# Patient Record
Sex: Female | Born: 1950 | Race: White | Hispanic: No | Marital: Married | Smoking: Former smoker
Health system: Southern US, Community
[De-identification: ages and names within clinical notes are randomized; demographics above are authoritative.]

## PROBLEM LIST (undated history)

## (undated) DIAGNOSIS — Z803 Family history of malignant neoplasm of breast: Secondary | ICD-10-CM

## (undated) DIAGNOSIS — C50411 Malignant neoplasm of upper-outer quadrant of right female breast: Secondary | ICD-10-CM

## (undated) DIAGNOSIS — F32A Depression, unspecified: Secondary | ICD-10-CM

## (undated) DIAGNOSIS — M1990 Unspecified osteoarthritis, unspecified site: Secondary | ICD-10-CM

## (undated) DIAGNOSIS — I10 Essential (primary) hypertension: Secondary | ICD-10-CM

## (undated) DIAGNOSIS — C50919 Malignant neoplasm of unspecified site of unspecified female breast: Secondary | ICD-10-CM

## (undated) DIAGNOSIS — K449 Diaphragmatic hernia without obstruction or gangrene: Secondary | ICD-10-CM

## (undated) DIAGNOSIS — Z1379 Encounter for other screening for genetic and chromosomal anomalies: Secondary | ICD-10-CM

## (undated) DIAGNOSIS — G4733 Obstructive sleep apnea (adult) (pediatric): Secondary | ICD-10-CM

## (undated) DIAGNOSIS — G4730 Sleep apnea, unspecified: Secondary | ICD-10-CM

## (undated) DIAGNOSIS — I4892 Unspecified atrial flutter: Secondary | ICD-10-CM

## (undated) DIAGNOSIS — C801 Malignant (primary) neoplasm, unspecified: Secondary | ICD-10-CM

## (undated) DIAGNOSIS — Z170 Estrogen receptor positive status [ER+]: Secondary | ICD-10-CM

## (undated) DIAGNOSIS — K219 Gastro-esophageal reflux disease without esophagitis: Secondary | ICD-10-CM

## (undated) DIAGNOSIS — F419 Anxiety disorder, unspecified: Secondary | ICD-10-CM

## (undated) DIAGNOSIS — I480 Paroxysmal atrial fibrillation: Secondary | ICD-10-CM

## (undated) DIAGNOSIS — E7800 Pure hypercholesterolemia, unspecified: Secondary | ICD-10-CM

## (undated) DIAGNOSIS — E0590 Thyrotoxicosis, unspecified without thyrotoxic crisis or storm: Secondary | ICD-10-CM

## (undated) DIAGNOSIS — G3184 Mild cognitive impairment, so stated: Secondary | ICD-10-CM

## (undated) DIAGNOSIS — E785 Hyperlipidemia, unspecified: Secondary | ICD-10-CM

## (undated) DIAGNOSIS — I471 Supraventricular tachycardia, unspecified: Secondary | ICD-10-CM

## (undated) DIAGNOSIS — F329 Major depressive disorder, single episode, unspecified: Secondary | ICD-10-CM

## (undated) DIAGNOSIS — L239 Allergic contact dermatitis, unspecified cause: Secondary | ICD-10-CM

## (undated) DIAGNOSIS — I4819 Other persistent atrial fibrillation: Secondary | ICD-10-CM

## (undated) DIAGNOSIS — I4891 Unspecified atrial fibrillation: Secondary | ICD-10-CM

## (undated) DIAGNOSIS — I495 Sick sinus syndrome: Secondary | ICD-10-CM

## (undated) DIAGNOSIS — I340 Nonrheumatic mitral (valve) insufficiency: Secondary | ICD-10-CM

## (undated) HISTORY — DX: Paroxysmal atrial fibrillation: I48.0

## (undated) HISTORY — DX: Anxiety disorder, unspecified: F41.9

## (undated) HISTORY — DX: Other persistent atrial fibrillation: I48.19

## (undated) HISTORY — DX: Depression, unspecified: F32.A

## (undated) HISTORY — PX: TONSILLECTOMY: SUR1361

## (undated) HISTORY — DX: Supraventricular tachycardia, unspecified: I47.10

## (undated) HISTORY — DX: Hyperlipidemia, unspecified: E78.5

## (undated) HISTORY — DX: Major depressive disorder, single episode, unspecified: F32.9

## (undated) HISTORY — DX: Unspecified atrial flutter: I48.92

## (undated) HISTORY — DX: Encounter for other screening for genetic and chromosomal anomalies: Z13.79

## (undated) HISTORY — DX: Nonrheumatic mitral (valve) insufficiency: I34.0

## (undated) HISTORY — PX: TUBAL LIGATION: SHX77

## (undated) HISTORY — DX: Obstructive sleep apnea (adult) (pediatric): G47.33

## (undated) HISTORY — DX: Diaphragmatic hernia without obstruction or gangrene: K44.9

## (undated) HISTORY — PX: APPENDECTOMY: SHX54

## (undated) HISTORY — DX: Estrogen receptor positive status (ER+): Z17.0

## (undated) HISTORY — DX: Sick sinus syndrome: I49.5

## (undated) HISTORY — DX: Malignant neoplasm of unspecified site of unspecified female breast: C50.919

## (undated) HISTORY — DX: Allergic contact dermatitis, unspecified cause: L23.9

## (undated) HISTORY — DX: Gastro-esophageal reflux disease without esophagitis: K21.9

## (undated) HISTORY — PX: CATARACT EXTRACTION: SUR2

## (undated) HISTORY — DX: Supraventricular tachycardia: I47.1

## (undated) HISTORY — DX: Morbid (severe) obesity due to excess calories: E66.01

## (undated) HISTORY — DX: Mild cognitive impairment of uncertain or unknown etiology: G31.84

## (undated) HISTORY — DX: Essential (primary) hypertension: I10

## (undated) HISTORY — DX: Family history of malignant neoplasm of breast: Z80.3

## (undated) HISTORY — DX: Malignant neoplasm of upper-outer quadrant of right female breast: C50.411

---

## 1994-08-12 HISTORY — PX: DILATION AND CURETTAGE OF UTERUS: SHX78

## 1999-03-02 ENCOUNTER — Ambulatory Visit (HOSPITAL_COMMUNITY): Admission: RE | Admit: 1999-03-02 | Discharge: 1999-03-02 | Payer: Self-pay | Admitting: Internal Medicine

## 1999-03-02 ENCOUNTER — Encounter: Payer: Self-pay | Admitting: Internal Medicine

## 1999-03-27 ENCOUNTER — Ambulatory Visit (HOSPITAL_COMMUNITY): Admission: RE | Admit: 1999-03-27 | Discharge: 1999-03-27 | Payer: Self-pay | Admitting: Psychiatry

## 2000-11-13 ENCOUNTER — Ambulatory Visit (HOSPITAL_COMMUNITY): Admission: RE | Admit: 2000-11-13 | Discharge: 2000-11-13 | Payer: Self-pay | Admitting: Internal Medicine

## 2000-11-13 ENCOUNTER — Encounter: Payer: Self-pay | Admitting: Internal Medicine

## 2002-02-05 ENCOUNTER — Emergency Department (HOSPITAL_COMMUNITY): Admission: EM | Admit: 2002-02-05 | Discharge: 2002-02-05 | Payer: Self-pay | Admitting: Emergency Medicine

## 2003-08-16 ENCOUNTER — Encounter: Admission: RE | Admit: 2003-08-16 | Discharge: 2003-09-07 | Payer: Self-pay | Admitting: Internal Medicine

## 2003-09-14 ENCOUNTER — Encounter: Admission: RE | Admit: 2003-09-14 | Discharge: 2003-09-14 | Payer: Self-pay | Admitting: Internal Medicine

## 2005-08-19 ENCOUNTER — Encounter: Admission: RE | Admit: 2005-08-19 | Discharge: 2005-08-19 | Payer: Self-pay | Admitting: Internal Medicine

## 2005-12-30 ENCOUNTER — Inpatient Hospital Stay (HOSPITAL_COMMUNITY): Admission: EM | Admit: 2005-12-30 | Discharge: 2006-01-02 | Payer: Self-pay | Admitting: Emergency Medicine

## 2005-12-30 ENCOUNTER — Encounter (INDEPENDENT_AMBULATORY_CARE_PROVIDER_SITE_OTHER): Payer: Self-pay | Admitting: Cardiology

## 2006-10-13 ENCOUNTER — Inpatient Hospital Stay (HOSPITAL_COMMUNITY): Admission: AD | Admit: 2006-10-13 | Discharge: 2006-10-18 | Payer: Self-pay | Admitting: Cardiology

## 2006-10-15 ENCOUNTER — Encounter (INDEPENDENT_AMBULATORY_CARE_PROVIDER_SITE_OTHER): Payer: Self-pay | Admitting: Cardiology

## 2007-01-12 ENCOUNTER — Encounter: Admission: RE | Admit: 2007-01-12 | Discharge: 2007-01-12 | Payer: Self-pay | Admitting: Endocrinology

## 2010-10-01 ENCOUNTER — Emergency Department (HOSPITAL_COMMUNITY): Payer: BC Managed Care – PPO

## 2010-10-01 ENCOUNTER — Emergency Department (HOSPITAL_COMMUNITY)
Admission: EM | Admit: 2010-10-01 | Discharge: 2010-10-01 | Disposition: A | Discharge status: Home / Self Care | Payer: BC Managed Care – PPO | Attending: Emergency Medicine | Admitting: Emergency Medicine

## 2010-10-01 DIAGNOSIS — M545 Low back pain, unspecified: Secondary | ICD-10-CM | POA: Insufficient documentation

## 2010-10-01 DIAGNOSIS — Z79899 Other long term (current) drug therapy: Secondary | ICD-10-CM | POA: Insufficient documentation

## 2010-10-01 DIAGNOSIS — M542 Cervicalgia: Secondary | ICD-10-CM | POA: Insufficient documentation

## 2010-10-01 DIAGNOSIS — R079 Chest pain, unspecified: Secondary | ICD-10-CM | POA: Insufficient documentation

## 2010-10-01 DIAGNOSIS — M25569 Pain in unspecified knee: Secondary | ICD-10-CM | POA: Insufficient documentation

## 2010-10-01 DIAGNOSIS — S8000XA Contusion of unspecified knee, initial encounter: Secondary | ICD-10-CM | POA: Insufficient documentation

## 2010-10-01 DIAGNOSIS — E0590 Thyrotoxicosis, unspecified without thyrotoxic crisis or storm: Secondary | ICD-10-CM | POA: Insufficient documentation

## 2010-10-01 DIAGNOSIS — I10 Essential (primary) hypertension: Secondary | ICD-10-CM | POA: Insufficient documentation

## 2010-10-01 DIAGNOSIS — I4891 Unspecified atrial fibrillation: Secondary | ICD-10-CM | POA: Insufficient documentation

## 2010-10-01 DIAGNOSIS — Y929 Unspecified place or not applicable: Secondary | ICD-10-CM | POA: Insufficient documentation

## 2010-10-01 DIAGNOSIS — Z7901 Long term (current) use of anticoagulants: Secondary | ICD-10-CM | POA: Insufficient documentation

## 2010-10-01 LAB — BASIC METABOLIC PANEL
BUN: 12 mg/dL (ref 6–23)
CO2: 27 mEq/L (ref 19–32)
Chloride: 102 mEq/L (ref 96–112)
Creatinine, Ser: 0.7 mg/dL (ref 0.4–1.2)
GFR calc non Af Amer: >60 mL/min (ref >60)
Potassium: 4.3 mEq/L (ref 3.5–5.1)

## 2010-10-01 LAB — CBC
HCT: 43.9 % (ref 36.0–46.0)
Hemoglobin: 14.7 g/dL (ref 12.0–15.0)
MCH: 30.6 pg (ref 26.0–34.0)
Platelets: 220 10*3/uL (ref 150–400)
RBC: 4.81 MIL/uL (ref 3.87–5.11)

## 2010-10-01 LAB — PROTIME-INR: INR: 1.85 — ABNORMAL HIGH (ref 0.00–1.49)

## 2010-11-11 HISTORY — PX: HYSTEROSCOPY: SHX211

## 2010-11-30 ENCOUNTER — Encounter (HOSPITAL_COMMUNITY)
Admission: RE | Admit: 2010-11-30 | Discharge: 2010-11-30 | Disposition: A | Discharge status: Home / Self Care | Payer: BC Managed Care – PPO | Source: Ambulatory Visit | Attending: Obstetrics & Gynecology | Admitting: Obstetrics & Gynecology

## 2010-11-30 LAB — CBC
HCT: 42.5 % (ref 36.0–46.0)
MCH: 30 pg (ref 26.0–34.0)
MCHC: 32.2 g/dL (ref 30.0–36.0)
MCV: 93.2 fL (ref 78.0–100.0)
Platelets: 231 10*3/uL (ref 150–400)
WBC: 11.8 10*3/uL — ABNORMAL HIGH (ref 4.0–10.5)

## 2010-11-30 LAB — BASIC METABOLIC PANEL
CO2: 28 mEq/L (ref 19–32)
Chloride: 102 mEq/L (ref 96–112)
GFR calc Af Amer: >60 mL/min (ref >60)
Potassium: 3.7 mEq/L (ref 3.5–5.1)

## 2010-11-30 LAB — PROTIME-INR: Prothrombin Time: 23.6 seconds — ABNORMAL HIGH (ref 11.6–15.2)

## 2010-12-04 ENCOUNTER — Ambulatory Visit (HOSPITAL_COMMUNITY)
Admission: RE | Admit: 2010-12-04 | Discharge: 2010-12-04 | Disposition: A | Discharge status: Home / Self Care | Payer: BC Managed Care – PPO | Source: Ambulatory Visit | Attending: Obstetrics & Gynecology | Admitting: Obstetrics & Gynecology

## 2010-12-04 ENCOUNTER — Other Ambulatory Visit: Payer: Self-pay | Admitting: Obstetrics & Gynecology

## 2010-12-04 DIAGNOSIS — Z01812 Encounter for preprocedural laboratory examination: Secondary | ICD-10-CM | POA: Insufficient documentation

## 2010-12-04 DIAGNOSIS — I4891 Unspecified atrial fibrillation: Secondary | ICD-10-CM | POA: Insufficient documentation

## 2010-12-04 DIAGNOSIS — I10 Essential (primary) hypertension: Secondary | ICD-10-CM | POA: Insufficient documentation

## 2010-12-04 DIAGNOSIS — N882 Stricture and stenosis of cervix uteri: Secondary | ICD-10-CM | POA: Insufficient documentation

## 2010-12-04 DIAGNOSIS — Z01818 Encounter for other preprocedural examination: Secondary | ICD-10-CM | POA: Insufficient documentation

## 2010-12-04 DIAGNOSIS — N950 Postmenopausal bleeding: Secondary | ICD-10-CM | POA: Insufficient documentation

## 2010-12-10 NOTE — Op Note (Signed)
NAME:  Courtney Dunlap, Courtney Dunlap               ACCOUNT NO.:  [id number]  MEDICAL RECORD NO.:  [id number]           PATIENT TYPE:  O  LOCATION:  [facility]                          [facility]  PHYSICIAN:  M. Leda Quail, MD  DATE OF BIRTH:  [birth year]  DATE OF PROCEDURE: DATE OF DISCHARGE:                              OPERATIVE REPORT   PREOPERATIVE DIAGNOSES: 32. A 60 year old gravida 2, para 2, married white female with     postmenopausal bleeding. 2. Stenotic cervix with failed endometrial biopsy in the office     attempt x2. 3. History of atrial fibrillation with current regular rate and     rhythm, on medications. 4. History of Coumadin use for prophylaxis, off x4 days. 5. Gastroesophageal reflux disease. 6. Hyperthyroidism. 7. Hypertension.  POSTOPERATIVE DIAGNOSES: 36. A 60 year old gravida 2, para 2, married white female with     postmenopausal bleeding. 2. Stenotic cervix with failed endometrial biopsy in the office     attempt x2. 3. History of atrial fibrillation with current regular rate and     rhythm, on medications. 4. History of Coumadin use for prophylaxis, off x4 days. 5. Gastroesophageal reflux disease. 6. Hyperthyroidism. 7. Hypertension.  PROCEDURE:  Hysteroscopy D and C.  SURGEON:  M. Leda Quail, MD  ASSISTANT:  OR staff.  ANESTHESIA:  Quillian Quince, MD oversaw the case.  LMA was used for anesthesia.  FINDINGS:  Fluffy endometrial tissue noted.  No polyps, no fibroids.  SPECIMEN:  Endometrial curettings sent to pathology.  ESTIMATED BLOOD LOSS:  Minimal.  FLUIDS:  700 mL of LR.  URINE OUTPUT:  50 mL drained with an I and O catheter at the beginning of the procedure.  FLUID DEFICIT:  45 mL of 1.5% glycine.  COMPLICATIONS:  None.  INDICATIONS:  Courtney Dunlap is a 60 year old, G2, P2 married white female, who has history of postmenopausal bleeding.  I attempted to do a biopsy in the office x2, but due to her stenotic cervix and discomfort I  had been unsuccessful.  The first time I attempted this was when she came in for her annual exam and on exam, I saw evidence of postmenopausal bleeding as well as per her history.  The biopsy was unsuccessful this day due to the stenosis and her discomfort, so I gave Cytotec and returned 2 days later and again due to her discomfort and the stenosis I was unable to dilate the cervix adequately enough to get endometrial biopsy in the office.  I recommended go ahead and doing this in the operating room despite some are elected by the patient as very comfortable if she was having postmenopausal bleeding, but some darkish blood noted at the cervical os on the last 2 exams that I have done on her.  Risks and benefits have been explained to the patient.  She consented and presented for the procedure today.  PROCEDURE:  The patient was taken to the operating room.  She was placed in supine position.  Anesthesia was administered by the anesthesia staff without difficulty.  She has running IV in place.  Informed consent was present on the chart.  Her legs were positioned in the low lithotomy position in Mertens stirrups with SCDs on her lower extremities bilaterally.  The legs were then lifted to the high lithotomy position. Time-out was performed.  The Betadine prep was used to prep the skin, perineum, inner thighs and vagina.  She was draped in normal sterile fashion.  A latex-free catheter was used to drain the bladder of all urine.  A bivalve speculum was placed in the vagina.  The anterior lip of the cervix was grasped with single-tooth tenaculum.  Using lacrimal duct probes, a #1 lacrimal duct probe was passed through the tiny opening in the cervix where a dark clot was passing through.  This was passed without difficulty.  Lacrimal duct probes were then used to dilate the endocervical canal until a #6 was reached.  Then, a #1 Hegar dilator was passed through the endocervical canal to the  fundus of the uterus.  The uterus sounds about 7 cm.  Hegar dilators were used and dilated up to a #8.  Then, 3.9-mm diagnostic hysteroscope was obtained. A 1.5% glycine was used to the hysteroscopic fluid for the procedure. This was passed through the endocervical canal and the endometrial cavity was noted.  There was a fluffy endometrial tissue noted.  The tubal ostia on the right is noted.  The tubal ostia on the left was scarred over.  Photo documentation made.  There was no polyp or evidence of fibroids.  The hysteroscope was removed and then #1 toothed curette was used to curette the cavity until a rough gritty texture was noted in all quadrants.  The hysteroscope was replaced through the endocervical canal and the endometrial cavity was visualized.  A thin-appearing endometrium at this time with a good curetting noted.  Photo documentation was made.  At this point, the hysteroscope was removed. There was 45 mL fluid deficit.  The tenaculum was removed from the anterior lip of the cervix.  A paracervical block of 1% Xylocaine without epinephrine was used to begin the procedure, 10 mL total was used.  There was no bleeding from the tenaculum site or from the sites of the paracervical block.  The speculum was removed at this point.  The prep was cleansed off the skin.  The legs positioned back in supine position.  Sponge, lap, needle and sponge counts were correct x2.  The patient awakened from anesthesia and taken to recovery room in stable condition.  The endometrial curettings were sent to pathology.     Lum Keas, MD     MSM/MEDQ  D:  12/04/2010  T:  12/05/2010  Job:  [id number]  Electronically Signed by Paul Half MD on 12/10/2010 09:50:31 AM

## 2010-12-28 NOTE — Discharge Summary (Signed)
NAME:  Courtney Dunlap, Courtney Dunlap               ACCOUNT NO.:  [id number]   MEDICAL RECORD NO.:  [id number]          PATIENT TYPE:  INP   LOCATION:  2920                         FACILITY:  [facility]   PHYSICIAN:  Armanda Magic, M.D.     DATE OF BIRTH:  1951/03/23   DATE OF ADMISSION:  10/13/2006  DATE OF DISCHARGE:  10/18/2006                               DISCHARGE SUMMARY   ADMISSION DIAGNOSES:  Palpitations secondary to atrial flutter with  rapid ventricular response.   DISCHARGE DIAGNOSES:  1. Status post conversion to normal sinus rhythm.  2. Hyperthyroidism.  3. Gastroesophageal reflux disease.  4. Hypertension.  5. Dyslipidemia.  6. History of shingles to the right upper back.  7. Systemic anticoagulation with Coumadin therapy.  8. Normal left ventricular function with an ejection fraction of 60%      via 2-D echocardiogram March 2008.  9. Allergy to codeine.  10.Intolerant to Daypro - angioedema.  11.Leukocytosis questionable etiology.   CONSULTATION:  Endocrinology by Dr. Adrian Prince.   PROCEDURE:  None.   HOSPITAL COURSE:  Courtney Dunlap is a 60 year old female with a known  history of atrial flutter with RVR.  She has been hospitalized in the  past for this problem and has been treated with IV Cardizem as well as  digoxin and Lopressor.  She has been maintaining normal sinus rhythm.  However complained of palpitations and was fitted with an event monitor  which revealed atrial flutter with 1:1 ratio with a heart rate up to the  280s.  She was admitted to the [facility] on October 13, 2006  secondary to atrial flutter with RVR.  She was started on IV Cardizem.  TSH, free T3 and free T4 were measured and endocrinology consult was  called secondary to severe hyperthyroidism.  She was continued on PTU as  well as digoxin and metoprolol during this admission.  She was also  continued on systemic anticoagulation with Coumadin therapy per pharmacy  protocol.  12-lead EKG upon  arrival revealed atrial flutter with RVR  with a ventricular rate of 128 beats per minute.  There was no evidence  of acute ST-segment/T-wave changes.  2-D echocardiogram during this  admission revealed normal LV systolic function with an EF of 50-60%.  There was no evidence of regional wall motion abnormalities.  Serial  cardiac enzymes were mildly elevated secondary to atrial flutter with a  peak troponin of 0.24 trending downward to 0.12.  BNP was nondiagnostic  at 283. During the admission, the patient converted to a normal rhythm  which was confirmed via EKG on October 17, 2006 with a ventricular rate of  65 beats per minute.  There was no evidence of acute ST-segment/T-wave  changes.  After being seen by endocrinology the patient's PTU was  increased to 200 mg three times daily.  The patient's IV Cardizem was  changed to 90 mg every 6 hours and then drip was discontinued.  The  patient was switched to oral Cardizem at 180 mg daily.  She was  discharged to home in stable condition on October 18, 2006 with  no  complaints of palpitations, dizziness, fatigue, chest pain, or shortness  of breath.   LABORATORY DATA:  Serial cardiac enzymes; CK total 52, 46, 36, and 29,  CK-MB 3.9, 3.4, 2.7, and 1.7.  Troponin I 0.33, 0.24, 0.12 x2.  BNP  283.0. Free T4 2.14.  TSH 0.006, T3 260.0, total T3 by RIA 172.  Magnesium 2.0. PT 25.4, INR 2.2, PTT 44, total bilirubin 0.7, alkaline  phosphatase 138, AST 33, ALT 48, total protein 7.0, serum albumin 3.5,  calcium 9.6, digoxin level 1.6. White blood count 13.0, hemoglobin 15.7,  hematocrit 46.2, platelets 328,000. Sodium 136, potassium 3.5, chloride  102, CO2 25, glucose 86, BUN 15, creatinine 0.76. Repeat free T4 1.75.  Repeat TSH 0.012.  Repeat T3 total by RIA 77.   X-RAYS:  None during this admission.   EKGS:  As stated in the hospital course.   CONDITION ON DISCHARGE:  Stable.   DISCHARGE MEDICATIONS:  1. Digoxin 0.25 mg daily.  2. Coumadin take  as directed.  3. Lopressor 100 mg twice daily.  This represented an increase in      dosage. A prescription was given with refills.  4. PTU 100 mg three times daily.  This represented an increase in      dosage. Prescription was given with refills.   DISCHARGE INSTRUCTIONS:  1. Discontinue Cardizem.  2. No restrictions upon activity.  3. Stop any activity that causes chest pain, shortness of breath,      dizziness, sweating, or excessive weakness.  4. Continue a low-sodium, low-fat, and low-cholesterol diet.   FOLLOW-UP APPOINTMENT:  1. Appointment has been scheduled at the [facility] for PT/INR check on October 20, 2006 at 9:30 a.m.  2. Follow-up appointment with Armanda Magic, M.D. on March 19 to 8:45      a.m.  The patient is scheduled to call [phone] if she is unable to      make the scheduled appointment.  3. Call Dr. Rinaldo Cloud office for an appointment in 2 weeks.      Tylene Fantasia, [place]      Armanda Magic, M.D.  Electronically Signed    RDM/MEDQ  D:  12/02/2006  T:  12/02/2006  Job:  [id number]   cc:   Jeannett Senior A. Evlyn Kanner, M.D.  Georgann Housekeeper, MD

## 2010-12-28 NOTE — Consult Note (Signed)
NAME:  Courtney Dunlap, Courtney Dunlap               ACCOUNT NO.:  [id number]   MEDICAL RECORD NO.:  [id number]            PATIENT TYPE:   LOCATION:                                 FACILITY:   PHYSICIAN:  Armanda Magic, M.D.          DATE OF BIRTH:   DATE OF CONSULTATION:  12/30/2005  DATE OF DISCHARGE:                                   CONSULTATION   CHIEF COMPLAINT:  Palpitations and tachyarrhythmias   HISTORY OF PRESENT ILLNESS:  This is a very pleasant 60 year old obese white  female with no previous cardiac history.  She does have a history of  hypertension.  She was in her usual state of health until the morning of May  19 when she, all of a sudden, started feeling very funny.  She says she had  taken an Osteo Bi-Flex tablet which she had not take for some time; and then  suddenly felt very very weak and very wiped out, and felt like she probably  needed to go lay down.  She then started noticing that her heart was  pounding, very fast.  She said she went and laid down, and felt better, but  every time she tried to get up, again, she would feel her heart racing in  her chest and would feel wiped out.  This occurred for 24 hours and then,  yesterday, presented to the emergency room for further evaluation.  She was  found to be in SVT; and initially was given adenosine 6 mg, although I do  not have any notes in the chart as to whether there was any change in her  rhythm from giving this.  She is now on Cardizem 20 mg/h drip still with  pretty persistent tachycardia.  We are now asked to consult.   The patient denies any chest pain or pressure.  No PND or orthopnea.  The  patient does give a history of a fall several weeks ago where she fell on  her left hip; and then shortly after that started having pain in her lower  legs in the calves, as well as some lower extremity edema.  She has not  noticed any palpable cords in her legs.  She has, though, noticed recently  some shortness of breath that  started with the palpitations.   ALLERGIES:  She had an allergic reaction to codeine which causes swelling.  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   CURRENT MEDICATIONS INCLUDE:  1.  Diovan 80 mg a day.  2.  Aspirin 81 mg a day.  3.  Osteo Bi-Flex p.r.n.  4.  She is also on, currently, an IV Cardizem drip at 20 mg per hour her   PAST MEDICAL HISTORY INCLUDES:  Hypertension.   PAST SURGICAL HISTORY:  Status post appendectomy.   FAMILY HISTORY:  Remarkable for her father dying of throat cancer.  She had  a brother who died of MI.   SOCIAL HISTORY:  She is married with 2 daughters.  She stopped smoking in  1980.  She only smoked about 6 cigarettes a day before that.  She has a  glass of wine in the evening.  She did not have any increase in alcohol  intake over the week, this past week end.  She is a Hospital doctor.   REVIEW OF SYSTEMS:  Otherwise other than what is stated in the HPI is  negative.   PHYSICAL EXAM:  VITAL SIGNS:  Her blood pressure is 102/71, pulse 131.  She  is afebrile, respirations 22.  O2 saturations is 100% on 2 liters.  GENERAL:  This is a well-developed, well-nourished obese white female in no  acute distress.  HEENT:  Benign.  NECK:  Supple without lymphadenopathy, carotid __________ +2, no bruits.  LUNGS:  Clear to auscultation throughout.  HEART:  Regular rate and rhythm and tachycardiac with no murmurs or gallops.  Normal S1-S2.  ABDOMEN:  Soft, nontender, nondistended.  Normoactive bowel sounds.  No  hepatosplenomegaly.  EXTREMITIES:  No edema.   LABS:  Sodium 139, potassium 3.8, chloride 106, bicarb 27, BUN 19,  creatinine 0.8, glucose 126.  White cell count 8.7, hemoglobin 12.5,  hematocrit 36.7, platelet count 257.  Point of care markers in the ER were  negative x1.  CPK total 45.  CPK-MB elevated at 4.7, troponin I elevated at  0.44.  EKG shows  atrial flutter with 2:1 conduction and atypical saw tooth  pattern in the inferior leads with negative P waves consistent with a right  atrial, atrial flutter.  EKG revealed marked ST abnormality.  Question of an  inferior subendocardial injury, but this most likely appearance due to  flutter waves.   ASSESSMENT:  1.  New onset of tachy palpitations with EKG consistent with atrial flutter      with rapid ventricular response despite a 2:1 block at 136 beats/minute.      Heart rate is difficult to control and currently on 20 mg/h of Cardizem.  2.  TSH is pending.  3.  Elevated CPK, MB, and troponin most likely secondary to rate flutter      with rapid ventricular response, although in the setting of patient's      who have been completely asymptomatic with no cardiac disease in the      past, to have sudden onset of tachypalpitations difficult to control      with IV medication, as well as elevated cardiac enzymes and shortness of      breath and a recent fall with lower extremity edema need to consider an      acute pulmonary embolism.  4.  Obesity.  5.  Hypertension.   PLAN:  1.  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  2.  Check a TSH.  3.  Check a 2-D echocardiogram once heart rate is controlled and less than      100 beats per minute.  4.  Will try 2.5 mg of IV Lopressor now; and then if blood pressure      tolerates another 2.5 mg IV to get heart rate under control.  If we      cannot get a decrease in her heart rate with this, we will do an IV Dig  load; and we will also continue to cycle cardiac enzymes.      Armanda Magic, M.D.  Electronically Signed     TT/MEDQ  D:  12/30/2005  T:  12/30/2005  Job:  [id number]

## 2010-12-28 NOTE — H&P (Signed)
NAME:  Courtney Dunlap, Courtney Dunlap               ACCOUNT NO.:  [id number]   MEDICAL RECORD NO.:  [id number]          PATIENT TYPE:  INP   LOCATION:  1823                         FACILITY:  [facility]   PHYSICIAN:  Hal T. Stoneking, M.D. DATE OF BIRTH:  06-10-1951   DATE OF ADMISSION:  12/29/2005  DATE OF DISCHARGE:                                HISTORY & PHYSICAL   IDENTIFYING DATA:  [name] is a very nice 60 year old white female with  hypertension followed by Dr. Donette Larry.  She has no prior cardiac history.  On  Dec 28, 2005, in the morning, she stated that she felt funny.  She stated  that she had taken an Osteo-BiFlex tablet which she had not taken for some  time.  She felt her heart racing.  She laid down and felt better but each  time that she tried to get up she again felt the racing feeling in her chest  and over the next 24 hours it was just more prominent.  She denied any chest  pain but did complain of feeling fatigue, dizzy and short of breath with  exertion.   PAST MEDICAL HISTORY:  She has an allergic reaction to CODEINE, causes  swelling.   CURRENT MEDICATIONS:  1.  Diovan 80 mg daily.  2.  Aspirin 81 mg daily.  3.  Osteo-BiFlex p.r.n.   PAST MEDICAL HISTORY:  Remarkable for hypertension.  No history of coronary  artery disease, stroke or diabetes.  She is status post appendectomy.   FAMILY HISTORY:  Remarkable for her father dying of throat cancer.  Brother  died of a MI.   SOCIAL HISTORY:  She is married and has two daughters.  She stopped smoking  in 1980.  She has a glass of wine in the evening.  She is a Biomedical scientist.   REVIEW OF SYSTEMS:  Occasional headache.  Her vision has not been as sharp  today.  No trouble hearing.  No trouble chewing or swallowing.  No cough.  No abdominal pain.   PHYSICAL EXAMINATION:  VITAL SIGNS:  Temperature 97.8, blood pressure  112/70, respiratory rate 21, pulse rate 130 and regular.  HEENT:  Pupils are equal, round, and reactive  to light.  Disks sharp.  Vascular normal.  TMs normal.  Oropharynx normal.  HEART:  Regular, tachycardia without murmur.  LUNGS:  Clear.  ABDOMEN:  Soft.  No hepatosplenomegaly or masses palpated.  No edema.  NEUROLOGICAL:  Normal.   EKG revealed occasional periods where she was in left bundle branch block  and then she had a more narrow complex but a regular tachycardia.  No  flutter waves noted.   LABORATORY DATA:  White count 8700, hemoglobin 12.5, platelet count 257,000.  Sodium 139, potassium 3.8, chloride 106, bicarbonate 27, BUN 19, creatinine  0.8, glucose 126.  CK 82.9, MB 1.5, troponin 0.05.   ASSESSMENT:  New onset tachycardia:  Difficult to know if this is atrial  flutter or supraventricular tachycardia.  We will give a dose of Adenocard  and reevaluate.  Plan admit to a telemetry, check TSH and proceed  according  to the results of the Adenocard 6 mg challenge.           ______________________________  Sunday Spillers. Pete Glatter, M.D.     HTS/MEDQ  D:  12/30/2005  T:  12/30/2005  Job:  [id number]

## 2010-12-28 NOTE — Discharge Summary (Signed)
NAME:  Courtney Dunlap, Courtney Dunlap               ACCOUNT NO.:  [id number]   MEDICAL RECORD NO.:  [id number]          PATIENT TYPE:  INP   LOCATION:  4712                         FACILITY:  [facility]   PHYSICIAN:  Georgann Housekeeper, MD      DATE OF BIRTH:  07/05/1951   DATE OF ADMISSION:  12/29/2005  DATE OF DISCHARGE:  01/02/2006                                 DISCHARGE SUMMARY   DISCHARGE DIAGNOSES:  1.  Atrial flutter, rapid ventricular response.  2.  Severe hyperthyroidism.  3.  Mildly elevated troponin secondary to rapid ventricular nature of atrial      flutter.  4.  Mildly elevated blood sugars secondary to steroids.  5.  No history of diabetes.  6.  History of hypertension.   DISCHARGE MEDICATIONS:  1.  Digoxin 0.25 daily.  2.  PTU 100 mg three times a day.  3.  Lopressor 50 mg twice a day.  4.  Coumadin as per 5 mg tablets as per the [facility].  5.  Lovenox injections until INR gets therapeutic between 2 and 3.  6.  Prednisone taper 20 mg for 5 days, 10 mg for 5 days, and 10 mg every      other day for 5 days, and then discontinue.  7.  No aspirin or any aspirin products.   Home health nursing with the Lovenox teaching.   FOLLOWUP:  Follow up with Jeannett Senior A. Evlyn Kanner, M.D., endocrinology in 1 weeks.  Follow up with Coumadin Clinic for INR and PT/INR check on May 25, which  will be tomorrow and dose adjusted.  Follow up with me in 2 weeks.   CONSULTATIONS:  1.  Cardiology, Armanda Magic, M.D.  2.  Endocrinology, Tera Mater. Evlyn Kanner, M.D.   PROCEDURE:  1.  Echocardiogram of the heart was normal.  2.  Ultrasound of thyroid normal size with small 8 mm nodules on the left.  3.  CT scan of the chest negative for PE.   HOSPITAL COURSE:  Problem 1.  The patient is 60 years old with history of  hypertension and admitted with rapid heart rate, felt palpitations and  dizzy.  Heart racing for almost close to 24 hours prior to coming to the  emergency room.  She was found to be at one point by  EMS close to 200 heart  rate.  She was given Endocardia in the ER, it briefly helped, but she went  back into atrial flutter on the morning.  She was started on Cardizem drip  and heparin.  Cardiology was consulted.  The patient maintained on the  Cardizem as well as IV Lopressor.  Digoxin was started.  Her heart rate got  controlled and she converted back to normal sinus rhythm the next 24 hours  and remained in normal sinus rhythm with a rate of 70-90 range.  The patient  was maintained on p.o. Lopressor and p.o. digoxin.  Lovenox was started and  Coumadin.  Echocardiogram showed no evidence of any blood clot and normal  atrial size and normal left ventricular function.  The patient had mildly  elevated  troponin peaked at 0.4, normal CPK and MB, thought to be rate-  related.  She hemodynamically remained stable.   Problem 2.  Hyperthyroidism.  The patient's thyroid test, TSH came back very  suppressed and a T3 and T4 was very elevated.  Her thyroid antibody was  positive.  Endocrinology was consulted and thought to be hyperthyroidism was  the cause of her atrial flutter.  She was started on IV Solu-Medrol and PTU  100 mg every 6 hours and Solu-Medrol 60 mg IV q.6 hours.  The patient's  heart rate remained stable.  She did not have any other signs of thyroid  crisis, as far as no fevers, no weakness, no sweating, and no hypertension.  She had ultrasound of the thyroid done which was unremarkable.  The  patient's steroid was tapered down to prednisone which will taper out in the  next weeks.  Her PTU will taper down slowly over next few weeks.  The  patient will follow with Dr. Evlyn Kanner, endocrinology.  As far as her blood  sugars mildly elevated because of the steroids and she will get on a sugar-  free diet.  No history of any diabetes in the past.  The patient will get  the Lovenox injection teaching and the [facility] Clinic with [facility]  will follow.  She will probably require a  short period of Coumadin since she  is maintaining a normal sinus rhythm.  Follow up with Dr. Evlyn Kanner in 7-10 days  to check her thyroid function tests again.      Georgann Housekeeper, MD  Electronically Signed     KH/MEDQ  D:  01/02/2006  T:  01/02/2006  Job:  [id number]   cc:   Jeannett Senior A. Evlyn Kanner, M.D.  Fax: [phone]   Armanda Magic, M.D.  Fax: [phone]

## 2010-12-28 NOTE — H&P (Signed)
NAME:  Courtney Dunlap, Courtney Dunlap               ACCOUNT NO.:  [id number]   MEDICAL RECORD NO.:  [id number]          PATIENT TYPE:  INP   LOCATION:  2920                         FACILITY:  [facility]   PHYSICIAN:  Armanda Magic, M.D.     DATE OF BIRTH:  [birth year]   DATE OF ADMISSION:  10/13/2006  DATE OF DISCHARGE:                              HISTORY & PHYSICAL   Ms. Courtney Dunlap is a 60 year old female with a known history of atrial  flutter with RVR.  She has been hospitalized in the past with this  problem and was treated with IV Cardizem, as well as digoxin and  Lopressor.  She chemically converted back to sinus rhythm and has been  doing well.  Over the past few days, she has noted palpitations and came  into the office for an EKG.  The EKG showed sinus rhythm, but we did put  an event monitor on her.  The event monitor does show atrial flutter and  she has had rates as high as a 1:1 ratio with a rate of 280 beats per  minute.  She states she feels tired.  She has palpitations and sometimes  feels dizzy.  She denies any chest pain, PND, orthopnea, syncope,  nausea, vomiting or diarrhea.   PAST MEDICAL HISTORY:  Includes:  1. Reflux.  2. Hypertension.  3. Hyperlipidemia.  4. Shingles to the right upper back.  5. Hyperthyroidism, treated by Dr. Evlyn Kanner and is currently on PTU.  6. Systemic anticoagulation.   A 2D echo in 2003 was normal.  She has also had a Cardiolite in the  recent past, which was reported as normal.   ALLERGIES:  1. CODEINE.  2. DAYPRO.  APPARENTLY DAYPRO CAUSES ANGIOEDEMA.   CURRENT MEDICATIONS:  1. Digoxin 0.25 mg daily.  2. Metoprolol 50 mg 2 tablets p.o. b.i.d.  3. PTU 50 mg 1/2 tablet daily.  4. Coumadin as directed.  5. Tylenol as needed.   FAMILY HISTORY:  Dad died at age 92 with throat cancer and pneumonia.  Mom died of renal failure, hypertension and stroke at age 33.   SOCIAL HISTORY:  She is married with 2 children.  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  No  smoking.  Occasional wine.  No elicit drug use.   PHYSICAL EXAMINATION:  VITAL SIGNS:  Respirations 14.  Pulse is 134.  Blood pressure is 130/80.  HEENT:  Grossly normal.  No carotid or subclavian bruits.  No JVD or  thyromegaly.  Sclerae are clear.  Conjunctivae normal.  Nares without  drainage.  CHEST:  Clear to auscultation bilaterally.  No wheezing or rhonchi.  HEART:  Regular, but tachycardic.  No gross murmur.  ABDOMEN:  Benign.  Good bowel sounds.  EXTREMITIES:  No peripheral edema palpable.  Faint PT pulses  bilaterally.  SKIN:  Warm and dry.  NEUROLOGICALLY:  She has a normal mood and affect.   ASSESSMENT/PLAN:  1. Atrial flutter with right ventricular response.  2. Hyperthyroidism, followed by Dr. Adrian Prince.  3. Hypertension.  4. Systemic anticoagulation.  5. Long term medication use.  6. ALLERGY TO CODEINE.  7. Apparent angioedema to [facility].   The patient was seen and examined by Dr. Armanda Magic.  She will need to  be admitted to the step down intensive care unit.  We will place her on  IV Cardizem and titrate this to ensure her heart rate remains below 110  beats per minute.  We have talked with Dr. Adrian Prince and we will  give her, in the emergency room, 200 mg of PTU, as well as Solu-Medrol  125 mg IV x1.  Routine lab work will be obtained, including thyroid  studies, cardiac studies.      Guy Franco, P.A.      Armanda Magic, M.D.  Electronically Signed    LB/MEDQ  D:  10/13/2006  T:  10/13/2006  Job:  [id number]   cc:   Jeannett Senior A. Evlyn Kanner, M.D.  Georgann Housekeeper, MD

## 2010-12-28 NOTE — Consult Note (Signed)
NAME:  Courtney Dunlap, Courtney Dunlap               ACCOUNT NO.:  [id number]   MEDICAL RECORD NO.:  [id number]           PATIENT TYPE:   LOCATION:                                 FACILITY:   PHYSICIAN:  Tera Mater. Evlyn Kanner, M.D. DATE OF BIRTH:  04-Feb-1951   DATE OF CONSULTATION:  12/30/2005  DATE OF DISCHARGE:                                   CONSULTATION   REASON FOR CONSULTATION:  Hyperthyroidism with extreme tachycardia, question  thyroid storm.   REQUESTING PHYSICIAN:  Dr. Georgann Housekeeper   [name] is a 60 year old white female admitted through the ER earlier  today with extreme tachycardia to a level of 285 beats per minute.  She had  been having weakness, orthostatic dizziness, and heart racing and came to  the emergency room for evaluation.  She had previously thought that it might  be due to the Osteo BiFlex tablet which she had been taking.  She has no  prior history of any thyroid disease personally.  She has a strong positive  family history with two aunts having thyroid surgery for goiter, brother and  two sisters also having thyroid dysfunction.  She has a positive weight loss  of 20 pounds over the last several weeks.  She has had heat intolerance,  more frequent bowel movements that are somewhat loose, palpitations, a  shortness of breath, dry, itchy eyes, and poor general energy.  Her sleep  has been somewhat disturbed.  She has no diplopia or pretibial changes.   SOCIAL HISTORY:  She is married with two daughters.  She has not smoked  since 1980.  She does have wine on a regular basis, but not to excess.   CURRENT MEDICATIONS:  1.  She is on IV Cardizem drip at 20 mg.  2.  She is also on Lopressor 5 mg IV q.6h.  3.  PTU 100 q.6h.  4.  Solu-Medrol 60 mg IV q.6h.  5.  She got IV digoxin 0.25 q.6h. for two doses and then 0.25 daily.  6.  She is on IV heparin drip as well.   REVIEW OF SYSTEMS:  She still notes a sense of palpitations.  Her breathing  is doing well.  She  __________ cardiac chest pain.  She has no eye symptoms.  Her appetite is good.  Her energy is slightly better.  She does note a  slight tremor.  She notes no headache at the present time.   LABORATORY DATA:  Sodium 139, potassium 3.8, chloride 106, CO2 27, BUN 19,  creatinine 0.8, glucose 126, albumin 3.2, SGPT 44, SGOT 31, alkaline  phosphatase 143.  White count 8700, hemoglobin 12.5, platelets 257,000, MCV  89.7.  A TSH was less than 0.04.  Additional thyroid testing is pending.  CT  of the chest was done with contrast dye used.  No pulmonary emboli were  seen.  There were liver cysts seen.   PHYSICAL EXAMINATION:  VITAL SIGNS:  Here in the stepdown unit a blood  pressure is 109/62, pulse 135, respirations 19, temperature 97.4.  GENERAL:  We have a healthy white female  sitting up eating dinner.  She is  in no distress.  HEENT:  Sclerae anicteric.  There is no exophthalmus, no lid lag, and  extraocular movements are intact with no evidence of diplopia or eye  movement limitation.  Face is generally symmetrical.  Oral mucous membranes  are moist.  NECK:  Supple.  I cannot appreciate any thyroid enlargement swallowing.  Neck is supple without bruits.  LUNGS:  Clear without wheezes, rales, or rhonchi.  No accessory muscles are  used.  HEART:  Tachycardic and regular.  I cannot appreciate a murmur.  ABDOMEN:  Soft, nontender with normoactive bowel sounds.  EXTREMITIES:  Strong distal pulses with no pretibial changes.  NEUROLOGIC:  The patient is awake, alert, mentating perfectly well.  Speech  is clear.  No resting tremor is present.  Thought patterns appear organized.  There is no evidence of hallucination or delusion.   In summary we have a 60 year old white female presenting with extreme  tachycardia in the setting of abnormal thyroid function testing.  She has no  pathopneumonic signs such as Graves' such as eye disease or pretibial  changes, although I do suspect she has had  underlying hyperthyroidism  causing this.  This could be early form of accelerated hyperthyroidism but  she really does not meet criteria for thyroid storm in that she has had no  hypo or hyperthermia, no significant blood pressure instability, and no  significant diarrhea.  Neurologically she is very intact at the present  time.  Given that she has had a recent dye load with the CT it is going to  be a while before we can do scan or uptake or other testing.  Thyroid  antibodies might be somewhat helpful, but will take a while to come back as  well.  An expanded thyroid panel has been ordered.  The current dosing  treatment including beta blocker therapy through an appropriate degree, the  calcium channel blocker, the PTU, and the steroids are all appropriate.  The  patient should be improving on a gradual basis based on appropriate  treatment that she is getting.  Her current heart rate in the 130s will need  to be slowly decreased with extra beta blocker as tolerated.  I will follow  along with you carefully in this interesting case.           ______________________________  Tera Mater. Evlyn Kanner, M.D.     SAS/MEDQ  D:  12/30/2005  T:  12/31/2005  Job:  [id number]

## 2010-12-28 NOTE — Consult Note (Signed)
NAME:  Courtney Dunlap, Bostyn               ACCOUNT NO.:  [id number]   MEDICAL RECORD NO.:  [id number]          PATIENT TYPE:  INP   LOCATION:  2920                         FACILITY:  [facility]   PHYSICIAN:  Tera Mater. Evlyn Kanner, M.D. DATE OF BIRTH:  06-02-1951   DATE OF CONSULTATION:  10/13/2006  DATE OF DISCHARGE:                                 CONSULTATION   ENDOCRINE CONSULTATION:   CONSULTATION REQUESTED BY:  Dr. Armanda Magic for help and management of  hyperthyroidism.   Ms. Courtney Dunlap is a 60 year old, white female who I have been familiar with  since May of 2007.  At that time she presented with chest pain and had  significant accelerated hyperthyroidism.  She did not have true thyroid  storm, but certainly very close to this situation.  She was treated  appropriately at that time with steroids, PTU, and beta blockers.  She  had atrial fibrillation at that time and has remained on Coumadin since  then.  I followed her up as an outpatient several times since this, with  her last visit with me on September 03, 2006.  Her PTU has been slowly  weaned down, and in fact, she was down to 25 mg once a day with a TSH of  0.21, T3 of 112, and Free T4 of 1.5.  She was tolerating this well and  doing well.  She had gained about 12 pounds and had significant appetite  increase with 201 pounds being her weight at that visit.  Blood pressure  at that time was 120/80 with pulse of 80.  She has had minimal palpable  thyroid tissue and she had an ultrasound showing just an 8-mm nodule.  She has been relatively healthy, other than this thyroid problem and an  appendectomy.  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  The patient now reports starting about last Tuesday, she  started feeling less well than usual.  She did have one loose stool on  one day, but not recently.  She had a slight scratchy throat for a  couple of days, but no severe symptoms of fever, chills, sweats,  headache or  other things to suggest a flu-like or significant viral or  bacterial illness.  She did have some palpitations and went in the end  of last week and had TSH checked which came back at 0.06.  A monitor  over the weekend showed atrial flutter with a 1:1 conduction with a  heart rate of 200.  She does note some orthostatic dizziness.  She has  had no nausea and vomiting.  She has no eye symptoms or local neck  symptoms.  She has had some tremor.  Sleep has been slightly disturbed.   On exam here in the ICU:  VITAL SIGNS:  Blood pressure is 122/84, pulse is 126 and fairly regular  on monitor.  She is on a Cardizem drip.  It should be noted respiration  is 20.  She has had no significant fever.  She is lying quietly in bed.  She is completely flat with no akisthesia.  EYES:  Extraocular movements are intact.  There is no lid lag or  exophthalmos.  Eyes are conjugant.  Oral mucous membranes are moist.  NECK:  Is supple.  Minimal palpable thyroid is present.  There is no  tenderness.  LUNGS:  Are clear without wheezes, rales or rhonchi.  No accessory  muscles in use.  HEART:  Is tachycardiac and regular.  ABDOMEN:  Is soft, nontender with normoactive bowel sounds.  EXTREMITIES:  Reveal pulses to be preserved.  No edema is present.  No  pretibial changes are present.  NEUROLOGIC:  Patient is awake, alert, mentating peripherally well.  Speech is clear.  Slight tremor is noted.   In summary, we have a 60 year old white female with known  hyperthyroidism, presenting with acceleration of her hyperthyroidism  with arrhythmia.  She is really not in thyroid storm since she does not  have fever, diarrhea or experience with hypo to hypertension.  She has  already received 200 mg of PTU and one dose of steroids which  should dramatically reduce her levels.  I recommend continuing 200 three  times a day while dealing with this arrhythmia.  The Cardizem drip is  appropriate and would continue a beta  blocker.  I am not really sure  what precipitated this, but I think she should do well with this  episode.           ______________________________  Tera Mater. Evlyn Kanner, M.D.     SAS/MEDQ  D:  10/13/2006  T:  10/13/2006  Job:  [id number]

## 2011-08-15 ENCOUNTER — Other Ambulatory Visit: Payer: Self-pay

## 2011-08-15 ENCOUNTER — Encounter: Payer: Self-pay | Admitting: Emergency Medicine

## 2011-08-15 ENCOUNTER — Emergency Department (HOSPITAL_COMMUNITY): Payer: BC Managed Care – PPO

## 2011-08-15 ENCOUNTER — Inpatient Hospital Stay (HOSPITAL_COMMUNITY)
Admission: EM | Admit: 2011-08-15 | Discharge: 2011-08-19 | DRG: 121 | Disposition: A | Discharge status: Home / Self Care | Payer: BC Managed Care – PPO | Attending: Cardiology | Admitting: Cardiology

## 2011-08-15 DIAGNOSIS — E0590 Thyrotoxicosis, unspecified without thyrotoxic crisis or storm: Secondary | ICD-10-CM | POA: Diagnosis present

## 2011-08-15 DIAGNOSIS — I4891 Unspecified atrial fibrillation: Secondary | ICD-10-CM

## 2011-08-15 DIAGNOSIS — R079 Chest pain, unspecified: Secondary | ICD-10-CM | POA: Diagnosis present

## 2011-08-15 DIAGNOSIS — R748 Abnormal levels of other serum enzymes: Secondary | ICD-10-CM

## 2011-08-15 DIAGNOSIS — I214 Non-ST elevation (NSTEMI) myocardial infarction: Secondary | ICD-10-CM

## 2011-08-15 DIAGNOSIS — Z7901 Long term (current) use of anticoagulants: Secondary | ICD-10-CM

## 2011-08-15 DIAGNOSIS — E876 Hypokalemia: Secondary | ICD-10-CM | POA: Diagnosis present

## 2011-08-15 DIAGNOSIS — G4733 Obstructive sleep apnea (adult) (pediatric): Secondary | ICD-10-CM | POA: Diagnosis present

## 2011-08-15 HISTORY — DX: Pure hypercholesterolemia, unspecified: E78.00

## 2011-08-15 HISTORY — DX: Essential (primary) hypertension: I10

## 2011-08-15 HISTORY — DX: Unspecified atrial fibrillation: I48.91

## 2011-08-15 HISTORY — DX: Thyrotoxicosis, unspecified without thyrotoxic crisis or storm: E05.90

## 2011-08-15 HISTORY — DX: Malignant (primary) neoplasm, unspecified: C80.1

## 2011-08-15 HISTORY — DX: Sleep apnea, unspecified: G47.30

## 2011-08-15 LAB — CBC
MCH: 30.4 pg (ref 26.0–34.0)
MCHC: 33.6 g/dL (ref 30.0–36.0)
MCV: 90.4 fL (ref 78.0–100.0)
Platelets: 206 10*3/uL (ref 150–400)
RDW: 12.9 % (ref 11.5–15.5)
WBC: 11.6 10*3/uL — ABNORMAL HIGH (ref 4.0–10.5)

## 2011-08-15 LAB — POCT I-STAT TROPONIN I

## 2011-08-15 LAB — POCT I-STAT, CHEM 8
Chloride: 107 mEq/L (ref 96–112)
Creatinine, Ser: 0.7 mg/dL (ref 0.50–1.10)
Glucose, Bld: 154 mg/dL — ABNORMAL HIGH (ref 70–99)
Hemoglobin: 14.3 g/dL (ref 12.0–15.0)
Potassium: 3 mEq/L — ABNORMAL LOW (ref 3.5–5.1)
Sodium: 143 mEq/L (ref 135–145)

## 2011-08-15 MED ORDER — SODIUM CHLORIDE 0.9 % IV BOLUS (SEPSIS)
1000.0000 mL | Freq: Once | INTRAVENOUS | Status: AC
  Administered 2011-08-15: 1000 mL via INTRAVENOUS

## 2011-08-15 MED ORDER — ADENOSINE 6 MG/2ML IV SOLN
INTRAVENOUS | Status: AC
  Filled 2011-08-15: qty 4

## 2011-08-15 MED ORDER — DILTIAZEM HCL 100 MG IV SOLR
5.0000 mg/h | INTRAVENOUS | Status: DC
  Administered 2011-08-15: 5 mg/h via INTRAVENOUS
  Filled 2011-08-15: qty 100

## 2011-08-15 NOTE — ED Notes (Signed)
PT resting with family at bedside; MD made aware pt is in sinus rhythm. Repeat EKG performed and shown to MD.

## 2011-08-15 NOTE — ED Notes (Signed)
About an hour ago pt reported she felt heart was racing; abnormal EKG showing RVR or SVT; pt has hx of afib; EMS gave 12 of adenosine at 2233. Currently pt in afib. Heart rate is irregular and 120 currently.

## 2011-08-16 ENCOUNTER — Encounter (HOSPITAL_COMMUNITY): Payer: Self-pay | Admitting: Cardiology

## 2011-08-16 ENCOUNTER — Other Ambulatory Visit: Payer: Self-pay

## 2011-08-16 DIAGNOSIS — R079 Chest pain, unspecified: Secondary | ICD-10-CM | POA: Diagnosis present

## 2011-08-16 DIAGNOSIS — E0590 Thyrotoxicosis, unspecified without thyrotoxic crisis or storm: Secondary | ICD-10-CM

## 2011-08-16 DIAGNOSIS — E785 Hyperlipidemia, unspecified: Secondary | ICD-10-CM | POA: Insufficient documentation

## 2011-08-16 DIAGNOSIS — I4891 Unspecified atrial fibrillation: Secondary | ICD-10-CM

## 2011-08-16 DIAGNOSIS — I214 Non-ST elevation (NSTEMI) myocardial infarction: Secondary | ICD-10-CM

## 2011-08-16 HISTORY — DX: Hyperlipidemia, unspecified: E78.5

## 2011-08-16 LAB — DIFFERENTIAL
Basophils Absolute: 0.1 10*3/uL (ref 0.0–0.1)
Basophils Relative: 1 % (ref 0–1)
Eosinophils Absolute: 0.2 10*3/uL (ref 0.0–0.7)
Eosinophils Relative: 2 % (ref 0–5)
Neutrophils Relative %: 62 % (ref 43–77)

## 2011-08-16 LAB — DIGOXIN LEVEL: Digoxin Level: 0.5 ng/mL — ABNORMAL LOW (ref 0.8–2.0)

## 2011-08-16 LAB — COMPREHENSIVE METABOLIC PANEL
ALT: 30 U/L (ref 0–35)
AST: 24 U/L (ref 0–37)
Albumin: 3.5 g/dL (ref 3.5–5.2)
Albumin: 3.4 g/dL — ABNORMAL LOW (ref 3.5–5.2)
Alkaline Phosphatase: 114 U/L (ref 39–117)
Calcium: 9.1 mg/dL (ref 8.4–10.5)
Calcium: 9.3 mg/dL (ref 8.4–10.5)
Chloride: 105 mEq/L (ref 96–112)
GFR calc Af Amer: >90 mL/min (ref >90)
GFR calc Af Amer: >90 mL/min (ref >90)
GFR calc non Af Amer: >90 mL/min (ref >90)
Potassium: 3.5 mEq/L (ref 3.5–5.1)
Sodium: 143 mEq/L (ref 135–145)
Total Bilirubin: 0.5 mg/dL (ref 0.3–1.2)
Total Protein: 7 g/dL (ref 6.0–8.3)

## 2011-08-16 LAB — CARDIAC PANEL(CRET KIN+CKTOT+MB+TROPI)
CK, MB: 5.3 ng/mL — ABNORMAL HIGH (ref 0.3–4.0)
Relative Index: INVALID (ref 0.0–2.5)
Relative Index: INVALID (ref 0.0–2.5)
Total CK: 75 U/L (ref 7–177)
Total CK: 70 U/L (ref 7–177)

## 2011-08-16 LAB — CBC
MCH: 30.8 pg (ref 26.0–34.0)
MCHC: 33.6 g/dL (ref 30.0–36.0)
MCV: 91.6 fL (ref 78.0–100.0)
Platelets: 214 10*3/uL (ref 150–400)
RBC: 4.29 MIL/uL (ref 3.87–5.11)
RDW: 13.2 % (ref 11.5–15.5)

## 2011-08-16 LAB — PROTIME-INR: Prothrombin Time: 23.2 seconds — ABNORMAL HIGH (ref 11.6–15.2)

## 2011-08-16 LAB — POCT I-STAT TROPONIN I: Troponin i, poc: 0.44 ng/mL (ref 0.00–0.08)

## 2011-08-16 MED ORDER — ASPIRIN EC 81 MG PO TBEC
81.0000 mg | DELAYED_RELEASE_TABLET | Freq: Every day | ORAL | Status: DC
  Administered 2011-08-17 – 2011-08-19 (×3): 81 mg via ORAL
  Filled 2011-08-16 (×3): qty 1

## 2011-08-16 MED ORDER — DIGOXIN 125 MCG PO TABS
250.0000 ug | ORAL_TABLET | Freq: Once | ORAL | Status: AC
  Administered 2011-08-16: 250 ug via ORAL
  Filled 2011-08-16 (×2): qty 1

## 2011-08-16 MED ORDER — METOPROLOL TARTRATE 50 MG PO TABS
50.0000 mg | ORAL_TABLET | Freq: Two times a day (BID) | ORAL | Status: DC
  Filled 2011-08-16 (×2): qty 1

## 2011-08-16 MED ORDER — SODIUM CHLORIDE 0.9 % IJ SOLN: 3.0000 mL | INTRAMUSCULAR | Status: DC | PRN

## 2011-08-16 MED ORDER — AMLODIPINE BESYLATE 5 MG PO TABS
5.0000 mg | ORAL_TABLET | Freq: Every day | ORAL | Status: DC
  Administered 2011-08-16 – 2011-08-19 (×4): 5 mg via ORAL
  Filled 2011-08-16 (×4): qty 1

## 2011-08-16 MED ORDER — ASPIRIN 81 MG PO TABS
162.0000 mg | ORAL_TABLET | Freq: Once | ORAL | Status: AC
  Administered 2011-08-16: 162 mg via ORAL
  Filled 2011-08-16: qty 2

## 2011-08-16 MED ORDER — ACETAMINOPHEN 325 MG PO TABS
650.0000 mg | ORAL_TABLET | ORAL | Status: DC | PRN
  Administered 2011-08-17 – 2011-08-19 (×2): 650 mg via ORAL
  Filled 2011-08-16 (×2): qty 2

## 2011-08-16 MED ORDER — ALPRAZOLAM 0.25 MG PO TABS
0.2500 mg | ORAL_TABLET | Freq: Three times a day (TID) | ORAL | Status: DC | PRN
  Administered 2011-08-16: 0.25 mg via ORAL
  Filled 2011-08-16 (×2): qty 1

## 2011-08-16 MED ORDER — POTASSIUM CHLORIDE CRYS ER 20 MEQ PO TBCR
40.0000 meq | EXTENDED_RELEASE_TABLET | Freq: Once | ORAL | Status: AC
  Administered 2011-08-16: 40 meq via ORAL
  Filled 2011-08-16: qty 2

## 2011-08-16 MED ORDER — SOTALOL HCL 80 MG PO TABS
80.0000 mg | ORAL_TABLET | Freq: Two times a day (BID) | ORAL | Status: DC
  Administered 2011-08-16 – 2011-08-19 (×6): 80 mg via ORAL
  Filled 2011-08-16 (×7): qty 1

## 2011-08-16 MED ORDER — ASPIRIN 81 MG PO CHEW: 324.0000 mg | CHEWABLE_TABLET | ORAL | Status: AC

## 2011-08-16 MED ORDER — SIMVASTATIN 40 MG PO TABS
40.0000 mg | ORAL_TABLET | Freq: Every day | ORAL | Status: DC
  Filled 2011-08-16: qty 1

## 2011-08-16 MED ORDER — SODIUM CHLORIDE 0.9 % IV SOLN: 250.0000 mL | INTRAVENOUS | Status: DC | PRN

## 2011-08-16 MED ORDER — NITROGLYCERIN 0.4 MG SL SUBL: 0.4000 mg | SUBLINGUAL_TABLET | SUBLINGUAL | Status: DC | PRN

## 2011-08-16 MED ORDER — DIGOXIN 250 MCG PO TABS
250.0000 ug | ORAL_TABLET | Freq: Every day | ORAL | Status: DC
  Filled 2011-08-16: qty 1

## 2011-08-16 MED ORDER — METOPROLOL TARTRATE 25 MG PO TABS
50.0000 mg | ORAL_TABLET | Freq: Once | ORAL | Status: AC
  Administered 2011-08-16: 50 mg via ORAL
  Filled 2011-08-16: qty 2

## 2011-08-16 MED ORDER — ROSUVASTATIN CALCIUM 10 MG PO TABS
10.0000 mg | ORAL_TABLET | Freq: Every day | ORAL | Status: DC
  Administered 2011-08-16 – 2011-08-18 (×3): 10 mg via ORAL
  Filled 2011-08-16 (×4): qty 1

## 2011-08-16 MED ORDER — SODIUM CHLORIDE 0.9 % IJ SOLN
3.0000 mL | Freq: Two times a day (BID) | INTRAMUSCULAR | Status: DC
  Administered 2011-08-16 – 2011-08-19 (×5): 3 mL via INTRAVENOUS

## 2011-08-16 MED ORDER — ONDANSETRON HCL 4 MG/2ML IJ SOLN: 4.0000 mg | Freq: Four times a day (QID) | INTRAMUSCULAR | Status: DC | PRN

## 2011-08-16 MED ORDER — METOPROLOL TARTRATE 25 MG PO TABS: 25.0000 mg | ORAL_TABLET | Freq: Two times a day (BID) | ORAL | Status: DC

## 2011-08-16 MED ORDER — WARFARIN SODIUM 7.5 MG PO TABS
7.5000 mg | ORAL_TABLET | Freq: Once | ORAL | Status: AC
  Administered 2011-08-16: 7.5 mg via ORAL
  Filled 2011-08-16 (×2): qty 1

## 2011-08-16 NOTE — Progress Notes (Signed)
Patient Name: Courtney Dunlap Date of Encounter: 08/16/2011    SUBJECTIVE: I reinterviewed the patient. Over the past 6 months she has had 3 or 4 episodes of atrial for ablation but not persisting longer than 5-10 minutes. Episode last night persisted for longer than those that she as noted previously. He was very similar to the episodes that she had 3 and 4 years ago when the thyroid was overactive.  TELEMETRY:  Normal sinus rhythm. Filed Vitals:   08/16/11 0832 08/16/11 1115 08/16/11 1200 08/16/11 1300  BP: 132/70 149/73 152/71 139/76  Pulse: 65 65 67 66  Temp:    97.6 F (36.4 C)  TempSrc:      Resp: 14 14 17 16   SpO2: 96% 97% 99% 96%   No intake or output data in the 24 hours ending 08/16/11 1642  LABS: Basic Metabolic Panel:  Basename 08/16/11 1445 08/15/11 2331 08/15/11 2315  NA 143 143 --  K 3.5 3.0* --  CL 106 107 --  CO2 28 -- 24  GLUCOSE 127* 154* --  BUN 9 12 --  CREATININE 0.74 0.70 --  CALCIUM 9.3 -- 9.1  MG 2.1 -- --  PHOS -- -- --   CBC:  Basename 08/16/11 1445 08/15/11 2331 08/15/11 2315  WBC 8.5 -- 11.6*  NEUTROABS 5.3 -- --  HGB 13.2 14.3 --  HCT 39.3 42.0 --  MCV 91.6 -- 90.4  PLT 214 -- 206   Cardiac Enzymes:  Basename 08/16/11 1445 08/16/11 0310  CKTOTAL 75 76  CKMB 5.3* 4.9*  CKMBINDEX -- --  TROPONINI 0.65* 0.92*    Physical Exam: Blood pressure 139/76, pulse 66, temperature 97.6 F (36.4 C), temperature source Oral, resp. rate 16, SpO2 96.00%. Weight change:    No abnormality on auscultation to  ASSESSMENT:  1. Paroxysmal atrial fibrillation with prolonged episode requiring admission resolved spontaneously. Increasing brief episodes of atrial fibrillation have been occurring for the past 6 months.  2. History of thyroid disease, hyperthyroidism.  3. Troponin elevation secondary to atrial fibrillation with rapid ventricular response. This will qualify as a non-ST elevation MI type II. I do not believe this patient has underlying  coronary disease and this is certainly not a type I non-ST elevation MI related to coronary plaque rupture.  4. Hypertension   Plan:  1. Will switch to a rhythm control treatment strategy by starting Betapace 80 mg by mouth twice a day and of no excessive bradycardia or issues, increase the dose to 120 mg twice a day after the first dose.  2. Discontinue metoprolol and digoxin.  3. Amlodipine 5 mg per day for hypertension  4. TSH  5. Continue Coumadin  6. Outpatient ischemic evaluation with nuclear perfusion study.  7. 48 hours of telemetry monitoring on the final Betapace dose before discharge.  8. Recheck potassium level in a.m. and supplement if necessary. She was taking furosemide intermittently as an outpatient without potassium supplementation.  Selinda Eon 08/16/2011, 4:42 PM

## 2011-08-16 NOTE — ED Notes (Signed)
Patient remains in heart and vascular,  New order received for xanax

## 2011-08-16 NOTE — ED Notes (Signed)
Family at bedside. 

## 2011-08-16 NOTE — ED Notes (Signed)
Patient is being transported to cardiovascular study.  Admitting MD paged to request medication for patient anxiety

## 2011-08-16 NOTE — ED Notes (Signed)
MD made aware of elevated labs.

## 2011-08-16 NOTE — ED Notes (Signed)
MD at bedside. 

## 2011-08-16 NOTE — ED Notes (Signed)
Troponin results 0.44 ng/mL given to Dr. Theodoro Kalata by B. Bing Plume, EMT

## 2011-08-16 NOTE — ED Notes (Signed)
Flow manager called regarding bed request for telemetry.

## 2011-08-16 NOTE — Progress Notes (Signed)
ANTICOAGULATION CONSULT NOTE - Initial Consult  Pharmacy Consult for coumadin Indication: afib  Allergies  Allergen Reactions  . Codeine Swelling    Vital Signs: Temp: 97.8 F (36.6 C) (01/04 1300) BP: 135/80 mmHg (01/04 1300) Pulse Rate: 59  (01/04 1300)  Labs:  Basename 08/16/11 0310 08/15/11 2331 08/15/11 2315  HGB -- 14.3 13.6  HCT -- 42.0 40.5  PLT -- -- 206  APTT -- -- --  LABPROT -- -- 21.7*  INR -- -- 1.85*  HEPARINUNFRC -- -- --  CREATININE -- 0.70 0.66  CKTOTAL 76 -- --  CKMB 4.9* -- --  TROPONINI 0.92* -- --    Medical History: Past Medical History  Diagnosis Date  . A-fib   . Hyperthyroidism     Medications:  Prescriptions prior to admission  Medication Sig Dispense Refill  . digoxin (LANOXIN) 0.25 MG tablet Take 250 mcg by mouth daily.        . metoprolol (LOPRESSOR) 100 MG tablet Take 50 mg by mouth 2 (two) times daily.        [name] warfarin (COUMADIN) 5 MG tablet Take 5 mg by mouth daily.          Assessment: 61 yo female here with afib and NSTEMI on coumadin PTA to continue while admitted. INR=1.85 and patient on coumadin 5mg /day at home.  Goal of Therapy:  INR 2-3   Plan:  -Will give coumadin 7.5mg  today and follow daily PT/INR  Benny Lennert 08/16/2011,2:22 PM

## 2011-08-16 NOTE — ED Provider Notes (Signed)
History     CSN: [id number]  Arrival date & time 08/15/11  2242   First MD Initiated Contact with Patient 08/15/11 2313      Chief Complaint  Patient presents with  . Chest Pain    (Consider location/radiation/quality/duration/timing/severity/associated sxs/prior treatment) The history is provided by the patient.   patient at home tonight cleaning her house and just sudden onset of palpitations with rapid heart rate and shortness of breath. She had some associated chest pain. Symptoms feel the same as previous history of rapid A. fib. She takes Coumadin, Lopressor and digoxin. She is followed by Dr. Mayford Knife. She has no known history of coronary artery disease. She has not had issue with atrial fibrillation for over year. Moderate in severity. Symptoms persistent in the ED. Chest pain is nonradiating from substernal area. Patient admits to recent increased stress at home. Her husband is currently hospitalized and she only got 3 hours of sleep last night. She denies any caffeine or tobacco use. She denies any stimulants. She states she takes all medications as prescribed and has not had any changes. She gets blood work every 3 months to check her thyroid but denies any recent fevers, chills, cough or cold. EMS was called and they gave 12 mg adenosine with no change in condition.  Past Medical History  Diagnosis Date  . A-fib     No past surgical history on file.  No family history on file.  History  Substance Use Topics  . Smoking status: Not on file  . Smokeless tobacco: Not on file  . Alcohol Use:     OB History    Grav Para Term Preterm Abortions TAB SAB Ect Mult Living                  Review of Systems  Constitutional: Negative for fever and chills.  HENT: Negative for neck pain and neck stiffness.   Eyes: Negative for pain.  Respiratory: Positive for shortness of breath.   Cardiovascular: Positive for chest pain and palpitations.  Gastrointestinal: Negative for  abdominal pain.  Genitourinary: Negative for dysuria.  Musculoskeletal: Negative for back pain.  Skin: Negative for rash.  Neurological: Negative for headaches.  All other systems reviewed and are negative.    Allergies  Codeine  Home Medications   Current Outpatient Rx  Name Route Sig Dispense Refill  . DIGOXIN 0.25 MG PO TABS Oral Take 250 mcg by mouth daily.      [name] METOPROLOL TARTRATE 100 MG PO TABS Oral Take 50 mg by mouth 2 (two) times daily.      . WARFARIN SODIUM 5 MG PO TABS Oral Take 5 mg by mouth daily.        BP 121/70  Pulse 77  Temp(Src) 97.5 F (36.4 C) (Oral)  Resp 17  SpO2 98%  Physical Exam  Constitutional: She is oriented to person, place, and time. She appears well-developed and well-nourished.  HENT:  Head: Normocephalic and atraumatic.  Eyes: Conjunctivae and EOM are normal. Pupils are equal, round, and reactive to light.  Neck: Trachea normal. Neck supple. No thyromegaly present.  Cardiovascular: S1 normal, S2 normal and normal pulses.     No systolic murmur is present   No diastolic murmur is present  Pulses:      Radial pulses are 2+ on the right side, and 2+ on the left side.       Rapid and irregular heart rate. Distal pulses equal and intact  Pulmonary/Chest: Effort  normal and breath sounds normal. She has no wheezes. She has no rhonchi. She has no rales. She exhibits no tenderness.  Abdominal: Soft. Normal appearance and bowel sounds are normal. There is no tenderness. There is no CVA tenderness and negative Murphy's sign.  Musculoskeletal:       BLE:s Calves nontender, no cords or erythema, negative Homans sign  Neurological: She is alert and oriented to person, place, and time. She has normal strength. No cranial nerve deficit or sensory deficit. GCS eye subscore is 4. GCS verbal subscore is 5. GCS motor subscore is 6.  Skin: Skin is warm and dry. No rash noted. She is not diaphoretic.  Psychiatric: Her speech is normal.       Cooperative  and appropriate    ED Course  Procedures (including critical care time)  Results for orders placed during the hospital encounter of 08/15/11  CBC      Component Value Range   WBC 11.6 (*) 4.0 - 10.5 (K/uL)   RBC 4.48  3.87 - 5.11 (MIL/uL)   Hemoglobin 13.6  12.0 - 15.0 (g/dL)   HCT 29.5  62.1 - 30.8 (%)   MCV 90.4  78.0 - 100.0 (fL)   MCH 30.4  26.0 - 34.0 (pg)   MCHC 33.6  30.0 - 36.0 (g/dL)   RDW 65.7  84.6 - 96.2 (%)   Platelets 206  150 - 400 (K/uL)  COMPREHENSIVE METABOLIC PANEL      Component Value Range   Sodium 139  135 - 145 (mEq/L)   Potassium 2.9 (*) 3.5 - 5.1 (mEq/L)   Chloride 105  96 - 112 (mEq/L)   CO2 24  19 - 32 (mEq/L)   Glucose, Bld 149 (*) 70 - 99 (mg/dL)   BUN 13  6 - 23 (mg/dL)   Creatinine, Ser 9.52  0.50 - 1.10 (mg/dL)   Calcium 9.1  8.4 - 84.1 (mg/dL)   Total Protein 7.1  6.0 - 8.3 (g/dL)   Albumin 3.5  3.5 - 5.2 (g/dL)   AST 24  0 - 37 (U/L)   ALT 33  0 - 35 (U/L)   Alkaline Phosphatase 130 (*) 39 - 117 (U/L)   Total Bilirubin 0.5  0.3 - 1.2 (mg/dL)   GFR calc non Af Amer >90  >90 (mL/min)   GFR calc Af Amer >90  >90 (mL/min)  PROTIME-INR      Component Value Range   Prothrombin Time 21.7 (*) 11.6 - 15.2 (seconds)   INR 1.85 (*) 0.00 - 1.49   POCT I-STAT, CHEM 8      Component Value Range   Sodium 143  135 - 145 (mEq/L)   Potassium 3.0 (*) 3.5 - 5.1 (mEq/L)   Chloride 107  96 - 112 (mEq/L)   BUN 12  6 - 23 (mg/dL)   Creatinine, Ser 3.24  0.50 - 1.10 (mg/dL)   Glucose, Bld 401 (*) 70 - 99 (mg/dL)   Calcium, Ion 0.27  2.53 - 1.32 (mmol/L)   TCO2 24  0 - 100 (mmol/L)   Hemoglobin 14.3  12.0 - 15.0 (g/dL)   HCT 66.4  40.3 - 47.4 (%)  POCT I-STAT TROPONIN I      Component Value Range   Troponin i, poc 0.05  0.00 - 0.08 (ng/mL)   Comment 3           DIGOXIN LEVEL      Component Value Range   Digoxin Level 0.5 (*) 0.8 - 2.0 (ng/mL)  POCT I-STAT TROPONIN I      Component Value Range   Troponin i, poc 0.44 (*) 0.00 - 0.08 (ng/mL)    Comment NOTIFIED PHYSICIAN     Comment 3            Dg Chest Portable 1 View  08/15/2011  *RADIOLOGY REPORT*  Clinical Data: Shortness of breath, tachycardia.  PORTABLE CHEST - 1 VIEW  Comparison: 08/01/2011  Findings: Cardiomegaly with mild vascular congestion.  No confluent opacities, effusions or edema.  No acute bony abnormality.  IMPRESSION: Cardiomegaly, mild venous congestion.  Original Report Authenticated By: Cyndie Chime, M.D.        Date: 08/16/2011  Rate: 141  Rhythm: atrial fibrillation  QRS Axis: normal  Intervals: normal  ST/T Wave abnormalities: nonspecific ST changes  Conduction Disutrbances:none  Narrative Interpretation:   Old EKG Reviewed: changes noted  CRITICAL CARE Performed by: Keiarah Orlowski   Total critical care time: 40  Critical care time was exclusive of separately billable procedures and treating other patients.  Critical care was necessary to treat or prevent imminent or life-threatening deterioration.  Critical care was time spent personally by me on the following activities: development of treatment plan with patient and/or surrogate as well as nursing, discussions with consultants, evaluation of patient's response to treatment, examination of patient, obtaining history from patient or surrogate, ordering and performing treatments and interventions, ordering and review of laboratory studies, ordering and review of radiographic studies, pulse oximetry and re-evaluation of patient's condition. IV Cardizem drip  Repeat EKG at 2344 shows conversion to normal sinus rhythm with a rate of 82. No ST changes.  Rapid A. Fib converted with diltiazem. Cardiology consultation obtained. Case discussed as above with Dr. Mayford Knife. She recommends replace potassium. She recommends three-hour troponin. If normal can be discharged home to followup in clinic next week.  3:09 AM Repeat troponin elevated. A consult to Dr. Mayford Knife again and she is requesting hospitalist  admission. I felt this would not be appropriate. She then recommended repeat troponin and CK-MB. I ordered those labs and provided aspirin. Patient resting comfortably at this time without pain remains in normal sinus rhythm.  4:43 AM Repeat labs reviewed again with DR Mayford Knife who agrees to admit.   MDM   Rapid A. fib with elevated cardiac enzymes. Pain improved with rate control and conversion to normal sinus rhythm.        Sunnie Nielsen, MD 08/16/11 [phone]

## 2011-08-16 NOTE — Progress Notes (Signed)
Visited with pt and her daughter.  She is concerned about her husband and anxious that she does not know what is going on with her own health.  Offered emotional support.  Thornell Sartorius 12:23 PM   08/16/11 1200  Clinical Encounter Type  Visited With Patient and family together  Visit Type Initial  Referral From Nurse  Spiritual Encounters  Spiritual Needs Emotional

## 2011-08-16 NOTE — Progress Notes (Signed)
  Echocardiogram 2D Echocardiogram has been performed.  Cathie Beams Deneen 08/16/2011, 10:14 AM

## 2011-08-16 NOTE — H&P (Addendum)
Admit date: 08/15/2011 Referring Physician Dr. Dierdre Highman Primary Cardiologist Dr. Armanda Magic Chief complaint/reason for admission:  Atrial fibrillation with RVR and chest pain  HPI: This is a 61yo WF with history of PAF and hyperthyroidism who presented to the ER with complaints of palpitations and chest pain.  Last evening around 10pm after getting home from seeing her husband at the hospital, she developed sudden onset of palpitations after trying to flip over a queen size mattress.  She became dizzy and presyncopal and called EMS.  On arrival in ER her HR was 200bpm with ST depression and she was complaining of chest pain.  She was given IV Cardizem with conversion to NSR.  Once she converted to NSR her CP resolved.  She complained of SOB but no diaphoresis.  She described the CP as a pressure sensation with no radiation but did have tingling in her right arm.  Currently she feels fine.    PMH:    Past Medical History  Diagnosis Date  . A-fib   . Hyperthyroidism Obstructive sleep apnea on CPAP     PSH:    Past Surgical History  Procedure Date  . Appendectomy   . Dilation and curettage of uterus     ALLERGIES:   Codeine  Prior to Admit Meds:   Warfarin as directed Metoprolol Tartrate 100mg  1/2 tablet BID Digoxin 0.25mg  daily  Family HX:   Mother died of kidney failure           Father died of emphysema           Sister with PPM           Brother with history of MI Social HX:    History   Social History  . Marital Status: Married    Spouse Name: N/A    Number of Children: 2  . Years of Education: N/A   Occupational History  .  [employer]   Social History Main Topics  . Smoking status: Former Smoker -- 4 years    Types: Cigarettes  . Smokeless tobacco: Not on file  . Alcohol Use: No  . Drug Use: No  . Sexually Active:    Other Topics Concern  . Not on file   Social History Narrative  . No narrative on file     ROS:  All 11 ROS were addressed and are  negative except what is stated in the HPI  PHYSICAL EXAM Filed Vitals:   08/16/11 0300  BP: 126/67  Pulse: 66  Temp:   Resp: 15   General: Well developed, well nourished, in no acute distress Head: Eyes PERRLA, No xanthomas.   Normal cephalic and atramatic  Lungs:   Clear bilaterally to auscultation and percussion. Heart:   HRRR S1 S2 Pulses are 2+ & equal.            No carotid bruit. No JVD.  No abdominal bruits. No femoral bruits. Abdomen: Bowel sounds are positive, abdomen soft and non-tender without masses  Extremities:   No clubbing, cyanosis or edema.  DP +1 Neuro: Alert and oriented X 3. Psych:  Good affect, responds appropriately   Labs:   Lab Results  Component Value Date   WBC 11.6* 08/15/2011   HGB 14.3 08/15/2011   HCT 42.0 08/15/2011   MCV 90.4 08/15/2011   PLT 206 08/15/2011    Lab 08/15/11 2331 08/15/11 2315  NA 143 --  K 3.0* --  CL 107 --  CO2 -- 24  BUN 12 --  CREATININE 0.70 --  CALCIUM -- 9.1  PROT -- 7.1  BILITOT -- 0.5  ALKPHOS -- 130*  ALT -- 33  AST -- 24  GLUCOSE 154* --   Lab Results  Component Value Date   CKTOTAL 76 08/16/2011   CKMB 4.9* 08/16/2011   TROPONINI 0.92* 08/16/2011   No results found for this basename: PTT   Lab Results  Component Value Date   INR 1.85* 08/15/2011   INR 2.09 COUMADIN THERAPY* 11/30/2010   INR 1.85* 10/01/2010         Radiology:  *RADIOLOGY REPORT*  Clinical Data: Shortness of breath, tachycardia.  PORTABLE CHEST - 1 VIEW  Comparison: 08/01/2011  Findings: Cardiomegaly with mild vascular congestion. No confluent  opacities, effusions or edema. No acute bony abnormality.  IMPRESSION:  Cardiomegaly, mild venous congestion.  Original Report Authenticated By: Cyndie Chime, M.D.   EKG:  Atrial fibrillation with RVR and ST depression in lateral leads, EKG now shows NSR with no ST changes  ASSESSMENT:  1.  Atrial fibrillation with RVR now in NSR 2.  Chest pain secondary to #1 with ST depression during afib  and positive cardiac markers 3.  NSTEMI type II due to demand ischemia from rapid heart rate 4.  Systemic anticoagulation 5.  Obstructive sleep apnea on CPAP 6.  Hyperthyroidism followed by Dr. Evlyn Kanner   7.  Hypokalemia  PLAN:   1.  Admit to tele bed 2.  ASA 3.  NPO  4.  2D echo in am - if LVF normal and enzymes do not increase further then consider Lexiscan cardiolyte prior to d/c but if LVF decreased then cath 5.  Continue Lopressor/dig/warfarin 7.  Replete potassium - done in ER 8.  BMET in am     Quintella Reichert, MD  08/16/2011  5:56 AM

## 2011-08-16 NOTE — ED Notes (Signed)
Patient has returned from vascular.  Patient up to bathroom with assistance.  Medications ordered from pharmacy.

## 2011-08-17 ENCOUNTER — Other Ambulatory Visit: Payer: Self-pay

## 2011-08-17 DIAGNOSIS — I214 Non-ST elevation (NSTEMI) myocardial infarction: Principal | ICD-10-CM

## 2011-08-17 DIAGNOSIS — I4891 Unspecified atrial fibrillation: Secondary | ICD-10-CM

## 2011-08-17 LAB — BASIC METABOLIC PANEL
BUN: 11 mg/dL (ref 6–23)
Chloride: 105 mEq/L (ref 96–112)
GFR calc Af Amer: >90 mL/min (ref >90)
GFR calc non Af Amer: >90 mL/min (ref >90)
Glucose, Bld: 103 mg/dL — ABNORMAL HIGH (ref 70–99)
Potassium: 4 mEq/L (ref 3.5–5.1)
Sodium: 140 mEq/L (ref 135–145)

## 2011-08-17 LAB — CARDIAC PANEL(CRET KIN+CKTOT+MB+TROPI)
CK, MB: 3.5 ng/mL (ref 0.3–4.0)
Relative Index: INVALID (ref 0.0–2.5)
Total CK: 60 U/L (ref 7–177)
Troponin I: 0.42 ng/mL (ref <0.30)

## 2011-08-17 LAB — PROTIME-INR: Prothrombin Time: 24.2 seconds — ABNORMAL HIGH (ref 11.6–15.2)

## 2011-08-17 MED ORDER — WARFARIN SODIUM 5 MG PO TABS
5.0000 mg | ORAL_TABLET | Freq: Every day | ORAL | Status: DC
  Administered 2011-08-17 – 2011-08-18 (×2): 5 mg via ORAL
  Filled 2011-08-17 (×3): qty 1

## 2011-08-17 NOTE — Progress Notes (Signed)
   Patient of Dr. Armanda Magic with [facility] cardiology.  Subjective: Patient without chest pain or palpitations. No breathlessness at rest.   Objective: Temp:  [98 F (36.7 C)] 98 F (36.7 C) (01/05 0555) Pulse Rate:  [60-68] 60  (01/05 0555) Resp:  [16-18] 16  (01/05 0555) BP: (121-152)/(70-90) 145/90 mmHg (01/05 1024) SpO2:  [96 %-97 %] 97 % (01/05 0555)  Telemetry - Sinus rhythm and sinus bradycardia, no pauses.  Exam -   General - NAD.  Lungs - Clear to auscultation, nonlabored.  Cardiac - Regular rate and rhythm, no S3.  Extremities - Nonpitting.  Testing -   Lab Results  Component Value Date   WBC 8.5 08/16/2011   HGB 13.2 08/16/2011   HCT 39.3 08/16/2011   MCV 91.6 08/16/2011   PLT 214 08/16/2011    Lab Results  Component Value Date   CREATININE 0.70 08/17/2011   BUN 11 08/17/2011   NA 140 08/17/2011   K 4.0 08/17/2011   CL 105 08/17/2011   CO2 26 08/17/2011    Lab Results  Component Value Date   CKTOTAL 60 08/16/2011   CKMB 3.5 08/16/2011   TROPONINI 0.42* 08/16/2011    ECG - Reviewed, sinus bradycardia, normal QTC.  Echocardiogram  - Left ventricle: The cavity size was normal. Wall thickness was normal. Systolic function was normal. The estimated ejection fraction was in the range of 60% to 65%. Features are consistent with a pseudonormal left ventricular filling pattern, with concomitant abnormal relaxation and increased filling pressure (grade 2 diastolic dysfunction). Doppler parameters are consistent with high ventricular filling pressure. - Right ventricle: Systolic pressure was mildly to moderately increased. - Atrial septum: No defect or patent foramen ovale was identified. - Pulmonary arteries: PA peak pressure: 44mm Hg (S).   Current Medications    . amLODipine  5 mg Oral Daily  . aspirin  324 mg Oral NOW  . aspirin EC  81 mg Oral Daily  . potassium chloride  40 mEq Oral Once  . rosuvastatin  10 mg Oral q1800  . sodium chloride  3 mL Intravenous  Q12H  . sotalol  80 mg Oral Q12H  . warfarin  5 mg Oral q1800  . warfarin  7.5 mg Oral ONCE-1800  . DISCONTD: digoxin  250 mcg Oral Daily  . DISCONTD: metoprolol  50 mg Oral BID  . DISCONTD: metoprolol  25 mg Oral BID  . DISCONTD: simvastatin  40 mg Oral q1800   Assessment:  1. Paroxysmal atrial fibrillation. I reviewed Dr. Michaelle Copas note with plan to initiate Betapace, continue Coumadin. She will be observed in the hospital for now. Maintaining sinus rhythm with bradycardia noted.  2. Hyperthyroidism. Recent TSH normal.  3. NSTEMI, likely type II in the setting of rapid atrial fibrillation. Dr. Katrinka Blazing has changed plan from inpatient Myoview to outpatient assessment following rhythm management.  4. Hypertension, adjusting medications.  Plan:  I reviewed the plan with the patient. Continue present dose of sotalol at 80 mg every 12 hours, watching heart rate closely. ECG in the morning. She is therapeutic on Coumadin as well. Likely she will remain in the hospital over the weekend, and if stable, potential discharge first of the week with outpatient Myoview per [facility] cardiology.  Jonelle Sidle, M.D., F.A.C.C.

## 2011-08-17 NOTE — Progress Notes (Signed)
ANTICOAGULATION CONSULT NOTE - Follow Up Consult  Pharmacy Consult for coumadin  Indication: afib  Allergies  Allergen Reactions  . Codeine Swelling    Vital Signs: Temp: 98 F (36.7 C) (01/05 0555) BP: 145/90 mmHg (01/05 1024) Pulse Rate: 60  (01/05 0555)  Labs:  Alvira Philips 08/17/11 0545 08/16/11 2336 08/16/11 1803 08/16/11 1445 08/15/11 2331 08/15/11 2315  HGB -- -- -- 13.2 14.3 --  HCT -- -- -- 39.3 42.0 40.5  PLT -- -- -- 214 -- 206  APTT -- -- -- -- -- --  LABPROT 24.2* -- -- 23.2* -- 21.7*  INR 2.13* -- -- 2.02* -- 1.85*  HEPARINUNFRC -- -- -- -- -- --  CREATININE 0.70 -- -- 0.74 0.70 --  CKTOTAL -- 60 70 75 -- --  CKMB -- 3.5 4.4* 5.3* -- --  TROPONINI -- 0.42* 0.52* 0.65* -- --   CrCl is unknown because there is no height on file for the current visit.   Medications:  Scheduled:    . amLODipine  5 mg Oral Daily  . aspirin  324 mg Oral NOW  . aspirin EC  81 mg Oral Daily  . potassium chloride  40 mEq Oral Once  . rosuvastatin  10 mg Oral q1800  . sodium chloride  3 mL Intravenous Q12H  . sotalol  80 mg Oral Q12H  . warfarin  7.5 mg Oral ONCE-1800  . DISCONTD: digoxin  250 mcg Oral Daily  . DISCONTD: metoprolol  50 mg Oral BID  . DISCONTD: metoprolol  25 mg Oral BID  . DISCONTD: simvastatin  40 mg Oral q1800    Assessment: 61 yo female here with afib and NSTEMI on coumadin PTA to continue while admitted. INR=2.13 and patient on coumadin 5mg /day at home.  Goal of Therapy:  INR 2-3   Plan:  -Will continue coumadin at 5mg /day with INR MWF only  Benny Lennert 08/17/2011,12:23 PM

## 2011-08-17 NOTE — Progress Notes (Signed)
CARDIAC REHAB PHASE I   PRE:  Rate/Rhythm: 52 SB  BP:  Supine: 122/70  Sitting:   Standing:    SaO2: 96 RA  MODE:  Ambulation: 200 ft   POST:  Rate/Rhythem: 74 SR  BP:  Supine: 122/76  Sitting:   Standing:    SaO2: 98 RA 1340-1415 Assisted X 1 to ambulate. Gait steady, slow pace. Walked 200 ft only c/o was of feeling tired. HR after walk 74. Back to bed after walk with call light in reach.  Beatrix Fetters

## 2011-08-18 ENCOUNTER — Other Ambulatory Visit: Payer: Self-pay

## 2011-08-18 DIAGNOSIS — R748 Abnormal levels of other serum enzymes: Secondary | ICD-10-CM

## 2011-08-18 LAB — CBC
MCH: 30.3 pg (ref 26.0–34.0)
Platelets: 241 10*3/uL (ref 150–400)
RBC: 4.56 MIL/uL (ref 3.87–5.11)
RDW: 12.9 % (ref 11.5–15.5)
WBC: 10.1 10*3/uL (ref 4.0–10.5)

## 2011-08-18 NOTE — Progress Notes (Signed)
   Patient of Traci Turner with [facility] cardiology.  Subjective: Patient up in chair eating lunch. No chest pain or palpitations.   Objective: Temp:  [98 F (36.7 C)-98.7 F (37.1 C)] 98.2 F (36.8 C) (01/06 0600) Pulse Rate:  [57-60] 60  (01/06 0600) Resp:  [14-18] 18  (01/06 0600) BP: (125-133)/(74-78) 128/78 mmHg (01/06 1032) SpO2:  [94 %-96 %] 94 % (01/06 0600)  Telemetry - Sinus rhythm, some bradycardia to the high 40s, no pauses.  Exam -   General - NAD.   Lungs - Clear to auscultation, nonlabored.   Cardiac - Regular rate and rhythm, no S3.   Extremities - Nonpitting.   Testing -   Lab Results  Component Value Date   WBC 10.1 08/18/2011   HGB 13.8 08/18/2011   HCT 41.4 08/18/2011   MCV 90.8 08/18/2011   PLT 241 08/18/2011    Lab Results  Component Value Date   CREATININE 0.70 08/17/2011   BUN 11 08/17/2011   NA 140 08/17/2011   K 4.0 08/17/2011   CL 105 08/17/2011   CO2 26 08/17/2011    Lab Results  Component Value Date   CKTOTAL 60 08/16/2011   CKMB 3.5 08/16/2011   TROPONINI 0.42* 08/16/2011    ECG - Sinus rhythm, QTC stable.   Current Medications    . amLODipine  5 mg Oral Daily  . aspirin  324 mg Oral NOW  . aspirin EC  81 mg Oral Daily  . rosuvastatin  10 mg Oral q1800  . sodium chloride  3 mL Intravenous Q12H  . sotalol  80 mg Oral Q12H  . warfarin  5 mg Oral q1800     Assessment:  1. Paroxysmal atrial fibrillation. I reviewed Dr. Michaelle Copas note with plan to initiate Betapace, continue Coumadin. She will be observed in the hospital for now. Maintaining sinus rhythm with bradycardia noted, but no pauses. ECG is stable.  2. Hyperthyroidism. Recent TSH normal.   3. NSTEMI, likely type II in the setting of rapid atrial fibrillation. Dr. Katrinka Blazing has changed plan from inpatient Myoview to outpatient assessment following rhythm management.   4. Hypertension, adjusting medications.  Plan:  Spoke with patient and her 2 daughters. She is clinically stable. Would  continue current medical regimen and observation on telemetry, possibly home tomorrow to arrange outpatient Myoview with [facility] cardiology. Of note, her husband is currently hospitalized in the 3000 unit. Will speak with nursing about possibility of her making a brief visit on a portable telemetry monitor today.  Jonelle Sidle, M.D., F.A.C.C.

## 2011-08-19 ENCOUNTER — Other Ambulatory Visit: Payer: Self-pay

## 2011-08-19 LAB — CBC
MCH: 30.6 pg (ref 26.0–34.0)
Platelets: 239 10*3/uL (ref 150–400)
RBC: 4.51 MIL/uL (ref 3.87–5.11)
RDW: 12.8 % (ref 11.5–15.5)
WBC: 10.6 10*3/uL — ABNORMAL HIGH (ref 4.0–10.5)

## 2011-08-19 LAB — PROTIME-INR: Prothrombin Time: 24.3 seconds — ABNORMAL HIGH (ref 11.6–15.2)

## 2011-08-19 MED ORDER — ALPRAZOLAM 0.25 MG PO TABS: 0.2500 mg | ORAL_TABLET | Freq: Two times a day (BID) | ORAL | Status: AC | PRN

## 2011-08-19 MED ORDER — AMLODIPINE BESYLATE 5 MG PO TABS: 5.0000 mg | ORAL_TABLET | Freq: Every day | ORAL | Status: DC

## 2011-08-19 MED ORDER — SOTALOL HCL 80 MG PO TABS: 80.0000 mg | ORAL_TABLET | Freq: Two times a day (BID) | ORAL | Status: DC

## 2011-08-19 NOTE — Progress Notes (Signed)
CARDIAC REHAB PHASE I   PRE:  Rate/Rhythm: 59 SB  BP:  Supine:   Sitting: 124/70  Standing:    SaO2: 98% RA  MODE:  Ambulation: 200 ft   POST:  Rate/Rhythem: 61   BP:  Supine:   Sitting: 122/66  Standing:    SaO2: 98% RA 0955 - 1010 Pt ambulated steady. Tolerated fair, rest stop x 1 tired at end of walk. Back to chair with feet up, call bell in reach. Education done for diet and exercise.   Rosalie Doctor

## 2011-08-19 NOTE — Progress Notes (Signed)
Pt requesting information about new medication-Sotalol. Pt given printed care notes about medication. Encouraged the pt to ask questions after reading the notes. Pt was very appreciative. Will continue to monitor.

## 2011-08-19 NOTE — Progress Notes (Signed)
ANTICOAGULATION CONSULT NOTE - Follow Up Consult  Pharmacy Consult for coumadin  Indication: afib   Allergies  Allergen Reactions  . Codeine Swelling    Patient Measurements: Height: 5' 3.5" (161.3 cm) Weight: 201 lb 1 oz (91.2 kg) IBW/kg (Calculated) : 53.55   Vital Signs: Temp: 97.5 F (36.4 C) (01/07 0545) BP: 119/73 mmHg (01/07 1023) Pulse Rate: 56  (01/07 0545)  Labs:  Basename 08/19/11 0500 08/18/11 0500 08/17/11 0545 08/16/11 2336 08/16/11 1803 08/16/11 1445  HGB 13.8 13.8 -- -- -- --  HCT 40.7 41.4 -- -- -- 39.3  PLT 239 241 -- -- -- 214  APTT -- -- -- -- -- --  LABPROT 24.3* -- 24.2* -- -- 23.2*  INR 2.14* -- 2.13* -- -- 2.02*  HEPARINUNFRC -- -- -- -- -- --  CREATININE -- -- 0.70 -- -- 0.74  CKTOTAL -- -- -- 60 70 75  CKMB -- -- -- 3.5 4.4* 5.3*  TROPONINI -- -- -- 0.42* 0.52* 0.65*   Estimated Creatinine Clearance: 81 ml/min (by C-G formula based on Cr of 0.7).   Assessment: 61 yo female here with afib and NSTEMI on coumadin PTA (5mg  daily) to continue while admitted. INR stable and therapeutic, hgb and plt stable, no bleeding noted per chart  Goal of Therapy:  INR 2-3   Plan:  continue coumadin at 5mg /day    Riki Rusk 08/19/2011,12:01 PM

## 2011-08-19 NOTE — Discharge Summary (Cosign Needed)
Patient ID: Courtney Dunlap MRN: [id number] DOB/AGE: 61-Dec-1952 61 y.o.  Admit date: 08/15/2011 Discharge date: 08/19/2011  Primary Discharge Diagnosis  Atrial fibrillation with RVR  Secondary Discharge Diagnosis  Chest pain with NSTEMI due to demand ischemia from rapid heart rate  Obstructive Sleep Apnea on CPAP  Hyperthyroidism followed by Dr. Evlyn Kanner  Significant Diagnostic Studies: 2D echo  Consults: none  Hospital Course:  This is a 61yo WM with history of PAF in setting of hyperthyroidism in the past.  She had been doing well except for a increasing number of short lived episodes of palpitations over the past 6 months.  She was admitted to the hospital with sudden onset of palpitations after doing housework and developed chest pain and dizziness.  In ER she was in atrial fibrillation with RVR and ST depression in the lateral leads.  She was given IV Cardizem with conversion to NSR and resolution of chest pain.  She was noted to have mildly elevated cardiac enzymes and was admitted.  It was felt that her NSTEMI was due to demand ischemia from rapid heart rate.  Her Metoprolol was stopped and she was started on Betapace.  She continued on Warfarin.  An echo showed NL LVF.  She did well with Betapace load and was discharged home in stable condition.      Discharge Exam: Blood pressure 119/73, pulse 56, temperature 97.5 F (36.4 C), temperature source Oral, resp. rate 18, height 5' 3.5" (1.613 m), weight 91.2 kg (201 lb 1 oz), SpO2 95.00%.   WD, WN WF in NAD HEENT - benign NECK - supple no LAD LUNG - CTA bilaterally COR - RRR with no M/R/G EXT - no Cyanosis/edema or erythema   Lab Results  Component Value Date   WBC 10.6* 08/19/2011   HGB 13.8 08/19/2011   HCT 40.7 08/19/2011   MCV 90.2 08/19/2011   PLT 239 08/19/2011    Lab 08/17/11 0545 08/16/11 1445  NA 140 --  K 4.0 --  CL 105 --  CO2 26 --  BUN 11 --  CREATININE 0.70 --  CALCIUM 9.1 --  PROT -- 7.0  BILITOT -- 0.5  ALKPHOS --  114  ALT -- 30  AST -- 23  GLUCOSE 103* --   Lab Results  Component Value Date   CKTOTAL 60 08/16/2011   CKMB 3.5 08/16/2011   TROPONINI 0.42* 08/16/2011      Radiology: *RADIOLOGY REPORT*  Clinical Data: Shortness of breath, tachycardia.  PORTABLE CHEST - 1 VIEW  Comparison: 08/01/2011  Findings: Cardiomegaly with mild vascular congestion. No confluent  opacities, effusions or edema. No acute bony abnormality.  IMPRESSION:  Cardiomegaly, mild venous congestion.  Original Report Authenticated By: Cyndie Chime, M.D.  EKG:  NSR  FOLLOW UP PLANS AND APPOINTMENTS Discharge Orders    Future Orders Please Complete By Expires   Diet - low sodium heart healthy      Increase activity slowly      Call MD for:  persistant dizziness or light-headedness        Current Discharge Medication List    START taking these medications   Details  amLODipine (NORVASC) 5 MG tablet Take 1 tablet (5 mg total) by mouth daily. Qty: 30 tablet, Refills: 11    sotalol (BETAPACE) 80 MG tablet Take 1 tablet (80 mg total) by mouth every 12 (twelve) hours. Qty: 60 tablet, Refills: 11      CONTINUE these medications which have NOT CHANGED   Details  digoxin (LANOXIN) 0.25 MG tablet Take 250 mcg by mouth daily.      warfarin (COUMADIN) 5 MG tablet Take 5 mg by mouth daily.        STOP taking these medications     metoprolol (LOPRESSOR) 100 MG tablet        Follow-up Information    Follow up with FERGUSON,CYNTHIA A, NP on 09/02/2011. (9:45am)    Contact information:   [facility] [address] [name] [address] [phone]          BRING ALL MEDICATIONS WITH YOU TO FOLLOW UP APPOINTMENTS  Time spent with patient to include physician time:  35 minutes  Signed: Quintella Reichert 08/19/2011, 12:00 PM    The patient states she is under a lot of stress and anxiety at home due her her husband being ill in the hospital and issues at  home.  She would like something for her nerves.  I gave her a prescription for Xanax 0.25mg  BID as needed for anxiety #15 tablets with no refills.

## 2013-01-27 ENCOUNTER — Ambulatory Visit: Payer: Self-pay | Admitting: Obstetrics & Gynecology

## 2013-01-29 ENCOUNTER — Ambulatory Visit: Payer: Self-pay | Admitting: Obstetrics & Gynecology

## 2013-03-25 ENCOUNTER — Encounter: Payer: Self-pay | Admitting: Obstetrics & Gynecology

## 2013-03-26 ENCOUNTER — Ambulatory Visit: Payer: Self-pay | Admitting: Obstetrics & Gynecology

## 2013-06-09 ENCOUNTER — Ambulatory Visit: Payer: BC Managed Care – PPO | Admitting: Cardiology

## 2013-06-24 ENCOUNTER — Ambulatory Visit: Payer: Self-pay | Admitting: Obstetrics & Gynecology

## 2013-09-16 ENCOUNTER — Encounter: Payer: Self-pay | Admitting: Obstetrics & Gynecology

## 2013-09-16 ENCOUNTER — Ambulatory Visit (INDEPENDENT_AMBULATORY_CARE_PROVIDER_SITE_OTHER): Payer: BC Managed Care – PPO | Admitting: Obstetrics & Gynecology

## 2013-09-16 VITALS — BP 136/76 | HR 60 | Resp 12 | Ht 63.0 in | Wt 218.4 lb

## 2013-09-16 DIAGNOSIS — Z01419 Encounter for gynecological examination (general) (routine) without abnormal findings: Secondary | ICD-10-CM

## 2013-09-16 DIAGNOSIS — Z0000 Encounter for general adult medical examination without abnormal findings: Secondary | ICD-10-CM

## 2013-09-16 NOTE — Progress Notes (Signed)
63 y.o. [id number] MarriedCaucasianF here for annual exam.  Doing well.  Dr. Forde Dandy is PCP.  Goes every six months.  No vaginal bleeding.  Patient's last menstrual period was 11/21/2010.          Sexually active: no  The current method of family planning is none.    Exercising: yes  swimming Smoker:  Previous smoker 35 years ago  Health Maintenance: Pap:  01/02/12 WNL/negative HR HPV History of abnormal Pap:  Yes ASCUS 1998 MMG:  6/14 at [facility] per patient Colonoscopy:  Years ago-has been doing the stool cards BMD:   2013 normal TDaP:  2006 Screening Labs: PCP, Hb today: PCP, Urine today: WBC-1+   reports that she has quit smoking. Her smoking use included Cigarettes. She smoked 0.00 packs per day for 4 years. She has never used smokeless tobacco. She reports that she does not drink alcohol or use illicit drugs.  Past Medical History  Diagnosis Date  . A-fib   . Hyperthyroidism   . Angina   . Sleep apnea   . Cancer     skin  . Hypertension   . Hypercholesteremia   . GERD (gastroesophageal reflux disease)   . MI (myocardial infarction) 1/13    Past Surgical History  Procedure Laterality Date  . Tubal ligation    . Tonsillectomy    . Dilation and curettage of uterus  1996     Polyp resection   . Hysteroscopy  4/12    D&C  . Appendectomy  28 years ago    Current Outpatient Prescriptions  Medication Sig Dispense Refill  . acetaminophen (TYLENOL) 500 MG tablet Take 500 mg by mouth every 6 (six) hours as needed for pain.      [name] atorvastatin (LIPITOR) 20 MG tablet 20 mg daily.      . Cyanocobalamin (VITAMIN B 12 PO) Take by mouth daily.      . Omega-3 Fatty Acids (FISH OIL PO) Take by mouth daily.      . Vitamin D, Ergocalciferol, (DRISDOL) 50000 UNITS CAPS capsule once a week.      . warfarin (COUMADIN) 5 MG tablet Take 5 mg by mouth. Taking 1 1/2 tabs daily      . sotalol (BETAPACE) 80 MG tablet Take 1 tablet (80 mg total) by mouth every 12 (twelve) hours.  60 tablet  11    No current facility-administered medications for this visit.    Family History  Problem Relation Age of Onset  . Hypertension Mother   . Hypertension Sister   . Thyroid disease Sister   . Hypertension Sister   . Hypertension Brother   . Thyroid disease Brother   . Diabetes Maternal Grandmother   . Heart attack Maternal Grandmother     ROS:  Pertinent items are noted in HPI.  Otherwise, a comprehensive ROS was negative.  Exam:   BP 136/76  Pulse 60  Resp 12  Ht 5\' 3"  (1.6 m)  Wt 218 lb 6.4 oz (99.066 kg)  BMI 38.70 kg/m2  LMP 11/21/2010  Weight change: +23#  Height: 5\' 3"  (160 cm)  Ht Readings from Last 3 Encounters:  09/16/13 5\' 3"  (1.6 m)  08/18/11 5' 3.5" (1.613 m)    General appearance: alert, cooperative and appears stated age Head: Normocephalic, without obvious abnormality, atraumatic Neck: no adenopathy, supple, symmetrical, trachea midline and thyroid normal to inspection and palpation Lungs: clear to auscultation bilaterally Breasts: normal appearance, no masses or tenderness Heart: regular rate and rhythm Abdomen:  soft, non-tender; bowel sounds normal; no masses,  no organomegaly Extremities: extremities normal, atraumatic, no cyanosis or edema Skin: Skin color, texture, turgor normal. No rashes or lesions Lymph nodes: Cervical, supraclavicular, and axillary nodes normal. No abnormal inguinal nodes palpated Neurologic: Grossly normal   Pelvic: External genitalia:  no lesions              Urethra:  normal appearing urethra with no masses, tenderness or lesions              Bartholins and Skenes: normal                 Vagina: normal appearing vagina with normal color and discharge, no lesions, 2nd degree cystocele              Cervix: no lesions              Pap taken: no Bimanual Exam:  Uterus:  normal size, contour, position, consistency, mobility, non-tender              Adnexa: normal adnexa and no mass, fullness, tenderness                Rectovaginal: Confirms               Anus:  normal sphincter tone, no lesions  A:  Well Woman with normal exam PMP, no HRT Hyperthyroidism H/O afib, on chronic anticoagulation H/O MI--sees cardiology yearly (Dr. Radford Pax) GERD  P:   Mammogram yearly.  Release signed. pap smear with neg HR HPV 2013 Sees Dr. Forde Dandy every 6 months Will discuss Zostavax with Dr. Forde Dandy D/W pt pelvic pt for mild cystocele and mild SUI.  She will consider and let me know. return annually or prn  An After Visit Summary was printed and given to the patient.

## 2013-09-16 NOTE — Patient Instructions (Signed)

## 2013-12-28 ENCOUNTER — Other Ambulatory Visit: Payer: Self-pay | Admitting: Cardiology

## 2014-01-11 ENCOUNTER — Telehealth: Payer: Self-pay | Admitting: Obstetrics & Gynecology

## 2014-01-11 ENCOUNTER — Ambulatory Visit (INDEPENDENT_AMBULATORY_CARE_PROVIDER_SITE_OTHER): Payer: BC Managed Care – PPO | Admitting: Obstetrics & Gynecology

## 2014-01-11 ENCOUNTER — Encounter: Payer: Self-pay | Admitting: Obstetrics & Gynecology

## 2014-01-11 VITALS — BP 108/58 | HR 60 | Resp 16 | Ht 63.0 in | Wt 208.4 lb

## 2014-01-11 DIAGNOSIS — N950 Postmenopausal bleeding: Secondary | ICD-10-CM

## 2014-01-11 DIAGNOSIS — Z7901 Long term (current) use of anticoagulants: Secondary | ICD-10-CM

## 2014-01-11 NOTE — Telephone Encounter (Signed)
Spoke with patient at time of incoming call. Patient very worried that she woke up this morning and had blood on tissue when she wiped after voiding. She states that this issue happened a few years ago and it progressively got worse and was bleeding very heavily. She is concerned because she is also on coumadin. She requests to see Dr. Sabra Heck today and is willing to wait as she does not want another provider. Patient states bleeding is light at this time but that she is afraid it will progress again. Patient denies any further complaints.   Advised will call her back to give appointment.

## 2014-01-11 NOTE — Telephone Encounter (Signed)
Spoke with patient and requested she come at 12:00 for appointment with Dr. Sabra Heck. Advised Dr. Sabra Heck is in surgery and we are working her into schedule. Patient very thankful for appointment as she really prefers to see Dr. Sabra Heck for this issue and is agreeable to possible wait time.   Routing to provider for final review. Patient agreeable to disposition. Will close encounter

## 2014-01-11 NOTE — Telephone Encounter (Signed)
Pt says she saw blood on tissue when she went to bathroom this morning.

## 2014-01-11 NOTE — Progress Notes (Signed)
Subjective:     Patient ID: Courtney Dunlap, female   DOB: 1951-04-11, 63 y.o.   MRN: [id number]  HPI 63 yo G2P2 MWF here for new onset of PMP bleeding.  Pt reported getting up this morning and seeing [facility].  It is not heavy.  She does have some pelvic fullness.  No fevers.  No vaginal discharge.    H/O PMP bleeding three years ago.  Could not do biopsy in office at that time despite multiple attempts.  Pt taken to OR for procedure.  Uterus sounded to 7cm in OR.  No polyps were noted.  D&C performed with pathology showing Inactive endometrium.  Bleeding resolved.  She has not had any issues until this morning.  Pt with hx of MI and afib.  On chronic anticoagulation.  INR is usually between 2 - 2.5.  Has tested monthly.  Not due again for one week.  Review of Systems  All other systems reviewed and are negative.      Objective:   Physical Exam  Constitutional: She is oriented to person, place, and time. She appears well-developed and well-nourished.  Abdominal: Soft. Bowel sounds are normal.  Genitourinary: Uterus normal. There is no rash or tenderness on the right labia. There is no rash on the left labia. Uterus is not enlarged, not fixed and not tender. Right adnexum displays no mass and no tenderness. Left adnexum displays no mass and no tenderness. There is bleeding (at cervical os with mucous noted) around the vagina.  Lymphadenopathy:       Right: No inguinal adenopathy present.       Left: No inguinal adenopathy present.  Neurological: She is alert and oriented to person, place, and time.  Skin: Skin is warm and dry.  Psychiatric: She has a normal mood and affect.   Recommended either proceeding with PUS vs endometrial biopsy.  Pt very nervous about either and wants to do whatever she can right now and not wait.  Decided to proceed with biopsy as can be done right now while she is in the office.    Verbal and written consent obtained.   Procedure:  Speculum placed.  Cervix  visualized and cleansed with betadine prep.  A single toothed tenaculum was applied to the anterior lip of the cervix.  Endometrial pipelle was advanced through the cervix into the endometrial cavity without difficulty.  No dilation necessary.  Pipelle passed to 6.5cm.  Suction applied and pipelle removed with good tissue sample obtained.  Tenculum removed.  No bleeding noted.  Patient tolerated procedure well. .   Assessment:     PMP bleeding On anticoagulation     Plan:     Endometrial biopsy pending.  Results will be called to pt.

## 2014-01-11 NOTE — Patient Instructions (Signed)

## 2014-01-12 ENCOUNTER — Telehealth: Payer: Self-pay | Admitting: Emergency Medicine

## 2014-01-12 NOTE — Telephone Encounter (Signed)
Outgoing call to patient to follow up from Endometrial bx from yesterday. Patient states "I am not doing too bad." she states that bleeding has slightly increased but only wearing one pad per day. She states she is still feeling pain on the right side but this is intermittent pain that has been ongoing for her and she feels it may be related to muscular symptoms on right side. Patient denies fevers, denies vaginal discharge, denies abdominal pain. She is at work today. She will call back if bleeding worsens and will wait for our office to call with results.

## 2014-01-12 NOTE — Telephone Encounter (Signed)
Thank you. Encounter closed. 

## 2014-01-14 ENCOUNTER — Telehealth: Payer: Self-pay | Admitting: Obstetrics & Gynecology

## 2014-01-14 DIAGNOSIS — N950 Postmenopausal bleeding: Secondary | ICD-10-CM

## 2014-01-14 NOTE — Telephone Encounter (Signed)
Call back to patient, no call today from [place] who is out of office. Patient states she was told her results would be back yesterday or today. Notified that results are not yet available but that I will check with Dr Sabra Heck and that she or Olivia Mackie will call her back when results available.

## 2014-01-14 NOTE — Telephone Encounter (Signed)
Message copied by Jaymes Graff on Fri Jan 14, 2014  6:05 PM ------      Message from: Megan Salon      Created: Fri Jan 14, 2014  6:01 PM       Inform pathology is negative.  If still, I want to do PUS.  If not bleeding but bleeding again, I want to do a PUS then. ------

## 2014-01-14 NOTE — Telephone Encounter (Signed)
Returning a call to Tracy °

## 2014-01-14 NOTE — Telephone Encounter (Signed)
Patient notified of negative path report as directed by Dr Sabra Heck. She reports she is still bleeding, "like a period when she was 42." Advisd of Dr Ammie Ferrier recommendation for ultrasound. Patient agreeable and scheduled for 01-18-14 at 1pm with Dr Sabra Heck.   Routing to provider for final review. Patient agreeable to disposition. Will close encounter  CC: Sabrina for precert

## 2014-01-18 ENCOUNTER — Ambulatory Visit (INDEPENDENT_AMBULATORY_CARE_PROVIDER_SITE_OTHER): Payer: BC Managed Care – PPO | Admitting: Obstetrics & Gynecology

## 2014-01-18 ENCOUNTER — Ambulatory Visit (INDEPENDENT_AMBULATORY_CARE_PROVIDER_SITE_OTHER): Payer: BC Managed Care – PPO

## 2014-01-18 VITALS — BP 142/82 | Ht 63.0 in | Wt 210.4 lb

## 2014-01-18 DIAGNOSIS — N950 Postmenopausal bleeding: Secondary | ICD-10-CM

## 2014-01-18 NOTE — Progress Notes (Signed)
63 y.o.Marriedfemale here for a pelvic ultrasound due to PMP bleeding.  Endometrial biopsy obtained 01/11/14 was negative.  Specimen states: "scanty fragment of benign inactive endometrium admixed with abundant mucus (limited material).  D/w pt "limited material" finding.  Biopsy was felt to be adequate at time of procedure.  Bleeding has stopped.  Pt still having some cramping in RLQ and continues to have the dull achy pain/discomfort that has been present for months in the RLQ   Patient's last menstrual period was 11/21/2010.  Sexually active:  no  Contraception: PMP state  FINDINGS: UTERUS: 5.8 x 3.2  3.0cm with 1.0cm fibroid EMS: 2.55mm, symmetric ADNEXA:   Left ovary 1.5 x 0.8 x 0.8cm   Right ovary 1.9 x 0.9 x 0.8cm CUL DE SAC: no free fluid   Images reviewed with pt.  Endometrium thin and in agreement with pathology findings.  Bleeding has stopped.  Pt having persistent RLQ pain and has not had a colonoscopy.  I HIGHLY recommend that she consider proceeding with this.  States she will think about it but declines for now.  She KNOWS that is not what I think she should do.  Assessment: PMP bleeding, negative endometrial biopsy, RLQ pain Plan: Pt to call with any additional bleeding Highly recommend colonoscopy.  States she will consider.  ~15 minutes spent with patient >50% of time was in face to face discussion of above.

## 2014-01-21 ENCOUNTER — Encounter: Payer: Self-pay | Admitting: Obstetrics & Gynecology

## 2014-02-04 ENCOUNTER — Encounter: Payer: Self-pay | Admitting: General Surgery

## 2014-02-04 DIAGNOSIS — G4733 Obstructive sleep apnea (adult) (pediatric): Secondary | ICD-10-CM | POA: Insufficient documentation

## 2014-02-04 DIAGNOSIS — I4891 Unspecified atrial fibrillation: Secondary | ICD-10-CM

## 2014-02-04 DIAGNOSIS — I10 Essential (primary) hypertension: Secondary | ICD-10-CM | POA: Insufficient documentation

## 2014-02-04 DIAGNOSIS — I4819 Other persistent atrial fibrillation: Secondary | ICD-10-CM | POA: Insufficient documentation

## 2014-02-04 DIAGNOSIS — I119 Hypertensive heart disease without heart failure: Secondary | ICD-10-CM

## 2014-02-04 HISTORY — DX: Obstructive sleep apnea (adult) (pediatric): G47.33

## 2014-02-04 HISTORY — DX: Essential (primary) hypertension: I10

## 2014-03-05 ENCOUNTER — Other Ambulatory Visit: Payer: Self-pay | Admitting: Cardiology

## 2014-03-10 ENCOUNTER — Encounter: Payer: Self-pay | Admitting: Cardiology

## 2014-03-10 ENCOUNTER — Ambulatory Visit (INDEPENDENT_AMBULATORY_CARE_PROVIDER_SITE_OTHER): Payer: BC Managed Care – PPO | Admitting: Cardiology

## 2014-03-10 VITALS — BP 120/84 | HR 73 | Ht 63.5 in | Wt 211.0 lb

## 2014-03-10 DIAGNOSIS — I10 Essential (primary) hypertension: Secondary | ICD-10-CM

## 2014-03-10 DIAGNOSIS — I4891 Unspecified atrial fibrillation: Secondary | ICD-10-CM

## 2014-03-10 DIAGNOSIS — I480 Paroxysmal atrial fibrillation: Secondary | ICD-10-CM

## 2014-03-10 DIAGNOSIS — G4733 Obstructive sleep apnea (adult) (pediatric): Secondary | ICD-10-CM

## 2014-03-10 MED ORDER — SOTALOL HCL 80 MG PO TABS: 80.0000 mg | ORAL_TABLET | Freq: Every day | ORAL | Status: DC

## 2014-03-10 MED ORDER — SOTALOL HCL 80 MG PO TABS: 40.0000 mg | ORAL_TABLET | Freq: Two times a day (BID) | ORAL | Status: DC

## 2014-03-10 NOTE — Progress Notes (Addendum)
[place], [place], [place]  [address] Phone: [phone] Fax:  [phone]  Date:  03/10/2014   ID:  Frederika C Martinique, DOB 12-May-1951, MRN [id number]  PCP:  [name], MD  Cardiologist:  Fransico Him, MD     History of Present Illness: Nazifa C Martinique is a 63 y.o. female with a history of PAF in the setting of hyperthyroidism, HTN and OSA on CPAP who is doing well.   She presents today doing well.  She denies any anginal chest pain, SOB, DOE (except with going up stairs) dizziness, palpitations or syncope.  Occasionally she will have some LE edema.  She tolerates the CPAP and uses a nasal pillow mask which she tolerates well.  She says that recently her CPAP machine seems to be blowing out more air than before.  We have not made any changes to her device in some time.  She feels rested in the am and has no daytime sleepiness.  Wt Readings from Last 3 Encounters:  03/10/14 211 lb (95.709 kg)  01/18/14 210 lb 6.4 oz (95.437 kg)  01/11/14 208 lb 6.4 oz (94.53 kg)     Past Medical History  Diagnosis Date  . A-fib     PAF 08/19/11 admitted due to AFIB with RVR  . Hyperthyroidism   . Angina   . Sleep apnea     severe with AHI 37/hr now on CPAP at 13cm H2O  . Cancer     skin  . Hypertension   . Hypercholesteremia   . GERD (gastroesophageal reflux disease)   . MI (myocardial infarction) 1/13  . Obesity   . Hiatal hernia     with GERD  . Shingles   . Mild cognitive impairment with memory loss   . Allergic dermatitis   . Lipoma     of the brain    Current Outpatient Prescriptions  Medication Sig Dispense Refill  . acetaminophen (TYLENOL) 500 MG tablet Take 500 mg by mouth every 6 (six) hours as needed for pain.      [name] atorvastatin (LIPITOR) 20 MG tablet 20 mg daily.      . Cyanocobalamin (VITAMIN B 12 PO) Take by mouth daily.      . diclofenac sodium (VOLTAREN) 1 % GEL Apply topically as needed.      . Omega-3 Fatty Acids (FISH OIL PO) Take 1,000 mg by  mouth daily.       . sotalol (BETAPACE) 80 MG tablet Take 0.5 tablets (40 mg total) by mouth 2 (two) times daily.  90 tablet  3  . Vitamin D, Ergocalciferol, (DRISDOL) 50000 UNITS CAPS capsule once a week.      . warfarin (COUMADIN) 5 MG tablet Take 5 mg by mouth. Taking 1 1/2 tabs daily       No current facility-administered medications for this visit.    Allergies:    Allergies  Allergen Reactions  . Codeine Swelling    Facial/lips  . Latex     Social History:  The patient  reports that she has quit smoking. Her smoking use included Cigarettes. She smoked 0.00 packs per day for 4 years. She has never used smokeless tobacco. She reports that she does not drink alcohol or use illicit drugs.   Family History:  The patient's family history includes Diabetes in her maternal grandmother; Heart attack in her maternal grandmother; Hypertension in her brother, mother, sister, and sister; Thyroid disease in her brother and sister.   ROS:  Please see the history of present illness.      All other systems reviewed and negative.   PHYSICAL EXAM: VS:  BP 120/84  Pulse 73  Ht 5' 3.5" (1.613 m)  Wt 211 lb (95.709 kg)  BMI 36.79 kg/m2  LMP 11/21/2010 Well nourished, well developed, in no acute distress HEENT: normal Neck: no JVD Cardiac:  normal S1, S2; RRR; no murmur Lungs:  clear to auscultation bilaterally, no wheezing, rhonchi or rales Abd: soft, nontender, no hepatomegaly Ext: no edema Skin: warm and dry Neuro:  CNs 2-12 intact, no focal abnormalities noted  EKG:     NSR at 73bpm with no ST changes, RAE  ASSESSMENT AND PLAN:  1. PAF maintaining NSR - continue Betapace/warfarin 2. HTN well controlled 3.   OSA on CPAP - I will have her take her machine to her DME to make sure there is not an issue with her device and also get a d/l  Followup with me in 6 months  Signed, Fransico Him, MD 03/10/2014 3:34 PM

## 2014-03-10 NOTE — Patient Instructions (Signed)
Your physician recommends that you continue on your current medications as directed. Please refer to the Current Medication list given to you today.  Your physician wants you to follow-up in: 6 months with Dr Turner You will receive a reminder letter in the mail two months in advance. If you don't receive a letter, please call our office to schedule the follow-up appointment.  

## 2014-03-17 ENCOUNTER — Encounter: Payer: Self-pay | Admitting: Cardiology

## 2014-04-25 ENCOUNTER — Emergency Department (HOSPITAL_COMMUNITY)
Admission: EM | Admit: 2014-04-25 | Discharge: 2014-04-25 | Disposition: A | Discharge status: Home / Self Care | Payer: BC Managed Care – PPO | Attending: Emergency Medicine | Admitting: Emergency Medicine

## 2014-04-25 ENCOUNTER — Emergency Department (HOSPITAL_COMMUNITY): Payer: BC Managed Care – PPO

## 2014-04-25 ENCOUNTER — Encounter (HOSPITAL_COMMUNITY): Payer: Self-pay | Admitting: Emergency Medicine

## 2014-04-25 DIAGNOSIS — Z872 Personal history of diseases of the skin and subcutaneous tissue: Secondary | ICD-10-CM | POA: Diagnosis not present

## 2014-04-25 DIAGNOSIS — S8990XA Unspecified injury of unspecified lower leg, initial encounter: Secondary | ICD-10-CM | POA: Insufficient documentation

## 2014-04-25 DIAGNOSIS — Z79899 Other long term (current) drug therapy: Secondary | ICD-10-CM | POA: Diagnosis not present

## 2014-04-25 DIAGNOSIS — Z91040 Latex allergy status: Secondary | ICD-10-CM | POA: Diagnosis not present

## 2014-04-25 DIAGNOSIS — I10 Essential (primary) hypertension: Secondary | ICD-10-CM | POA: Diagnosis not present

## 2014-04-25 DIAGNOSIS — Z8619 Personal history of other infectious and parasitic diseases: Secondary | ICD-10-CM | POA: Insufficient documentation

## 2014-04-25 DIAGNOSIS — S0993XA Unspecified injury of face, initial encounter: Secondary | ICD-10-CM | POA: Diagnosis not present

## 2014-04-25 DIAGNOSIS — S99929A Unspecified injury of unspecified foot, initial encounter: Principal | ICD-10-CM

## 2014-04-25 DIAGNOSIS — Z7901 Long term (current) use of anticoagulants: Secondary | ICD-10-CM | POA: Diagnosis not present

## 2014-04-25 DIAGNOSIS — Y9389 Activity, other specified: Secondary | ICD-10-CM | POA: Insufficient documentation

## 2014-04-25 DIAGNOSIS — S0990XA Unspecified injury of head, initial encounter: Secondary | ICD-10-CM | POA: Insufficient documentation

## 2014-04-25 DIAGNOSIS — I252 Old myocardial infarction: Secondary | ICD-10-CM | POA: Diagnosis not present

## 2014-04-25 DIAGNOSIS — S199XXA Unspecified injury of neck, initial encounter: Secondary | ICD-10-CM | POA: Diagnosis not present

## 2014-04-25 DIAGNOSIS — Z87891 Personal history of nicotine dependence: Secondary | ICD-10-CM | POA: Diagnosis not present

## 2014-04-25 DIAGNOSIS — Z85828 Personal history of other malignant neoplasm of skin: Secondary | ICD-10-CM | POA: Diagnosis not present

## 2014-04-25 DIAGNOSIS — Z8639 Personal history of other endocrine, nutritional and metabolic disease: Secondary | ICD-10-CM | POA: Insufficient documentation

## 2014-04-25 DIAGNOSIS — Y92410 Unspecified street and highway as the place of occurrence of the external cause: Secondary | ICD-10-CM | POA: Diagnosis not present

## 2014-04-25 DIAGNOSIS — E669 Obesity, unspecified: Secondary | ICD-10-CM | POA: Diagnosis not present

## 2014-04-25 DIAGNOSIS — S99919A Unspecified injury of unspecified ankle, initial encounter: Principal | ICD-10-CM

## 2014-04-25 DIAGNOSIS — I4891 Unspecified atrial fibrillation: Secondary | ICD-10-CM | POA: Diagnosis not present

## 2014-04-25 DIAGNOSIS — Z8719 Personal history of other diseases of the digestive system: Secondary | ICD-10-CM | POA: Insufficient documentation

## 2014-04-25 DIAGNOSIS — Z862 Personal history of diseases of the blood and blood-forming organs and certain disorders involving the immune mechanism: Secondary | ICD-10-CM | POA: Diagnosis not present

## 2014-04-25 DIAGNOSIS — M25561 Pain in right knee: Secondary | ICD-10-CM

## 2014-04-25 MED ORDER — ACETAMINOPHEN 500 MG PO TABS
1000.0000 mg | ORAL_TABLET | Freq: Once | ORAL | Status: AC
  Administered 2014-04-25: 1000 mg via ORAL
  Filled 2014-04-25: qty 2

## 2014-04-25 NOTE — Discharge Instructions (Signed)

## 2014-04-25 NOTE — ED Notes (Signed)
Pt unable to esign for d/c paperwork. Pt agreeable with d/c information, reasons to return.
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
## 2014-06-13 ENCOUNTER — Encounter (HOSPITAL_COMMUNITY): Payer: Self-pay | Admitting: Emergency Medicine

## 2014-07-25 ENCOUNTER — Telehealth: Payer: Self-pay | Admitting: Cardiology

## 2014-07-25 NOTE — Telephone Encounter (Signed)
On Tuesday,12/10, pt st she had an episode with heart racing up to 146 and BP 181/104. She st her heart was beating really fast and really "heavy." She st she "really has not been normal since then." Pt st her BP is a little better now and her HR is down.  Now, BP is 151/77 and sustaining in 140-150/70s. HR now in the 70s. Pt st she "just doesn't feel right." Her head hurts, she is sluggish, and just "not her uppity self."  Spoke with [employer]. Agreed to add the patient on the schedule with flex tomorrow.  Called patient, arranged OV with Nell Range tomorrow at 10:00.

## 2014-07-25 NOTE — Telephone Encounter (Signed)
New message     On dec 10 HR was 146 and bp was 181-104.  Since then, it has been up and down.  Now 164/92 and HR 61.  Pt states she does not feel good.  Please advise

## 2014-07-25 NOTE — Telephone Encounter (Signed)
Follow Up    Pt is calling back returning call. Please call.

## 2014-07-25 NOTE — Telephone Encounter (Signed)
Left message to call back  

## 2014-07-26 ENCOUNTER — Ambulatory Visit (INDEPENDENT_AMBULATORY_CARE_PROVIDER_SITE_OTHER): Payer: BC Managed Care – PPO | Admitting: Physician Assistant

## 2014-07-26 ENCOUNTER — Encounter: Payer: Self-pay | Admitting: Physician Assistant

## 2014-07-26 VITALS — BP 150/90 | HR 63 | Ht 63.0 in | Wt 221.0 lb

## 2014-07-26 DIAGNOSIS — I480 Paroxysmal atrial fibrillation: Secondary | ICD-10-CM

## 2014-07-26 DIAGNOSIS — I10 Essential (primary) hypertension: Secondary | ICD-10-CM

## 2014-07-26 MED ORDER — HYDROCHLOROTHIAZIDE 25 MG PO TABS: 25.0000 mg | ORAL_TABLET | Freq: Every day | ORAL | Status: DC
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
## 2014-07-26 NOTE — Patient Instructions (Signed)
Your physician has recommended you make the following change in your medication:  1) START HCTZ 25mg  daily. An Rx has been sent to your pharmacy  Your physician recommends that you return for lab work in: 1 week (Bmet)  Your physician recommends that you schedule a follow-up appointment in: 4-6 weeks with Dr.Turner

## 2014-08-02 ENCOUNTER — Other Ambulatory Visit (INDEPENDENT_AMBULATORY_CARE_PROVIDER_SITE_OTHER): Payer: BC Managed Care – PPO | Admitting: *Deleted

## 2014-08-02 DIAGNOSIS — I10 Essential (primary) hypertension: Secondary | ICD-10-CM

## 2014-08-02 LAB — BASIC METABOLIC PANEL
BUN: 20 mg/dL (ref 6–23)
CHLORIDE: 98 meq/L (ref 96–112)
CO2: 30 mEq/L (ref 19–32)
Calcium: 9.1 mg/dL (ref 8.4–10.5)
Creatinine, Ser: 0.7 mg/dL (ref 0.4–1.2)
GFR: 88.23 mL/min (ref >60.00)
GLUCOSE: 86 mg/dL (ref 70–99)
POTASSIUM: 3.9 meq/L (ref 3.5–5.1)
Sodium: 135 mEq/L (ref 135–145)

## 2014-09-06 ENCOUNTER — Ambulatory Visit: Payer: BC Managed Care – PPO | Admitting: Cardiology

## 2014-09-30 ENCOUNTER — Encounter: Payer: Self-pay | Admitting: Obstetrics & Gynecology

## 2014-09-30 ENCOUNTER — Ambulatory Visit (INDEPENDENT_AMBULATORY_CARE_PROVIDER_SITE_OTHER): Payer: BC Managed Care – PPO | Admitting: Obstetrics & Gynecology

## 2014-09-30 VITALS — BP 136/78 | HR 64 | Resp 16 | Ht 62.5 in | Wt 220.6 lb

## 2014-09-30 DIAGNOSIS — Z124 Encounter for screening for malignant neoplasm of cervix: Secondary | ICD-10-CM

## 2014-09-30 DIAGNOSIS — Z01419 Encounter for gynecological examination (general) (routine) without abnormal findings: Secondary | ICD-10-CM

## 2014-09-30 DIAGNOSIS — Z1211 Encounter for screening for malignant neoplasm of colon: Secondary | ICD-10-CM

## 2014-09-30 DIAGNOSIS — Z0000 Encounter for general adult medical examination without abnormal findings: Secondary | ICD-10-CM

## 2014-09-30 LAB — POCT URINALYSIS DIPSTICK
Bilirubin, UA: NEGATIVE
Blood, UA: NEGATIVE
Glucose, UA: NEGATIVE
Ketones, UA: NEGATIVE
Leukocytes, UA: NEGATIVE
Nitrite, UA: NEGATIVE
PH UA: 6.5
PROTEIN UA: NEGATIVE
Urobilinogen, UA: NEGATIVE

## 2014-09-30 NOTE — Progress Notes (Signed)
64 y.o. [id number] MarriedCaucasianF here for annual exam.  Doing well.  Was in [place] in September.  Was out of work for 8 weeks.  Car was totally.  Pt reports she was really lucky.    No vaginal bleeding.    Dr. Forde Dandy:  PCP.  Has appt on Monday.  Having Vit D checked.  Patient's last menstrual period was 11/21/2010.          Sexually active: No.  The current method of family planning is post menopausal status.    Exercising: Yes.    water exercises Smoker:  Former smoker 35-40 years ago  Health Maintenance: Pap:  01/02/12 WNL/negative HR HPV History of abnormal Pap:  no MMG:  6/15-MMG, 02/16/14 3D-right diag-normal Colonoscopy:  Years ago-doing stool cards BMD:   2013-normal TDaP:  Dr Addison Bailey appt next week Screening Labs: PCP, Hb today: PCP, Urine today: PH-6.5   reports that she has quit smoking. Her smoking use included Cigarettes. She quit after 4 years of use. She has never used smokeless tobacco. She reports that she drinks about 1.8 oz of alcohol per week. She reports that she does not use illicit drugs.  Past Medical History  Diagnosis Date  . A-fib     PAF 08/19/11 admitted due to AFIB with RVR  . Hyperthyroidism   . Angina   . Sleep apnea     severe with AHI 37/hr now on CPAP at 13cm H2O  . Cancer     skin  . Hypertension   . Hypercholesteremia   . GERD (gastroesophageal reflux disease)   . MI (myocardial infarction) 1/13  . Obesity   . Hiatal hernia     with GERD  . Shingles   . Mild cognitive impairment with memory loss   . Allergic dermatitis   . Lipoma     of the brain    Past Surgical History  Procedure Laterality Date  . Tubal ligation    . Tonsillectomy    . Dilation and curettage of uterus  1996     Polyp resection   . Hysteroscopy  4/12    D&C  . Appendectomy  28 years ago    Current Outpatient Prescriptions  Medication Sig Dispense Refill  . acetaminophen (TYLENOL) 500 MG tablet Take 500 mg by mouth every 6 (six) hours as needed for pain.    [name]  atorvastatin (LIPITOR) 20 MG tablet 20 mg daily.    . Cyanocobalamin (VITAMIN B 12 PO) Take by mouth daily.    . diclofenac sodium (VOLTAREN) 1 % GEL Apply topically as needed.    . hydrochlorothiazide (HYDRODIURIL) 25 MG tablet Take 1 tablet (25 mg total) by mouth daily. 30 tablet 5  . Omega-3 Fatty Acids (FISH OIL PO) Take 1,000 mg by mouth daily.     . sotalol (BETAPACE) 80 MG tablet Take 0.5 tablets (40 mg total) by mouth 2 (two) times daily. 90 tablet 3  . Vitamin D, Ergocalciferol, (DRISDOL) 50000 UNITS CAPS capsule Twice weekly    . warfarin (COUMADIN) 5 MG tablet Take 5 mg by mouth. Taking 1 1/2 tabs daily     No current facility-administered medications for this visit.    Family History  Problem Relation Age of Onset  . Hypertension Mother   . Hypertension Sister   . Thyroid disease Sister   . Hypertension Sister   . Hypertension Brother   . Thyroid disease Brother   . Diabetes Maternal Grandmother   . Heart attack Maternal Grandmother  ROS:  Pertinent items are noted in HPI.  Otherwise, a comprehensive ROS was negative.  Exam:   General appearance: alert, cooperative and appears stated age Head: Normocephalic, without obvious abnormality, atraumatic Neck: no adenopathy, supple, symmetrical, trachea midline and thyroid normal to inspection and palpation Lungs: clear to auscultation bilaterally Breasts: normal appearance, no masses or tenderness Heart: regular rate and rhythm Abdomen: soft, non-tender; bowel sounds normal; no masses,  no organomegaly Extremities: extremities normal, atraumatic, no cyanosis or edema Skin: Skin color, texture, turgor normal. No rashes or lesions Lymph nodes: Cervical, supraclavicular, and axillary nodes normal. No abnormal inguinal nodes palpated Neurologic: Grossly normal   Pelvic: External genitalia:  no lesions              Urethra:  normal appearing urethra with no masses, tenderness or lesions              Bartholins and  Skenes: normal                 Vagina: normal appearing vagina with normal color and discharge, no lesions              Cervix: no lesions              Pap taken: Yes.   Bimanual Exam:  Uterus:  normal size, contour, position, consistency, mobility, non-tender              Adnexa: normal adnexa and no mass, fullness, tenderness               Rectovaginal: Confirms               Anus:  normal sphincter tone, no lesions  Chaperone was present for exam.  A:  Well Woman with normal exam PMP, no HRT Hyperthyroidism H/O afib, on chronic anticoagulation H/O MI--sees cardiology yearly (Dr. Radford Pax) GERD  P: Mammogram yearly. pap smear with neg HR HPV 2013.  Pap today. Sees Dr. Forde Dandy every 6 months return annually or prn

## 2014-10-03 LAB — IPS PAP TEST WITH REFLEX TO HPV

## 2014-10-18 LAB — FECAL OCCULT BLOOD, IMMUNOCHEMICAL: IFOBT: NEGATIVE

## 2014-10-24 NOTE — Progress Notes (Signed)
Cardiology Office Note   Date:  10/25/2014   ID:  Courtney Dunlap, DOB 11-04-1950, MRN [id number]  PCP:  [name], MD  Cardiologist:   Sueanne Margarita, MD   Chief Complaint  Patient presents with  . Atrial Fibrillation  . Hypertension  . Sleep Apnea      History of Present Illness: Courtney Dunlap is a 64 y.o. female with a history of PAF in the setting of hyperthyroidism, HTN and OSA on CPAP who is doing well. She presents today doing well. She denies any anginal chest pain, SOB, DOE (except with going up stairs) dizziness or syncope. She continues to have sharp pains in her left breast on occasion.  The other night she went to eat out and shortly thereafter had some palpitations that lasted about 2.5 hours and her HR was in the 160's.  SHe has not had any further palpitations.  Occasionally she will have some LE edema. She tolerates the CPAP and uses a nasal pillow mask which she tolerates well.  She feels rested in the am and has no daytime sleepiness.    Past Medical History  Diagnosis Date  . A-fib     PAF 08/19/11 admitted due to AFIB with RVR  . Hyperthyroidism   . Angina   . Sleep apnea     severe with AHI 37/hr now on CPAP at 13cm H2O  . Cancer     skin  . Hypertension   . Hypercholesteremia   . GERD (gastroesophageal reflux disease)   . MI (myocardial infarction) 1/13  . Obesity   . Hiatal hernia     with GERD  . Shingles   . Mild cognitive impairment with memory loss   . Allergic dermatitis   . Lipoma     of the brain    Past Surgical History  Procedure Laterality Date  . Tubal ligation    . Tonsillectomy    . Dilation and curettage of uterus  1996     Polyp resection   . Hysteroscopy  4/12    D&C  . Appendectomy  28 years ago     Current Outpatient Prescriptions  Medication Sig Dispense Refill  . acetaminophen (TYLENOL) 500 MG tablet Take 500 mg by mouth every 6 (six) hours as needed for pain.    [name] atorvastatin (LIPITOR) 20 MG  tablet 20 mg daily.    . Cyanocobalamin (VITAMIN B 12 PO) Take by mouth daily.    . diclofenac sodium (VOLTAREN) 1 % GEL Apply topically as needed.    . hydrochlorothiazide (HYDRODIURIL) 25 MG tablet Take 1 tablet (25 mg total) by mouth daily. 30 tablet 5  . Omega-3 Fatty Acids (FISH OIL PO) Take 1,000 mg by mouth daily.     . sotalol (BETAPACE) 80 MG tablet Take 0.5 tablets (40 mg total) by mouth 2 (two) times daily. 90 tablet 3  . Vitamin D, Ergocalciferol, (DRISDOL) 50000 UNITS CAPS capsule Twice weekly    . warfarin (COUMADIN) 5 MG tablet Take 5 mg by mouth daily. Taking 1 1/2 tabs daily except Friday, take 2 tabs.     No current facility-administered medications for this visit.    Allergies:   Codeine and Latex    Social History:  The patient  reports that she has quit smoking. Her smoking use included Cigarettes. She quit after 4 years of use. She has never used smokeless tobacco. She reports that she drinks about 1.8 oz of alcohol per week. She  reports that she does not use illicit drugs.   Family History:  The patient's family history includes Diabetes in her maternal grandmother; Heart attack in her maternal grandmother; Hypertension in her brother, mother, sister, and sister; Thyroid disease in her brother and sister.    ROS:  Please see the history of present illness.   Otherwise, review of systems are positive for none.   All other systems are reviewed and negative.    PHYSICAL EXAM: VS:  BP 110/62 mmHg  Pulse 68  Ht 5' 2.5" (1.588 m)  Wt 214 lb 12.8 oz (97.433 kg)  BMI 38.64 kg/m2  SpO2 94%  LMP 11/21/2010 , BMI Body mass index is 38.64 kg/(m^2). GEN: Well nourished, well developed, in no acute distress HEENT: normal Neck: no JVD, carotid bruits, or masses Cardiac: RRR; no murmurs, rubs, or gallops,no edema  Respiratory:  clear to auscultation bilaterally, normal work of breathing GI: soft, nontender, nondistended, + BS MS: no deformity or atrophy Skin: warm and  dry, no rash Neuro:  Strength and sensation are intact Psych: euthymic mood, full affect   EKG:  EKG is not ordered today.    Recent Labs: 08/02/2014: BUN 20; Creatinine 0.7; Potassium 3.9; Sodium 135    Lipid Panel No results found for: CHOL, TRIG, HDL, CHOLHDL, VLDL, LDLCALC, LDLDIRECT    Wt Readings from Last 3 Encounters:  10/25/14 214 lb 12.8 oz (97.433 kg)  09/30/14 220 lb 9.6 oz (100.064 kg)  07/26/14 221 lb (100.245 kg)        ASSESSMENT AND PLAN:  1. PAF maintaining NSR except for one episode of palpitations after eating out but otherwise no breakthrough - continue Betapace/warfarin 2. HTN well controlled.  Continue HCTZ   3.     OSA on CPAP - I will get a d/l from her DME  Current medicines are reviewed at length with the patient today.  The patient does not have concerns regarding medicines.  The following changes have been made:  no change  Labs/ tests ordered today include: None  No orders of the defined types were placed in this encounter.     Disposition:   FU with me in 6 months   Signed, Sueanne Margarita, MD  10/25/2014 10:57 AM    [facility] [address], [place], [place]  [address] Phone: [phone]; Fax: [phone]

## 2014-10-25 ENCOUNTER — Ambulatory Visit (INDEPENDENT_AMBULATORY_CARE_PROVIDER_SITE_OTHER): Payer: BC Managed Care – PPO | Admitting: Cardiology

## 2014-10-25 ENCOUNTER — Encounter: Payer: Self-pay | Admitting: Cardiology

## 2014-10-25 VITALS — BP 110/62 | HR 68 | Ht 62.5 in | Wt 214.8 lb

## 2014-10-25 DIAGNOSIS — I480 Paroxysmal atrial fibrillation: Secondary | ICD-10-CM

## 2014-10-25 DIAGNOSIS — G4733 Obstructive sleep apnea (adult) (pediatric): Secondary | ICD-10-CM

## 2014-10-25 DIAGNOSIS — I10 Essential (primary) hypertension: Secondary | ICD-10-CM

## 2014-10-25 NOTE — Patient Instructions (Signed)
Your physician wants you to follow-up in: 6 months with Dr. Radford Pax. You will receive a reminder letter in the mail two months in advance. If you don't receive a letter, please call our office to schedule the follow-up appointment.

## 2014-11-01 ENCOUNTER — Other Ambulatory Visit: Payer: Self-pay | Admitting: *Deleted

## 2014-11-01 DIAGNOSIS — I10 Essential (primary) hypertension: Secondary | ICD-10-CM

## 2014-11-01 MED ORDER — HYDROCHLOROTHIAZIDE 25 MG PO TABS
25.0000 mg | ORAL_TABLET | Freq: Every day | ORAL | Status: DC
Start: 2014-11-01 — End: 2015-04-27

## 2014-12-03 ENCOUNTER — Emergency Department (HOSPITAL_COMMUNITY)
Admission: EM | Admit: 2014-12-03 | Discharge: 2014-12-03 | Disposition: A | Discharge status: Home / Self Care | Payer: BC Managed Care – PPO | Attending: Emergency Medicine | Admitting: Emergency Medicine

## 2014-12-03 ENCOUNTER — Encounter (HOSPITAL_COMMUNITY): Payer: Self-pay | Admitting: Emergency Medicine

## 2014-12-03 ENCOUNTER — Emergency Department (HOSPITAL_COMMUNITY): Payer: BC Managed Care – PPO

## 2014-12-03 DIAGNOSIS — G4733 Obstructive sleep apnea (adult) (pediatric): Secondary | ICD-10-CM | POA: Insufficient documentation

## 2014-12-03 DIAGNOSIS — Z79899 Other long term (current) drug therapy: Secondary | ICD-10-CM | POA: Diagnosis not present

## 2014-12-03 DIAGNOSIS — I4891 Unspecified atrial fibrillation: Secondary | ICD-10-CM | POA: Diagnosis not present

## 2014-12-03 DIAGNOSIS — Z8619 Personal history of other infectious and parasitic diseases: Secondary | ICD-10-CM | POA: Diagnosis not present

## 2014-12-03 DIAGNOSIS — Z0000 Encounter for general adult medical examination without abnormal findings: Secondary | ICD-10-CM | POA: Diagnosis not present

## 2014-12-03 DIAGNOSIS — Z008 Encounter for other general examination: Secondary | ICD-10-CM

## 2014-12-03 DIAGNOSIS — Z85828 Personal history of other malignant neoplasm of skin: Secondary | ICD-10-CM | POA: Insufficient documentation

## 2014-12-03 DIAGNOSIS — Z87891 Personal history of nicotine dependence: Secondary | ICD-10-CM | POA: Diagnosis not present

## 2014-12-03 DIAGNOSIS — I252 Old myocardial infarction: Secondary | ICD-10-CM | POA: Diagnosis not present

## 2014-12-03 DIAGNOSIS — R55 Syncope and collapse: Secondary | ICD-10-CM

## 2014-12-03 DIAGNOSIS — I209 Angina pectoris, unspecified: Secondary | ICD-10-CM | POA: Diagnosis not present

## 2014-12-03 DIAGNOSIS — E669 Obesity, unspecified: Secondary | ICD-10-CM | POA: Diagnosis not present

## 2014-12-03 DIAGNOSIS — Z8719 Personal history of other diseases of the digestive system: Secondary | ICD-10-CM | POA: Diagnosis not present

## 2014-12-03 DIAGNOSIS — Z7901 Long term (current) use of anticoagulants: Secondary | ICD-10-CM | POA: Diagnosis not present

## 2014-12-03 DIAGNOSIS — Z9981 Dependence on supplemental oxygen: Secondary | ICD-10-CM | POA: Diagnosis not present

## 2014-12-03 DIAGNOSIS — E780 Pure hypercholesterolemia: Secondary | ICD-10-CM | POA: Diagnosis not present

## 2014-12-03 DIAGNOSIS — Z872 Personal history of diseases of the skin and subcutaneous tissue: Secondary | ICD-10-CM | POA: Diagnosis not present

## 2014-12-03 DIAGNOSIS — I10 Essential (primary) hypertension: Secondary | ICD-10-CM | POA: Diagnosis not present

## 2014-12-03 DIAGNOSIS — Z91040 Latex allergy status: Secondary | ICD-10-CM | POA: Diagnosis not present

## 2014-12-03 LAB — BASIC METABOLIC PANEL
Anion gap: 9 (ref 5–15)
BUN: 14 mg/dL (ref 6–23)
CALCIUM: 9.3 mg/dL (ref 8.4–10.5)
CHLORIDE: 102 mmol/L (ref 96–112)
CO2: 27 mmol/L (ref 19–32)
Creatinine, Ser: 0.8 mg/dL (ref 0.50–1.10)
GFR calc Af Amer: 89 mL/min — ABNORMAL LOW (ref >90)
GFR calc non Af Amer: 77 mL/min — ABNORMAL LOW (ref >90)
GLUCOSE: 96 mg/dL (ref 70–99)
POTASSIUM: 3.6 mmol/L (ref 3.5–5.1)
Sodium: 138 mmol/L (ref 135–145)

## 2014-12-03 LAB — CBC WITH DIFFERENTIAL/PLATELET
Basophils Absolute: 0 10*3/uL (ref 0.0–0.1)
Basophils Relative: 0 % (ref 0–1)
EOS PCT: 2 % (ref 0–5)
Eosinophils Absolute: 0.2 10*3/uL (ref 0.0–0.7)
HEMATOCRIT: 43.4 % (ref 36.0–46.0)
HEMOGLOBIN: 14.7 g/dL (ref 12.0–15.0)
LYMPHS ABS: 1.8 10*3/uL (ref 0.7–4.0)
Lymphocytes Relative: 20 % (ref 12–46)
MCH: 30.8 pg (ref 26.0–34.0)
MCHC: 33.9 g/dL (ref 30.0–36.0)
MCV: 91 fL (ref 78.0–100.0)
Monocytes Absolute: 1 10*3/uL (ref 0.1–1.0)
Monocytes Relative: 11 % (ref 3–12)
NEUTROS ABS: 6 10*3/uL (ref 1.7–7.7)
NEUTROS PCT: 67 % (ref 43–77)
Platelets: 214 10*3/uL (ref 150–400)
RBC: 4.77 MIL/uL (ref 3.87–5.11)
RDW: 12.7 % (ref 11.5–15.5)
WBC: 9 10*3/uL (ref 4.0–10.5)

## 2014-12-03 LAB — I-STAT TROPONIN, ED: Troponin i, poc: 0 ng/mL (ref 0.00–0.08)

## 2014-12-03 LAB — PROTIME-INR
INR: 2.82 — AB (ref 0.00–1.49)
Prothrombin Time: 29.9 seconds — ABNORMAL HIGH (ref 11.6–15.2)

## 2014-12-03 NOTE — ED Notes (Signed)
Pt from home via [facility] with c/o being lightheaded, palpations, flushed feeling, pounding in head, a warm feeling that comes from her abdomen up when the palpations occur, and near syncope while working in her garden.  Pt denies SOB, chest pain, N/V.  EKG unremarkable.  Hx MI.  Pt in NAD, A&O.

## 2014-12-03 NOTE — ED Notes (Signed)
Called x-ray to report patient is ready for transport. Explained xray order to patient as well.

## 2014-12-03 NOTE — Discharge Instructions (Signed)
Near-Syncope Near-syncope (commonly known as near fainting) is sudden weakness, dizziness, or feeling like you might pass out. During an episode of near-syncope, you may also develop pale skin, have tunnel vision, or feel sick to your stomach (nauseous). Near-syncope may occur when getting up after sitting or while standing for a long time. It is caused by a sudden decrease in blood flow to the brain. This decrease can result from various causes or triggers, most of which are not serious. However, because near-syncope can sometimes be a sign of something serious, a medical evaluation is required. The specific cause is often not determined. HOME CARE INSTRUCTIONS  Monitor your condition for any changes. The following actions may help to alleviate any discomfort you are experiencing:  Have someone stay with you until you feel stable.  Lie down right away and prop your feet up if you start feeling like you might faint. Breathe deeply and steadily. Wait until all the symptoms have passed. Most of these episodes last only a few minutes. You may feel tired for several hours.   Drink enough fluids to keep your urine clear or pale yellow.   If you are taking blood pressure or heart medicine, get up slowly when seated or lying down. Take several minutes to sit and then stand. This can reduce dizziness.  Follow up with your health care provider as directed. SEEK IMMEDIATE MEDICAL CARE IF:   You have a severe headache.   You have unusual pain in the chest, abdomen, or back.   You are bleeding from the mouth or rectum, or you have black or tarry stool.   You have an irregular or very fast heartbeat.   You have repeated fainting or have seizure-like jerking during an episode.   You faint when sitting or lying down.   You have confusion.   You have difficulty walking.   You have severe weakness.   You have vision problems.  MAKE SURE YOU:   Understand these instructions.  Will  watch your condition.  Will get help right away if you are not doing well or get worse. Document Released: 07/29/2005 Document Revised: 08/03/2013 Document Reviewed: 01/01/2013 Exit[employer] Patient Information 2015 [employer]. This information is not intended to replace advice given to you by your health care provider. Make sure you discuss any questions you have with your health care provider.  

## 2014-12-03 NOTE — ED Notes (Signed)
Ambulated with patient in the hallway to restroom, approximately 175ft. No complaints of dizziness or pressure in head. Steady gait. O2 sat reading 975-100% on room air, pulse in the 70s.

## 2014-12-03 NOTE — ED Provider Notes (Signed)
CSN: [id number]     Arrival date & time 12/03/14  1816 History   First MD Initiated Contact with Patient 12/03/14 1817     Chief Complaint  Patient presents with  . Near Syncope     (Consider location/radiation/quality/duration/timing/severity/associated sxs/prior Treatment) HPI Comments: Patient with past medical history of A. fib, hypertension, hyperlipidemia, prior MI, presents emergency department with chief complaint of heart palpitations and near syncopal episode. Patient states that she was out working in the yard all day, and when she went into relax she noticed a "flushing sensation" that ran throughout her body. She states that she felt fatigued, lightheaded, and may have had some heart palpitations. At this point, she called EMS. She is now complaining of a headache, but denies any chest pain, shortness of breath, nausea, vomiting, diarrhea, or constipation. She states that she does still feel "flushed." There are no aggravating or alleviating factors.  The history is provided by the patient. No language interpreter was used.    Past Medical History  Diagnosis Date  . A-fib     PAF 08/19/11 admitted due to AFIB with RVR  . Hyperthyroidism   . Angina   . Sleep apnea     severe with AHI 37/hr now on CPAP at 13cm H2O  . Cancer     skin  . Hypertension   . Hypercholesteremia   . GERD (gastroesophageal reflux disease)   . MI (myocardial infarction) 1/13  . Obesity   . Hiatal hernia     with GERD  . Shingles   . Mild cognitive impairment with memory loss   . Allergic dermatitis   . Lipoma     of the brain   Past Surgical History  Procedure Laterality Date  . Tubal ligation    . Tonsillectomy    . Dilation and curettage of uterus  1996     Polyp resection   . Hysteroscopy  4/12    D&C  . Appendectomy  28 years ago   Family History  Problem Relation Age of Onset  . Hypertension Mother   . Hypertension Sister   . Thyroid disease Sister   . Hypertension Sister   .  Hypertension Brother   . Thyroid disease Brother   . Diabetes Maternal Grandmother   . Heart attack Maternal Grandmother    History  Substance Use Topics  . Smoking status: Former Smoker -- 4 years    Types: Cigarettes  . Smokeless tobacco: Never Used     Comment: quit 35-40 years ago  . Alcohol Use: No   OB History    Gravida Para Term Preterm AB TAB SAB Ectopic Multiple Living   2 2 2       2      Review of Systems  Constitutional: Negative for fever and chills.  Respiratory: Negative for shortness of breath.   Cardiovascular: Negative for chest pain.  Gastrointestinal: Negative for nausea, vomiting, diarrhea and constipation.  Genitourinary: Negative for dysuria.  Neurological: Positive for syncope.  All other systems reviewed and are negative.     Allergies  Codeine and Latex  Home Medications   Prior to Admission medications   Medication Sig Start Date End Date Taking? Authorizing Provider  acetaminophen (TYLENOL) 500 MG tablet Take 500 mg by mouth every 6 (six) hours as needed for pain.    Historical Provider, MD  atorvastatin (LIPITOR) 20 MG tablet 20 mg daily. 07/27/13   Historical Provider, MD  Cyanocobalamin (VITAMIN B 12 PO) Take by  mouth daily.    Historical Provider, MD  diclofenac sodium (VOLTAREN) 1 % GEL Apply topically as needed.    Historical Provider, MD  hydrochlorothiazide (HYDRODIURIL) 25 MG tablet Take 1 tablet (25 mg total) by mouth daily. 11/01/14   Sueanne Margarita, MD  Omega-3 Fatty Acids (FISH OIL PO) Take 1,000 mg by mouth daily.     Historical Provider, MD  sotalol (BETAPACE) 80 MG tablet Take 0.5 tablets (40 mg total) by mouth 2 (two) times daily. 03/10/14   Sueanne Margarita, MD  Vitamin D, Ergocalciferol, (DRISDOL) 50000 UNITS CAPS capsule Twice weekly 07/27/13   Historical Provider, MD  warfarin (COUMADIN) 5 MG tablet Take 5 mg by mouth daily. Taking 1 1/2 tabs daily except Friday, take 2 tabs.    Historical Provider, MD   BP 163/82 mmHg   Temp(Src) 98.3 F (36.8 C) (Oral)  Resp 17  SpO2 98%  LMP 11/21/2010 Physical Exam  Constitutional: She is oriented to person, place, and time. She appears well-developed and well-nourished.  HENT:  Head: Normocephalic and atraumatic.  Eyes: Conjunctivae and EOM are normal. Pupils are equal, round, and reactive to light.  Neck: Normal range of motion. Neck supple.  Cardiovascular: Normal rate and regular rhythm.  Exam reveals no gallop and no friction rub.   No murmur heard. Pulmonary/Chest: Effort normal and breath sounds normal. No respiratory distress. She has no wheezes. She has no rales. She exhibits no tenderness.  Abdominal: Soft. Bowel sounds are normal. She exhibits no distension and no mass. There is no tenderness. There is no rebound and no guarding.  Musculoskeletal: Normal range of motion. She exhibits no edema or tenderness.  Neurological: She is alert and oriented to person, place, and time.  Skin: Skin is warm and dry.  Psychiatric: She has a normal mood and affect. Her behavior is normal. Judgment and thought content normal.  Nursing note and vitals reviewed.   ED Course  Procedures (including critical care time)  Results for orders placed or performed during the hospital encounter of 12/03/14  CBC with Differential/Platelet  Result Value Ref Range   WBC 9.0 4.0 - 10.5 K/uL   RBC 4.77 3.87 - 5.11 MIL/uL   Hemoglobin 14.7 12.0 - 15.0 g/dL   HCT 43.4 36.0 - 46.0 %   MCV 91.0 78.0 - 100.0 fL   MCH 30.8 26.0 - 34.0 pg   MCHC 33.9 30.0 - 36.0 g/dL   RDW 12.7 11.5 - 15.5 %   Platelets 214 150 - 400 K/uL   Neutrophils Relative % 67 43 - 77 %   Neutro Abs 6.0 1.7 - 7.7 K/uL   Lymphocytes Relative 20 12 - 46 %   Lymphs Abs 1.8 0.7 - 4.0 K/uL   Monocytes Relative 11 3 - 12 %   Monocytes Absolute 1.0 0.1 - 1.0 K/uL   Eosinophils Relative 2 0 - 5 %   Eosinophils Absolute 0.2 0.0 - 0.7 K/uL   Basophils Relative 0 0 - 1 %   Basophils Absolute 0.0 0.0 - 0.1 K/uL   Basic metabolic panel  Result Value Ref Range   Sodium 138 135 - 145 mmol/L   Potassium 3.6 3.5 - 5.1 mmol/L   Chloride 102 96 - 112 mmol/L   CO2 27 19 - 32 mmol/L   Glucose, Bld 96 70 - 99 mg/dL   BUN 14 6 - 23 mg/dL   Creatinine, Ser 0.80 0.50 - 1.10 mg/dL   Calcium 9.3 8.4 - 10.5 mg/dL  GFR calc non Af Amer 77 (L) >90 mL/min   GFR calc Af Amer 89 (L) >90 mL/min   Anion gap 9 5 - 15  Protime-INR  Result Value Ref Range   Prothrombin Time 29.9 (H) 11.6 - 15.2 seconds   INR 2.82 (H) 0.00 - 1.49  I-stat troponin, ED  Result Value Ref Range   Troponin i, poc 0.00 0.00 - 0.08 ng/mL   Comment 3           Dg Chest 2 View  12/03/2014   CLINICAL DATA:  Tachycardia, chest discomfort. History of high blood pressure.  EXAM: CHEST  2 VIEW  COMPARISON:  08/15/2011  FINDINGS: The heart size is at upper limits of normal. Both lungs are clear. The visualized skeletal structures are unremarkable.  IMPRESSION: No active cardiopulmonary disease.   Electronically Signed   By: Conchita Paris M.D.   On: 12/03/2014 20:09       EKG Interpretation   Date/Time:  Saturday December 03 2014 18:16:39 EDT Ventricular Rate:  74 PR Interval:  151 QRS Duration: 94 QT Interval:  370 QTC Calculation: 410 R Axis:   45 Text Interpretation:  Sinus rhythm Biatrial enlargement RSR' in V1 or V2,  right VCD or RVH No significant change since last tracing Confirmed by  [facility]  MD, Loree Fee ([id number]) on 12/03/2014 6:42:31 PM      MDM   Final diagnoses:  Encounter for medical assessment  Near syncope    Patient with some lightheadedness, palpitations, and flushed feeling after working out in the yard today. Symptoms have resolved now. We'll check basic labs, EKG, chest x-ray, and will reassess.  Patient is feeling better now. Symptoms have completely resolved. Labs and EKG are reassuring. We'll ambulate the patient. Suspect that the patient can be discharged to home.  8:58 PM Patient ambulates. She denies  any shortness of breath chest pain, dizziness, or palpitations. States that she feels well. I suspect the patient likely overworked herself in the yard today. Workup today is reassuring. Return precautions given. Patient understands and agrees to plan. She is well-appearing. She denies any additional complaints. She is stable and ready for discharge.      Montine Circle, PA-C 12/03/14 2059  Blanchie Dessert, MD 12/04/14 0010

## 2014-12-05 ENCOUNTER — Telehealth: Payer: Self-pay | Admitting: Cardiology

## 2014-12-05 NOTE — Telephone Encounter (Signed)
New message    Patient calling  Had an episode  on Saturday  afternoon went to emergency room.  Should she be seen or does the notes from ed need to be review.

## 2014-12-05 NOTE — Telephone Encounter (Signed)
Patient st she went to the ED for lightheadedness. She said they told her she was dehydrated, but never gave her fluids. She does not currently have any symptoms, but said she was a little "woozy" earlier today. She st she has an OV with her PCP tomorrow. She is to call our office after her OV tomorrow and give [place] an update and to see if cardiology FU is needed.

## 2014-12-08 NOTE — Telephone Encounter (Signed)
Left message to call back  

## 2014-12-09 NOTE — Telephone Encounter (Signed)
Left message for patient to call back if she has any questions, concerns, or wishes to make an OV.

## 2015-01-25 ENCOUNTER — Encounter: Payer: Self-pay | Admitting: Cardiology

## 2015-03-07 ENCOUNTER — Other Ambulatory Visit: Payer: Self-pay

## 2015-03-07 ENCOUNTER — Other Ambulatory Visit: Payer: Self-pay | Admitting: Cardiology

## 2015-03-07 MED ORDER — SOTALOL HCL 80 MG PO TABS: 40.0000 mg | ORAL_TABLET | Freq: Two times a day (BID) | ORAL | Status: DC

## 2015-04-08 ENCOUNTER — Encounter (HOSPITAL_COMMUNITY): Payer: Self-pay | Admitting: Emergency Medicine

## 2015-04-08 ENCOUNTER — Emergency Department (HOSPITAL_COMMUNITY)
Admission: EM | Admit: 2015-04-08 | Discharge: 2015-04-08 | Disposition: A | Discharge status: Home / Self Care | Payer: BC Managed Care – PPO | Attending: Emergency Medicine | Admitting: Emergency Medicine

## 2015-04-08 DIAGNOSIS — E780 Pure hypercholesterolemia: Secondary | ICD-10-CM | POA: Diagnosis not present

## 2015-04-08 DIAGNOSIS — Z86011 Personal history of benign neoplasm of the brain: Secondary | ICD-10-CM | POA: Insufficient documentation

## 2015-04-08 DIAGNOSIS — Z87891 Personal history of nicotine dependence: Secondary | ICD-10-CM | POA: Diagnosis not present

## 2015-04-08 DIAGNOSIS — Z8719 Personal history of other diseases of the digestive system: Secondary | ICD-10-CM | POA: Diagnosis not present

## 2015-04-08 DIAGNOSIS — I10 Essential (primary) hypertension: Secondary | ICD-10-CM | POA: Diagnosis not present

## 2015-04-08 DIAGNOSIS — Z8619 Personal history of other infectious and parasitic diseases: Secondary | ICD-10-CM | POA: Diagnosis not present

## 2015-04-08 DIAGNOSIS — I4891 Unspecified atrial fibrillation: Secondary | ICD-10-CM | POA: Insufficient documentation

## 2015-04-08 DIAGNOSIS — I252 Old myocardial infarction: Secondary | ICD-10-CM | POA: Diagnosis not present

## 2015-04-08 DIAGNOSIS — I471 Supraventricular tachycardia: Secondary | ICD-10-CM | POA: Diagnosis not present

## 2015-04-08 DIAGNOSIS — Z91040 Latex allergy status: Secondary | ICD-10-CM | POA: Insufficient documentation

## 2015-04-08 DIAGNOSIS — I499 Cardiac arrhythmia, unspecified: Secondary | ICD-10-CM | POA: Diagnosis present

## 2015-04-08 DIAGNOSIS — Z79899 Other long term (current) drug therapy: Secondary | ICD-10-CM | POA: Diagnosis not present

## 2015-04-08 DIAGNOSIS — Z85828 Personal history of other malignant neoplasm of skin: Secondary | ICD-10-CM | POA: Insufficient documentation

## 2015-04-08 DIAGNOSIS — Z7901 Long term (current) use of anticoagulants: Secondary | ICD-10-CM | POA: Diagnosis not present

## 2015-04-08 DIAGNOSIS — E669 Obesity, unspecified: Secondary | ICD-10-CM | POA: Diagnosis not present

## 2015-04-08 LAB — CBC
HEMATOCRIT: 40.3 % (ref 36.0–46.0)
Hemoglobin: 13.8 g/dL (ref 12.0–15.0)
MCH: 31.6 pg (ref 26.0–34.0)
MCHC: 34.2 g/dL (ref 30.0–36.0)
MCV: 92.2 fL (ref 78.0–100.0)
Platelets: 212 10*3/uL (ref 150–400)
RBC: 4.37 MIL/uL (ref 3.87–5.11)
RDW: 13.4 % (ref 11.5–15.5)
WBC: 8.5 10*3/uL (ref 4.0–10.5)

## 2015-04-08 LAB — BASIC METABOLIC PANEL
ANION GAP: 8 (ref 5–15)
BUN: 18 mg/dL (ref 6–20)
CO2: 26 mmol/L (ref 22–32)
CREATININE: 0.7 mg/dL (ref 0.44–1.00)
Calcium: 8.8 mg/dL — ABNORMAL LOW (ref 8.9–10.3)
Chloride: 103 mmol/L (ref 101–111)
GFR calc Af Amer: >60 mL/min (ref >60)
GLUCOSE: 113 mg/dL — AB (ref 65–99)
Potassium: 3.7 mmol/L (ref 3.5–5.1)
Sodium: 137 mmol/L (ref 135–145)

## 2015-04-08 NOTE — ED Notes (Signed)
Per EMS, patient is from home with complaints of "feeling like my heart is racing". On heart monitor, SVT with heart rate in 220s. Patient received 11m of adenosine, then 12mg  to have last heart rate of 85. 16g present in right ac.

## 2015-04-08 NOTE — ED Provider Notes (Signed)
CSN: [id number]     Arrival date & time 04/08/15  0142 History  This chart was scribed for Courtney Fuel, MD by Eustaquio Maize, ED Scribe. This patient was seen in room B17C/B17C and the patient's care was started at 2:07 AM.  Chief Complaint  Patient presents with  . Irregular Heart Beat   The history is provided by the patient. No language interpreter was used.     HPI Comments: Courtney Dunlap is a 64 y.o. female brought in by ambulance, with hx atrial fibrillation and HTN who presents to the Emergency Department complaining of irregular heart beat that occurred around 10 PM tonight (approximately 4 hours ago). Pt states that she was getting into bed when the symptoms began. She also notes a heaviness in her chest with onset. Pt checked her heart rate which she notes the highest at 120. Pt has had similar symptoms in the past. Per EMS, pt's heart rate was around 160. She was given 6 mg Adenosine and then 12 mg, bringing her heart rate down to 85 bpm. Pt notes to feeling tired now but is otherwise symptom free.   Past Medical History  Diagnosis Date  . A-fib     PAF 08/19/11 admitted due to AFIB with RVR  . Hyperthyroidism   . Angina   . Sleep apnea     severe with AHI 37/hr now on CPAP at 13cm H2O  . Cancer     skin  . Hypertension   . Hypercholesteremia   . GERD (gastroesophageal reflux disease)   . MI (myocardial infarction) 1/13  . Obesity   . Hiatal hernia     with GERD  . Shingles   . Mild cognitive impairment with memory loss   . Allergic dermatitis   . Lipoma     of the brain   Past Surgical History  Procedure Laterality Date  . Tubal ligation    . Tonsillectomy    . Dilation and curettage of uterus  1996     Polyp resection   . Hysteroscopy  4/12    D&C  . Appendectomy  28 years ago   Family History  Problem Relation Age of Onset  . Hypertension Mother   . Hypertension Sister   . Thyroid disease Sister   . Hypertension Sister   . Hypertension Brother   .  Thyroid disease Brother   . Diabetes Maternal Grandmother   . Heart attack Maternal Grandmother    Social History  Substance Use Topics  . Smoking status: Former Smoker -- 4 years    Types: Cigarettes  . Smokeless tobacco: Never Used     Comment: quit 35-40 years ago  . Alcohol Use: No   OB History    Gravida Para Term Preterm AB TAB SAB Ectopic Multiple Living   2 2 2       2      Review of Systems  All other systems reviewed and are negative.  Allergies  Codeine; Adhesive; and Latex  Home Medications   Prior to Admission medications   Medication Sig Start Date End Date Taking? Authorizing Provider  acetaminophen (TYLENOL) 500 MG tablet Take 500 mg by mouth every 6 (six) hours as needed for pain.    Historical Provider, MD  atorvastatin (LIPITOR) 20 MG tablet Take 20 mg by mouth daily at 6 PM.  07/27/13   Historical Provider, MD  Cyanocobalamin (VITAMIN B 12 PO) Take 1 tablet by mouth daily.     Historical Provider,  MD  diclofenac sodium (VOLTAREN) 1 % GEL Apply 2 g topically as needed (FOR KNEES).     Historical Provider, MD  hydrochlorothiazide (HYDRODIURIL) 25 MG tablet Take 1 tablet (25 mg total) by mouth daily. 11/01/14   Sueanne Margarita, MD  Omega-3 Fatty Acids (FISH OIL) 1000 MG CAPS Take 1,000 mg by mouth daily.    Historical Provider, MD  sotalol (BETAPACE) 80 MG tablet Take 0.5 tablets (40 mg total) by mouth 2 (two) times daily. 03/07/15   Sueanne Margarita, MD  vitamin B-12 (CYANOCOBALAMIN) 1000 MCG tablet Take 1,000 mcg by mouth daily.    Historical Provider, MD  Vitamin D, Ergocalciferol, (DRISDOL) 50000 UNITS CAPS capsule Take 50,000 Units by mouth 2 (two) times a week. TAKES ON WED AND SAT 07/27/13   Historical Provider, MD  warfarin (COUMADIN) 5 MG tablet Take 7.5-10 mg by mouth daily at 6 PM. Taking 1 1/2 tabs daily except Friday, take 2 tabs.    Historical Provider, MD   Triage Vitals: BP 153/84 mmHg  Pulse 91  Resp 19  Ht 5\' 2"  (1.575 m)  Wt 215 lb (97.523 kg)   BMI 39.31 kg/m2  SpO2 96%  LMP 11/21/2010   Physical Exam  Constitutional: She is oriented to person, place, and time. She appears well-developed and well-nourished. No distress.  HENT:  Head: Normocephalic and atraumatic.  Eyes: EOM are normal. Pupils are equal, round, and reactive to light.  Neck: Normal range of motion. Neck supple. No JVD present.  Cardiovascular: Normal rate, regular rhythm and normal heart sounds.  Exam reveals no gallop and no friction rub.   No murmur heard. Pulmonary/Chest: Effort normal and breath sounds normal. She has no wheezes. She has no rales. She exhibits no tenderness.  Abdominal: Soft. Bowel sounds are normal. She exhibits no distension and no mass. There is no tenderness.  Musculoskeletal: Normal range of motion. She exhibits edema. She exhibits no tenderness.  1+ pitting edema bilaterally.   Lymphadenopathy:    She has no cervical adenopathy.  Neurological: She is alert and oriented to person, place, and time. No cranial nerve deficit. She exhibits normal muscle tone. Coordination normal.  Skin: Skin is warm and dry. No rash noted.  Psychiatric: She has a normal mood and affect. Her behavior is normal. Judgment and thought content normal.  Nursing note and vitals reviewed.   ED Course  Procedures (including critical care time)  DIAGNOSTIC STUDIES: Oxygen Saturation is 96% on RA, normal by my interpretation.    COORDINATION OF CARE: 2:11 AM-Discussed treatment plan with pt at bedside and pt agreed to plan.   Labs Review Results for orders placed or performed during the hospital encounter of 04/08/15  CBC  Result Value Ref Range   WBC 8.5 4.0 - 10.5 K/uL   RBC 4.37 3.87 - 5.11 MIL/uL   Hemoglobin 13.8 12.0 - 15.0 g/dL   HCT 40.3 36.0 - 46.0 %   MCV 92.2 78.0 - 100.0 fL   MCH 31.6 26.0 - 34.0 pg   MCHC 34.2 30.0 - 36.0 g/dL   RDW 13.4 11.5 - 15.5 %   Platelets 212 150 - 400 K/uL  Basic metabolic panel  Result Value Ref Range   Sodium  137 135 - 145 mmol/L   Potassium 3.7 3.5 - 5.1 mmol/L   Chloride 103 101 - 111 mmol/L   CO2 26 22 - 32 mmol/L   Glucose, Bld 113 (H) 65 - 99 mg/dL   BUN 18 6 -  20 mg/dL   Creatinine, Ser 0.70 0.44 - 1.00 mg/dL   Calcium 8.8 (L) 8.9 - 10.3 mg/dL   GFR calc non Af Amer >60 >60 mL/min   GFR calc Af Amer >60 >60 mL/min   Anion gap 8 5 - 15   I have personally reviewed and evaluated these lab results as part of my medical decision-making.   EKG Interpretation   Date/Time:  Saturday April 08 2015 01:53:37 EDT Ventricular Rate:  82 PR Interval:  141 QRS Duration: 97 QT Interval:  356 QTC Calculation: 416 R Axis:   53 Text Interpretation:  Sinus rhythm LAE, consider biatrial enlargement RSR'  in V1 or V2, right VCD or RVH When compared with ECG of 12/03/2014, No  significant change was found Confirmed by [facility]  MD, Avenell Sellers ([id number]) on  04/08/2015 2:05:34 AM      MDM   Final diagnoses:  PSVT (paroxysmal supraventricular tachycardia)    Apparent episode of paroxysmal supraventricular tachycardia which was converted with adenosine prior to arrival in the ED. Old records are reviewed and she has diagnoses of paroxysmal atrial fibrillation, but this does not appear to have been an episode of that. She is been maintained in sinus rhythm in the ED have. She is discharged with instructions to follow-up with her cardiologist.   I personally performed the services described in this documentation, which was scribed in my presence. The recorded information has been reviewed and is accurate.       Courtney Fuel, MD 61/47/09 [id number]

## 2015-04-08 NOTE — Discharge Instructions (Signed)
Supraventricular Tachycardia °Supraventricular tachycardia (SVT) is an abnormal heart rhythm (arrhythmia) that causes the heart to beat very fast (tachycardia). This kind of fast heartbeat originates in the upper chambers of the heart (atria). SVT can cause the heart to beat greater than 100 beats per minute. SVT can have a rapid burst of heartbeats. This can start and stop suddenly without warning and is called nonsustained. SVT can also be sustained, in which the heart beats at a continuous fast rate.  °CAUSES  °There can be different causes of SVT. Some of these include: °· Heart valve problems such as mitral valve prolapse. °· An enlarged heart (hypertrophic cardiomyopathy). °· Congenital heart problems. °· Heart inflammation (pericarditis). °· Hyperthyroidism. °· Low potassium or magnesium levels. °· Caffeine. °· Drug use such as cocaine, methamphetamines, or stimulants. °· Some over-the-counter medicines such as: °¨ Decongestants. °¨ Diet medicines. °¨ Herbal medicines. °SYMPTOMS  °Symptoms of SVT can vary. Symptoms depend on whether the SVT is sustained or nonsustained. You may experience: °· No symptoms (asymptomatic). °· An awareness of your heart beating rapidly (palpitations). °· Shortness of breath. °· Chest pain or pressure. °If your blood pressure drops because of the SVT, you may experience: °· Fainting or near fainting. °· Weakness. °· Dizziness. °DIAGNOSIS  °Different tests can be performed to diagnose SVT, such as: °· An electrocardiogram (EKG). This is a painless test that records the electrical activity of your heart. °· Holter monitor. This is a 24 hour recording of your heart rhythm. You will be given a diary. Write down all symptoms that you have and what you were doing at the time you experienced symptoms. °· Arrhythmia monitor. This is a small device that your wear for several weeks. It records the heart rhythm when you have symptoms. °· Echocardiogram. This is an imaging test to help detect  abnormal heart structure such as congenital abnormalities, heart valve problems, or heart enlargement. °· Stress test. This test can help determine if the SVT is related to exercise. °· Electrophysiology study (EPS). This is a procedure that evaluates your heart's electrical system and can help your caregiver find the cause of your SVT. °TREATMENT  °Treatment of SVT depends on the symptoms, how often it recurs, and whether there are any underlying heart problems.  °· If symptoms are rare and no other cardiac disease is present, no treatment may be needed. °· Blood work may be done to check potassium, magnesium, and thyroid hormone levels to see if they are abnormal. If these levels are abnormal, treatment to correct the problems will occur. °Medicines °Your caregiver may use oral medicines to treat SVT. These medicines are given for long-term control of SVT. Medicines may be used alone or in combination with other treatments. These medicines work to slow nerve impulses in the heart muscle. These medicines can also be used to treat high blood pressure. Some of these medicines may include: °· Calcium channel blockers. °· Beta blockers. °· Digoxin. °Nonsurgical procedures °Nonsurgical techniques may be used if oral medicines do not work. Some examples include: °· Cardioversion. This technique uses either drugs or an electrical shock to restore a normal heart rhythm. °¨ Cardioversion drugs may be given through an intravenous (IV) line to help "reset" the heart rhythm. °¨ In electrical cardioversion, the caregiver shocks your heart to stop its beat for a split second. This helps to reset the heart to a normal rhythm. °· Ablation. This procedure is done under mild sedation. High frequency radio wave energy is used to   destroy the area of heart tissue responsible for the SVT. °HOME CARE INSTRUCTIONS  °· Do not smoke. °· Only take medicines prescribed by your caregiver. Check with your caregiver before using over-the-counter  medicines. °· Check with your caregiver about how much alcohol and caffeine (coffee, tea, colas, or chocolate) you may have. °· It is very important to keep all follow-up referrals and appointments in order to properly manage this problem. °SEEK IMMEDIATE MEDICAL CARE IF: °· You have dizziness. °· You faint or nearly faint. °· You have shortness of breath. °· You have chest pain or pressure. °· You have sudden nausea or vomiting. °· You have profuse sweating. °· You are concerned about how long your symptoms last. °· You are concerned about the frequency of your SVT episodes. °If you have the above symptoms, call your local emergency services (911 in [place] immediately. Do not drive yourself to the hospital. °MAKE SURE YOU:  °· Understand these instructions. °· Will watch your condition. °· Will get help right away if you are not doing well or get worse. °Document Released: 07/29/2005 Document Revised: 10/21/2011 Document Reviewed: 11/10/2008 °ExitCare® Patient Information ©2015 [employer]. This information is not intended to replace advice given to you by your health care provider. Make sure you discuss any questions you have with your health care provider. ° °

## 2015-04-10 ENCOUNTER — Telehealth: Payer: Self-pay | Admitting: Cardiology

## 2015-04-10 DIAGNOSIS — I471 Supraventricular tachycardia: Secondary | ICD-10-CM

## 2015-04-10 NOTE — Telephone Encounter (Signed)
Please order a 30 day heart monitor

## 2015-04-10 NOTE — Telephone Encounter (Signed)
New message     Pt was seen in e/r on Friday night Pt was diagnosed with PSVT that night Please call to discuss

## 2015-04-10 NOTE — Telephone Encounter (Signed)
Event monitor ordered. Patient agrees with treatment plan.

## 2015-04-10 NOTE — Telephone Encounter (Signed)
Patient st she went to the ED Friday because her HR was going up and down. In the ED, she reports her HR was 220. She was instructed to call our office today for follow-up. Today, BP=126/73 and HR=56. Patient st she has no SOB or CP, but sometimes her L arm feels "tingly" (usually relieved with movement), and her head feels "achy."  Patient had appointment scheduled for 9/12. Offered patient appointment tomorrow with Ellen Henri, but patient refused. She only wants to see Dr. Radford Pax. Rescheduled OV to Tuesday, 9/6 with Dr. Radford Pax per patient request. Patient st the scheduler told her Dr. Radford Pax is in the office this week and wants to know if Dr. Radford Pax will see her then instead.  To Dr. Radford Pax.

## 2015-04-12 ENCOUNTER — Ambulatory Visit (INDEPENDENT_AMBULATORY_CARE_PROVIDER_SITE_OTHER): Payer: BC Managed Care – PPO

## 2015-04-12 DIAGNOSIS — I471 Supraventricular tachycardia: Secondary | ICD-10-CM | POA: Diagnosis not present

## 2015-04-13 ENCOUNTER — Ambulatory Visit: Payer: BC Managed Care – PPO | Admitting: Physician Assistant

## 2015-04-17 NOTE — Progress Notes (Signed)
Cardiology Office Note   Date:  04/18/2015   ID:  Courtney Dunlap, DOB 1950/11/02, MRN [id number]  PCP:  Courtney Stack, MD    Chief Complaint  Patient presents with  . OSA      History of Present Illness: Courtney Dunlap is a 64 y.o. female with a history of PAF in the setting of hyperthyroidism, HTN and OSA on CPAP who is doing well. She was recently in the ER with complaints of irregular heart beat.  This occurred while getting into bed. She also had a heaviness in her chest at the same time.  She checked her heart rate and it was 120bpm.  She called EMS and her HR was 160bpm.  She was in SVT and was given Adenosine 6mg  and then 12mg  and HR came down to 85bpm.  Her CP stopped as soon as NSR was restored.  She is not drinking any caffeine or ETOH.  She presents today for followup doing well. She denies any further  chest pain, SOB, DOE (except with going up stairs) dizziness or syncope.  Occasionally she will have some LE edema. She tolerates the CPAP and uses a nasal pillow mask which she tolerates well.  She feels rested in the am and has no daytime sleepiness.    Past Medical History  Diagnosis Date  . A-fib     PAF 08/19/11 admitted due to AFIB with RVR  . Hyperthyroidism   . Angina   . Sleep apnea     severe with AHI 37/hr now on CPAP at 13cm H2O  . Cancer     skin  . Hypertension   . Hypercholesteremia   . GERD (gastroesophageal reflux disease)   . MI (myocardial infarction) 1/13  . Obesity   . Hiatal hernia     with GERD  . Shingles   . Mild cognitive impairment with memory loss   . Allergic dermatitis   . Lipoma     of the brain    Past Surgical History  Procedure Laterality Date  . Tubal ligation    . Tonsillectomy    . Dilation and curettage of uterus  1996     Polyp resection   . Hysteroscopy  4/12    D&C  . Appendectomy  28 years ago     Current Outpatient Prescriptions  Medication Sig Dispense Refill  . acetaminophen  (TYLENOL) 500 MG tablet Take 500 mg by mouth every 6 (six) hours as needed for pain.    [name] atorvastatin (LIPITOR) 20 MG tablet Take 20 mg by mouth daily at 6 PM.     . diclofenac sodium (VOLTAREN) 1 % GEL Apply 2 g topically as needed (FOR KNEES).     . hydrochlorothiazide (HYDRODIURIL) 25 MG tablet Take 1 tablet (25 mg total) by mouth daily. 90 tablet 1  . Omega-3 Fatty Acids (FISH OIL) 1000 MG CAPS Take 1,000 mg by mouth daily.    . sotalol (BETAPACE) 80 MG tablet Take 0.5 tablets (40 mg total) by mouth 2 (two) times daily. 90 tablet 3  . vitamin B-12 (CYANOCOBALAMIN) 1000 MCG tablet Take 1,000 mcg by mouth daily.    . Vitamin D, Ergocalciferol, (DRISDOL) 50000 UNITS CAPS capsule Take 50,000 Units by mouth every 7 (seven) days. On Saturday    . warfarin (COUMADIN) 5 MG tablet Take 7.5 mg by mouth daily.      No  current facility-administered medications for this visit.    Allergies:   Codeine; Adhesive; and Latex    Social History:  The patient  reports that she has quit smoking. Her smoking use included Cigarettes. She quit after 4 years of use. She has never used smokeless tobacco. She reports that she does not drink alcohol or use illicit drugs.   Family History:  The patient's family history includes Diabetes in her maternal grandmother; Heart attack in her maternal grandmother; Hypertension in her brother, mother, sister, and sister; Thyroid disease in her brother and sister.    ROS:  Please see the history of present illness.   Otherwise, review of systems are positive for none.   All other systems are reviewed and negative.    PHYSICAL EXAM: VS:  BP 102/64 mmHg  Pulse 74  Ht 5\' 2"  (1.575 m)  Wt 207 lb 12.8 oz (94.257 kg)  BMI 38.00 kg/m2  SpO2 97%  LMP 11/21/2010 , BMI Body mass index is 38 kg/(m^2). GEN: Well nourished, well developed, in no acute distress HEENT: normal Neck: no JVD, carotid bruits, or masses Cardiac: RRR; no murmurs, rubs, or gallops,no edema    Respiratory:  clear to auscultation bilaterally, normal work of breathing GI: soft, nontender, nondistended, + BS MS: no deformity or atrophy Skin: warm and dry, no rash Neuro:  Strength and sensation are intact Psych: euthymic mood, full affect   EKG:  EKG was ordered today and showed NSR with no ST changes    Recent Labs: 04/08/2015: BUN 18; Creatinine, Ser 0.70; Hemoglobin 13.8; Platelets 212; Potassium 3.7; Sodium 137    Lipid Panel No results found for: CHOL, TRIG, HDL, CHOLHDL, VLDL, LDLCALC, LDLDIRECT    Wt Readings from Last 3 Encounters:  04/18/15 207 lb 12.8 oz (94.257 kg)  04/08/15 215 lb (97.523 kg)  10/25/14 214 lb 12.8 oz (97.433 kg)     ASSESSMENT AND PLAN:  1. PAF maintaining NSR with no breakthrough - continue Betapace/warfarin 2. SVT responsive to adenosine.  Will start Toprol XL 12.5mg  daily 3. HTN well controlled. Stop HCTZ since we are starting low dose Toprol.    3.OSA on CPAP - I will get a d/l from her DME  Current medicines are reviewed at length with the patient today.  The patient does not have concerns regarding medicines.  The following changes have been made:  no change  Labs/ tests ordered today: See above Assessment and Plan No orders of the defined types were placed in this encounter.     Disposition:   FU with me in 6 months  Signed, Sueanne Margarita, MD  04/18/2015 2:29 PM    [facility] [address], [place], [place]  [address] Phone: [phone]; Fax: [phone]

## 2015-04-18 ENCOUNTER — Other Ambulatory Visit: Payer: Self-pay | Admitting: Radiology

## 2015-04-18 ENCOUNTER — Encounter: Payer: Self-pay | Admitting: Cardiology

## 2015-04-18 ENCOUNTER — Ambulatory Visit (INDEPENDENT_AMBULATORY_CARE_PROVIDER_SITE_OTHER): Payer: BC Managed Care – PPO | Admitting: Cardiology

## 2015-04-18 VITALS — BP 102/64 | HR 74 | Ht 62.0 in | Wt 207.8 lb

## 2015-04-18 DIAGNOSIS — I10 Essential (primary) hypertension: Secondary | ICD-10-CM

## 2015-04-18 DIAGNOSIS — I480 Paroxysmal atrial fibrillation: Secondary | ICD-10-CM | POA: Diagnosis not present

## 2015-04-18 DIAGNOSIS — G4733 Obstructive sleep apnea (adult) (pediatric): Secondary | ICD-10-CM | POA: Diagnosis not present

## 2015-04-18 MED ORDER — METOPROLOL SUCCINATE ER 25 MG PO TB24: 12.5000 mg | ORAL_TABLET | Freq: Every day | ORAL | Status: DC

## 2015-04-18 NOTE — Patient Instructions (Addendum)
Medication Instructions:  Your physician has recommended you make the following change in your medication:  1) START TOPROL 12.5 mg daily   Labwork: None  Testing/Procedures: None  Follow-Up: Your physician recommends that you schedule a follow-up appointment in 3 months with Dr. Radford Pax.  Supraventricular Tachycardia Supraventricular tachycardia (SVT) is when the heart beats very fast. SVT can last for a long time (sustained) or it can start and stop suddenly (nonsustained). HOME CARE   Take your heart medicine as told by your doctor. Check with your doctor before taking cold, diet, or herbal medicine.  Do not smoke.  Do not drink large amounts of caffeine.Caffeine is found in coffee, tea, soda (pop, cola), and chocolate.  Keep all doctor visits as told. GET HELP RIGHT AWAY IF:   You have chest pain or pressure.  You cannot catch your breath.  You are dizzy or lightheaded.  You feel like you will pass out (faint).  You are sweaty (diaphoretic) and feel sick to your stomach (nauseous) or throw up (vomit).  If you have the above problems, call your local emergency services (911 in [place] right away. Do not drive yourself to the hospital. MAKE SURE YOU:   Understand these instructions.  Will watch your condition.  Will get help right away if you are not doing well or get worse. Document Released: 07/29/2005 Document Revised: 10/21/2011 Document Reviewed: 11/02/2008 [facility] Patient Information 2015 [place], [place]. This information is not intended to replace advice given to you by your health care provider. Make sure you discuss any questions you have with your health care provider.

## 2015-04-19 ENCOUNTER — Other Ambulatory Visit: Payer: Self-pay

## 2015-04-19 MED ORDER — METOPROLOL SUCCINATE ER 25 MG PO TB24: 12.5000 mg | ORAL_TABLET | Freq: Every day | ORAL | Status: DC

## 2015-04-19 NOTE — Telephone Encounter (Signed)
Courtney Margarita, MD at 04/17/2015 11:24 AM  Medication Instructions:  Your physician has recommended you make the following change in your medication:  1) START TOPROL 12.5 mg daily   Pt called and stated her Metoprolol was supposed to be sent to [facility] on [address]. Sent in 30 day supply since refill already sent to her mail order pharmacy.

## 2015-04-20 ENCOUNTER — Telehealth: Payer: Self-pay | Admitting: *Deleted

## 2015-04-20 NOTE — Telephone Encounter (Signed)
Received call back from patient.Confirmed [facility] for 04/26/15 at 1230pm .  Instructions and contact information given.

## 2015-04-20 NOTE — Telephone Encounter (Signed)
Left message for a return phone call to schedule for [facility] 04/26/15,

## 2015-04-24 ENCOUNTER — Ambulatory Visit: Payer: BC Managed Care – PPO | Admitting: Cardiology

## 2015-04-24 ENCOUNTER — Encounter: Payer: Self-pay | Admitting: *Deleted

## 2015-04-24 ENCOUNTER — Other Ambulatory Visit: Payer: Self-pay | Admitting: *Deleted

## 2015-04-24 DIAGNOSIS — C50411 Malignant neoplasm of upper-outer quadrant of right female breast: Secondary | ICD-10-CM | POA: Insufficient documentation

## 2015-04-24 DIAGNOSIS — Z170 Estrogen receptor positive status [ER+]: Secondary | ICD-10-CM

## 2015-04-24 HISTORY — DX: Estrogen receptor positive status (ER+): Z17.0

## 2015-04-24 HISTORY — DX: Malignant neoplasm of upper-outer quadrant of right female breast: C50.411

## 2015-04-26 ENCOUNTER — Ambulatory Visit: Payer: Self-pay | Admitting: Surgery

## 2015-04-26 ENCOUNTER — Encounter: Payer: Self-pay | Admitting: General Practice

## 2015-04-26 ENCOUNTER — Ambulatory Visit
Admission: RE | Admit: 2015-04-26 | Discharge: 2015-04-26 | Disposition: A | Discharge status: Home / Self Care | Payer: BC Managed Care – PPO | Source: Ambulatory Visit | Attending: Radiation Oncology | Admitting: Radiation Oncology

## 2015-04-26 ENCOUNTER — Ambulatory Visit: Payer: BC Managed Care – PPO | Attending: Surgery | Admitting: Physical Therapy

## 2015-04-26 ENCOUNTER — Encounter: Payer: Self-pay | Admitting: Oncology

## 2015-04-26 ENCOUNTER — Other Ambulatory Visit (HOSPITAL_BASED_OUTPATIENT_CLINIC_OR_DEPARTMENT_OTHER): Payer: BC Managed Care – PPO

## 2015-04-26 ENCOUNTER — Telehealth: Payer: Self-pay | Admitting: Cardiology

## 2015-04-26 ENCOUNTER — Encounter: Payer: Self-pay | Admitting: Physical Therapy

## 2015-04-26 ENCOUNTER — Ambulatory Visit (HOSPITAL_BASED_OUTPATIENT_CLINIC_OR_DEPARTMENT_OTHER): Payer: BC Managed Care – PPO | Admitting: Oncology

## 2015-04-26 ENCOUNTER — Encounter: Payer: Self-pay | Admitting: Nurse Practitioner

## 2015-04-26 VITALS — BP 151/64 | HR 68 | Temp 98.8°F | Resp 18 | Ht 62.0 in | Wt 211.2 lb

## 2015-04-26 DIAGNOSIS — C50911 Malignant neoplasm of unspecified site of right female breast: Secondary | ICD-10-CM

## 2015-04-26 DIAGNOSIS — C50411 Malignant neoplasm of upper-outer quadrant of right female breast: Secondary | ICD-10-CM | POA: Diagnosis not present

## 2015-04-26 DIAGNOSIS — R293 Abnormal posture: Secondary | ICD-10-CM | POA: Diagnosis present

## 2015-04-26 DIAGNOSIS — Z01818 Encounter for other preprocedural examination: Secondary | ICD-10-CM

## 2015-04-26 LAB — CBC WITH DIFFERENTIAL/PLATELET
BASO%: 1 % (ref 0.0–2.0)
BASOS ABS: 0.1 10*3/uL (ref 0.0–0.1)
EOS%: 2.8 % (ref 0.0–7.0)
Eosinophils Absolute: 0.2 10*3/uL (ref 0.0–0.5)
HEMATOCRIT: 42 % (ref 34.8–46.6)
HGB: 14 g/dL (ref 11.6–15.9)
LYMPH#: 1.6 10*3/uL (ref 0.9–3.3)
LYMPH%: 23.2 % (ref 14.0–49.7)
MCH: 30.7 pg (ref 25.1–34.0)
MCHC: 33.3 g/dL (ref 31.5–36.0)
MCV: 92.1 fL (ref 79.5–101.0)
MONO#: 0.6 10*3/uL (ref 0.1–0.9)
MONO%: 9.3 % (ref 0.0–14.0)
NEUT#: 4.3 10*3/uL (ref 1.5–6.5)
NEUT%: 63.7 % (ref 38.4–76.8)
PLATELETS: 196 10*3/uL (ref 145–400)
RBC: 4.56 10*6/uL (ref 3.70–5.45)
RDW: 12.9 % (ref 11.2–14.5)
WBC: 6.8 10*3/uL (ref 3.9–10.3)

## 2015-04-26 LAB — COMPREHENSIVE METABOLIC PANEL (CC13)
ALT: 32 U/L (ref 0–55)
ANION GAP: 7 meq/L (ref 3–11)
AST: 27 U/L (ref 5–34)
Albumin: 3.9 g/dL (ref 3.5–5.0)
Alkaline Phosphatase: 108 U/L (ref 40–150)
BUN: 15.3 mg/dL (ref 7.0–26.0)
CALCIUM: 9.7 mg/dL (ref 8.4–10.4)
CO2: 28 meq/L (ref 22–29)
Chloride: 108 mEq/L (ref 98–109)
Creatinine: 0.8 mg/dL (ref 0.6–1.1)
EGFR: 83 mL/min/{1.73_m2} — ABNORMAL LOW (ref >90)
Glucose: 93 mg/dl (ref 70–140)
POTASSIUM: 4 meq/L (ref 3.5–5.1)
Sodium: 143 mEq/L (ref 136–145)
Total Bilirubin: 0.71 mg/dL (ref 0.20–1.20)
Total Protein: 7.3 g/dL (ref 6.4–8.3)

## 2015-04-26 NOTE — Telephone Encounter (Signed)
New message       Request for surgical clearance:  1. What type of surgery is being performed? Breast lumpectomy  2. When is this surgery scheduled? Pending clearance  3. Are there any medications that need to be held prior to surgery and how long? Stop coumadin?  If yes, how long and need medical clearance  4. Name of physician performing surgery?  Dr Mitzie Na  5. What is your office phone and fax number?  Fax [phone]

## 2015-04-26 NOTE — Progress Notes (Signed)
Ms. Martinique is a very pleasant 64 y.o. female from [place], [place] with newly diagnosed grade 2 invasive ductal carcinoma of the right breast.  Biopsy results revealed the tumor's prognostic profile is ER positive, PR positive, and HER2/neu negative. Ki67 is 3%.  She presents today with her husband and two daughters to the [facility]) for treatment consideration and recommendations from the breast surgeon, radiation oncologist, and medical oncologist.     I briefly met with Ms. Martinique, her husband, and their two daughters during her [facility] visit today. We discussed the purpose of the Survivorship Clinic, which will include monitoring for recurrence, coordinating completion of age and gender-appropriate cancer screenings, promotion of overall wellness, as well as managing potential late/long-term side effects of anti-cancer treatments.    The treatment plan for Ms. Martinique will likely include surgery, radiation therapy, and anti-estrogen therapy.  She will meet with the Genetics Counselor due to her family history of breast cancer. As of today, the intent of treatment for Ms. Martinique is cure, therefore she will be eligible for the Survivorship Clinic upon her completion of treatment.  Her survivorship care plan (SCP) document will be drafted and updated throughout the course of her treatment trajectory. She will receive the SCP in an office visit with myself in the Survivorship Clinic once she has completed treatment.   Ms. Martinique was encouraged to ask questions and all questions were answered to her satisfaction.  She was given my business card and encouraged to contact me with any concerns regarding survivorship.  I look forward to participating in her care.   Kenn File, [place] [phone]

## 2015-04-26 NOTE — Telephone Encounter (Signed)
Clearance to Dr. Radford Pax and Coumadin Clinic.

## 2015-04-26 NOTE — Progress Notes (Signed)
Radiation Oncology         [phone] ________________________________  Initial outpatient Consultation  Name: Courtney Dunlap MRN: [id number]  Date: 04/26/2015  DOB: 1951/06/23  LF:YBOFB,PZWCHEN Antony Haste, MD  Erroll Luna, MD   REFERRING PHYSICIAN: Erroll Luna, MD  DIAGNOSIS:    ICD-9-CM ICD-10-CM   1. 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 female who presented with a spiculated right breast lesion on screening mammography.  On ultrasound the lesion was 1.0 cm at the 9:00 position. The axilla was negative.  Biopsy showed invasive ductal carcinoma with characteristics as described above in the diagnosis.  She is in her USOH otherwise. She did not palpate anything prior to diagnosis.  PREVIOUS RADIATION THERAPY: No  PAST MEDICAL HISTORY:  has a past medical history of A-fib; Hyperthyroidism; Angina; Sleep apnea; Cancer; Hypertension; Hypercholesteremia; GERD (gastroesophageal reflux disease); MI (myocardial infarction) (1/13); Obesity; Hiatal hernia; Shingles; Mild cognitive impairment with memory loss; Allergic dermatitis; Lipoma; Breast cancer; Anxiety; and Depression.    PAST SURGICAL HISTORY: Past Surgical History  Procedure Laterality Date  . Tubal ligation    . Tonsillectomy    . Dilation and curettage of uterus  1996     Polyp resection   . Hysteroscopy  4/12    D&C  . Appendectomy  28 years ago    FAMILY HISTORY: family history includes Brain cancer in her mother; Breast cancer in her paternal aunt; Diabetes in her maternal grandmother; Heart attack in her maternal grandmother; Hypertension in her brother, mother, sister, and sister; Melanoma in her paternal aunt; Thyroid disease in her brother and sister.  SOCIAL HISTORY:  reports that she has quit smoking. Her smoking use  included Cigarettes. She quit after 4 years of use. She has never used smokeless tobacco. She reports that she does not drink alcohol or use illicit drugs.  ALLERGIES: Codeine; Adhesive; and Latex  MEDICATIONS:  Current Outpatient Prescriptions  Medication Sig Dispense Refill  . acetaminophen (TYLENOL) 500 MG tablet Take 500 mg by mouth every 6 (six) hours as needed for pain.    [name] atorvastatin (LIPITOR) 20 MG tablet Take 20 mg by mouth daily at 6 PM.     . diclofenac sodium (VOLTAREN) 1 % GEL Apply 2 g topically as needed (FOR KNEES).     . hydrochlorothiazide (HYDRODIURIL) 25 MG tablet Take 1 tablet (25 mg total) by mouth daily. (Patient not taking: Reported on 04/26/2015) 90 tablet 1  . metoprolol succinate (TOPROL-XL) 25 MG 24 hr tablet Take 0.5 tablets (12.5 mg total) by mouth daily. 15 tablet 0  . Omega-3 Fatty Acids (FISH OIL) 1000 MG CAPS Take 1,000 mg by mouth daily.    . sotalol (BETAPACE) 80 MG tablet Take 0.5 tablets (40 mg total) by mouth 2 (two) times daily. 90 tablet 3  . vitamin B-12 (CYANOCOBALAMIN) 1000 MCG tablet Take 1,000 mcg by mouth daily.    . Vitamin D, Ergocalciferol, (DRISDOL) 50000 UNITS CAPS capsule Take 50,000 Units by mouth every 7 (seven) days. On Saturday    . warfarin (COUMADIN) 5 MG tablet Take 7.5 mg by mouth daily.      No current facility-administered medications for this encounter.    REVIEW OF SYSTEMS:  Notable for that above.   PHYSICAL EXAM: Vitals with Age-Percentiles 04/26/2015  Length 157.5  cm  Systolic 696  Diastolic 64  MAP   Pulse 68  Respiration 18  Weight 95.8 kg  BMI 38.7  VISIT REPORT      General: Alert and oriented, in no acute distress HEENT: Head is normocephalic. Extraocular movements are intact. Oropharynx is clear. Neck: Neck is supple, no palpable cervical or supraclavicular lymphadenopathy. Heart: Regular in rate and rhythm with no murmurs, rubs, or gallops. Chest: Clear to auscultation bilaterally, with no rhonchi,  wheezes, or rales. Abdomen: Soft, mild tenderness in RLQ, nondistended, with no rigidity or guarding. Extremities: trace ankle edema on left Lymphatics: see Neck Exam Skin: No concerning lesions. Musculoskeletal: symmetric strength and muscle tone throughout. Neurologic: Cranial nerves II through XII are grossly intact. No obvious focalities. Speech is fluent. Coordination is intact. Psychiatric: Judgment and insight are intact. Affect is appropriate. Breasts: 1-1.5cm mass at 9:00 position of right breast; no other palpable masses in breasts or axillary regions bilaterally   ECOG = 0  0 - Asymptomatic (Fully active, able to carry on all predisease activities without restriction)  1 - Symptomatic but completely ambulatory (Restricted in physically strenuous activity but ambulatory and able to carry out work of a light or sedentary nature. For example, light housework, office work)  2 - Symptomatic, <50% in bed during the day (Ambulatory and capable of all self care but unable to carry out any work activities. Up and about more than 50% of waking hours)  3 - Symptomatic, >50% in bed, but not bedbound (Capable of only limited self-care, confined to bed or chair 50% or more of waking hours)  4 - Bedbound (Completely disabled. Cannot carry on any self-care. Totally confined to bed or chair)  5 - Death   Eustace Pen MM, Creech RH, Tormey DC, et al. [phone]). "Toxicity and response criteria of the [facility]". [place] Oncol. 5 (6): 649-55   LABORATORY DATA:  Lab Results  Component Value Date   WBC 6.8 04/26/2015   HGB 14.0 04/26/2015   HCT 42.0 04/26/2015   MCV 92.1 04/26/2015   PLT 196 04/26/2015   CMP     Component Value Date/Time   NA 143 04/26/2015 1243   NA 137 04/08/2015 0259   K 4.0 04/26/2015 1243   K 3.7 04/08/2015 0259   CL 103 04/08/2015 0259   CO2 28 04/26/2015 1243   CO2 26 04/08/2015 0259   GLUCOSE 93 04/26/2015 1243   GLUCOSE 113* 04/08/2015  0259   BUN 15.3 04/26/2015 1243   BUN 18 04/08/2015 0259   CREATININE 0.8 04/26/2015 1243   CREATININE 0.70 04/08/2015 0259   CALCIUM 9.7 04/26/2015 1243   CALCIUM 8.8* 04/08/2015 0259   PROT 7.3 04/26/2015 1243   PROT 7.0 08/16/2011 1445   ALBUMIN 3.9 04/26/2015 1243   ALBUMIN 3.4* 08/16/2011 1445   AST 27 04/26/2015 1243   AST 23 08/16/2011 1445   ALT 32 04/26/2015 1243   ALT 30 08/16/2011 1445   ALKPHOS 108 04/26/2015 1243   ALKPHOS 114 08/16/2011 1445   BILITOT 0.71 04/26/2015 1243   BILITOT 0.5 08/16/2011 1445   GFRNONAA >60 04/08/2015 0259   GFRAA >60 04/08/2015 0259         RADIOGRAPHY: as above    IMPRESSION/PLAN:She has been discussed at our multidisciplinary tumor board.  The consensus is that she would be a good candidate for breast conservation.   Oncotype testing anticipated post surgery.  We discussed the risks, benefits, and side effects of radiotherapy.  We discussed that radiation would take approximately 6 weeks to complete and that I would give the patient a few weeks to heal following surgery before starting treatment planning. We spoke about acute effects including skin irritation and fatigue as well as much less common late effects including lung  irritation. We spoke about the latest technology that is used to minimize the risk of late effects for breast cancer patients undergoing radiotherapy. No guarantees of treatment were given. The patient is enthusiastic about proceeding with treatment. I look forward to participating in the patient's care.    __________________________________________   Eppie Gibson, MD

## 2015-04-26 NOTE — Progress Notes (Signed)
[place] Psychosocial Distress Screening Spiritual Care  Shadowed by Counseling Intern Vaughan Sine, met with Ms Martinique and two daughters at [facility] to introduce [place] [facility] [place] team/resources, reviewing distress screen per protocol.  The patient scored a 8 on the Psychosocial Distress Thermometer which indicates severe distress. Also assessed for distress and other psychosocial needs.   ONCBCN DISTRESS SCREENING 04/26/2015  Screening Type Initial Screening  Distress experienced in past week (1-10) 8  Practical problem type Work/school  Emotional problem type Depression;Nervousness/Anxiety;Adjusting to illness  Information Concerns Type Lack of info about diagnosis;Lack of info about treatment  Physical Problem type Pain;Loss of appetitie;Constipation/diarrhea;Tingling hands/feet;Swollen arms/legs  Referral to support programs Yes  Other Spiritual Care, Counseling Intern   Ms Martinique reports resolution of information concerns and decrease in distress (now at 6) after [facility]  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  Per pt, depression/anxiety related to dx also.  She and family verbalized appreciation for variety of support disciplines and programs.  Dtrs interesting in supporting pt to get involved.  Follow up needed: No.  Pt and family report no other concerns at this time.  They have print materials about ongoing Support Team availability.  Per pt permission, Counseling Intern Vaughan Sine plans to f/u by phone in ca two weeks for further support.  Please also page as needs arise.  Thank you.  [place], [place] Pager [phone] Voicemail [phone]

## 2015-04-26 NOTE — Patient Instructions (Signed)

## 2015-04-26 NOTE — H&P (Signed)
Courtney Dunlap 04/26/2015 8:25 AM Location: [facility] Patient #: [id number] DOB: Jul 16, 1951 Undefined / Language: Courtney Dunlap / Race: White Female History of Present Illness Courtney Dunlap A. [name] MD; 04/26/2015 1:50 PM) Patient words: Right breast cancer    Pt sent at the request of Dr Isidore Moos for right breast cancer found on routine mammogram this year. Pt denies any pain, mass or discharge from her breast. She has had 3 paternal aunts wit breast cancer. [place] and U/S showed a 1 cm mass at 9 oclock core bx IDC ER+PR+HER 2 NEU NEGATIVE.  The patient is a 64 year old female   Other Problems Courtney Dunlap, [place]; 04/26/2015 8:25 AM) Anxiety Disorder Arthritis Atrial Fibrillation Back Pain Bladder Problems Breast Cancer Chest pain Cholelithiasis Gastroesophageal Reflux Disease High blood pressure Home Oxygen Use Hypercholesterolemia Lump In Breast Myocardial infarction Sleep Apnea Thyroid Disease Umbilical Hernia Repair  Past Surgical History Courtney Dunlap, CMA; 04/26/2015 8:25 AM) Appendectomy Breast Biopsy Right. Tonsillectomy  Diagnostic Studies History Courtney Dunlap, [place]; 04/26/2015 8:25 AM) Colonoscopy >10 years ago Mammogram within last year Pap Smear 1-5 years ago  Medication History Courtney Dunlap, [place]; 04/26/2015 8:25 AM) Medications Reconciled  Social History Courtney Dunlap, CMA; 04/26/2015 8:25 AM) Alcohol use Recently quit alcohol use. No caffeine use Tobacco use Former smoker.  Family History Courtney Dunlap, [place]; 04/26/2015 8:25 AM) Alcohol Abuse Father. Arthritis Sister. Bleeding disorder Sister. Breast Cancer Family Members In General, Sister. Cancer Family Members In General. Diabetes Mellitus Family Members In General. Heart Disease Family Members In General. Hypertension Sister. Kidney Disease Mother. Thyroid problems Family Members In General, Sister.  Pregnancy / Birth History Courtney Dunlap, [place]; 04/26/2015 8:25 AM) Age at menarche 77 years. Age of menopause 51-55 Contraceptive History Oral contraceptives. Gravida 2 Irregular periods Maternal age 76-20 Para 2     Review of Systems Courtney Dunlap CMA; 04/26/2015 8:25 AM) General Present- Appetite Loss, Chills and Fatigue. Not Present- Fever, Night Sweats, Weight Gain and Weight Loss. Skin Present- Change in Wart/Mole and Dryness. Not Present- Hives, Jaundice, New Lesions, Non-Healing Wounds, Rash and Ulcer. HEENT Present- Hoarseness and Wears glasses/contact lenses. Not Present- Earache, Hearing Loss, Nose Bleed, Oral Ulcers, Ringing in the Ears, Seasonal Allergies, Sinus Pain, Sore Throat, Visual Disturbances and Yellow Eyes. Respiratory Not Present- Bloody sputum, Chronic Cough, Difficulty Breathing, Snoring and Wheezing. Breast Not Present- Breast Mass, Breast Pain, Nipple Discharge and Skin Changes. Cardiovascular Present- Chest Pain, Palpitations, Rapid Heart Rate and Swelling of Extremities. Not Present- Difficulty Breathing Lying Down, Leg Cramps and Shortness of Breath. Gastrointestinal Present- Change in Bowel Habits. Not Present- Abdominal Pain, Bloating, Bloody Stool, Chronic diarrhea, Constipation, Difficulty Swallowing, Excessive gas, Gets full quickly at meals, Hemorrhoids, Indigestion, Nausea, Rectal Pain and Vomiting. Female Genitourinary Not Present- Frequency, Nocturia, Painful Urination, Pelvic Pain and Urgency. Musculoskeletal Present- Back Pain and Swelling of Extremities. Not Present- Joint Pain, Joint Stiffness, Muscle Pain and Muscle Weakness. Neurological Present- Numbness and Tingling. Not Present- Decreased Memory, Fainting, Headaches, Seizures, Tremor, Trouble walking and Weakness. Psychiatric Present- Anxiety and Change in Sleep Pattern. Not Present- Bipolar, Depression, Fearful and Frequent crying. Hematology Present- Easy Bruising. Not Present- Excessive bleeding, Gland problems, HIV  and Persistent Infections.   Physical Exam ([place] Schoffstall A. Dion Parrow MD; 04/26/2015 1:50 PM)  General Mental Status-Alert. General Appearance-Consistent with stated age. Hydration-Well hydrated. Voice-Normal.  Head and Neck Head-normocephalic, atraumatic with no lesions or palpable masses. Trachea-midline. Thyroid Gland Characteristics - normal size and consistency.  Eye Eyeball - Bilateral-Extraocular movements intact. Sclera/Conjunctiva -  Bilateral-No scleral icterus.  Chest and Lung Exam Chest and lung exam reveals -quiet, even and easy respiratory effort with no use of accessory muscles and on auscultation, normal breath sounds, no adventitious sounds and normal vocal resonance. Inspection Chest Wall - Normal. Back - normal.  Breast Breast - Left-Symmetric, Non Tender, No Biopsy scars, no Dimpling, No Inflammation, No Lumpectomy scars, No Mastectomy scars, No Peau d' Orange. Breast - Right-Symmetric, Non Tender, No Biopsy scars, no Dimpling, No Inflammation, No Lumpectomy scars, No Mastectomy scars, No Peau d' Orange. Breast Lump-No Palpable Breast Mass. Note: BRUISING AT [place]   Cardiovascular Cardiovascular examination reveals -normal heart sounds, regular rate and rhythm with no murmurs and normal pedal pulses bilaterally.  Abdomen Inspection Inspection of the abdomen reveals - No Hernias. Skin - Scar - no surgical scars. Palpation/Percussion Palpation and Percussion of the abdomen reveal - Soft, Non Tender, No Rebound tenderness, No Rigidity (guarding) and No hepatosplenomegaly. Auscultation Auscultation of the abdomen reveals - Bowel sounds normal.  Neurologic Neurologic evaluation reveals -alert and oriented x 3 with no impairment of recent or remote memory. Mental Status-Normal.  Musculoskeletal Normal Exam - Left-Upper Extremity Strength Normal and Lower Extremity Strength Normal. Normal Exam - Right-Upper Extremity Strength  Normal and Lower Extremity Strength Normal.  Lymphatic Head & Neck  General Head & Neck Lymphatics: Bilateral - Description - Normal. Axillary  General Axillary Region: Bilateral - Description - Normal. Tenderness - Non Tender. Femoral & Inguinal  Generalized Femoral & Inguinal Lymphatics: Bilateral - Description - Normal. Tenderness - Non Tender.    Assessment & Plan ([place] Cislo A. Sarinah Doetsch MD; 04/26/2015 1:53 PM)  BREAST CANCER, STAGE 1, RIGHT (C50.911) Impression: DISCUSSED BREAST CONSERVATION VS MASTECTOMY AND RECONSTRUCTION DISCUSSED SLN MAPPING SHE WOULD LIKE TO PROCEED WITH RIGHT BREAST SEED LOCALIZED LUMPECTOMY AND SLN MAPPING. Risk of lumpectomy include bleeding, infection, seroma, more surgery, use of seed/wire, wound care, cosmetic deformity and the need for other treatments, death , blood clots, death. Pt agrees to proceed. Risk of sentinel lymph node mapping include bleeding, infection, lymphedema, shoulder pain. stiffness, dye allergy. cosmetic deformity , blood clots, death, need for more surgery. Pt agres to proceed.  Atrial Fibrillation Impression: NEEDS CARDIAC CLEARANCE AND ANTICOAGULATION RECS.  High blood pressure

## 2015-04-26 NOTE — Telephone Encounter (Signed)
Patient agrees to [employer].  Reviewed instructions with patient and patient has no further questions.  Stress test ordered for scheduling.  Patient c/o mild bilateral foot/leg swelling. She also st she is not urinating as much. She wants to know if she can restart HCTZ.  To Dr. Radford Pax for medication recommendations.
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
## 2015-04-26 NOTE — Progress Notes (Signed)
[place]  [phone] [phone]     ID: Courtney Dunlap DOB: [birth year]  MR#: [id number]  UYQ#:[id number]  Patient Care Team: Reynold Bowen, MD as PCP - General (Endocrinology) Wenda Low, MD as Consulting Physician (Internal Medicine) Sueanne Margarita, MD as Consulting Physician (Cardiology) Erroll Luna, MD as Consulting Physician (General Surgery) Chauncey Cruel, MD as Consulting Physician (Oncology) Eppie Gibson, MD as Attending Physician (Radiation Oncology) Rockwell Germany, RN as Registered Nurse Mauro Kaufmann, RN as Registered Nurse PCP: [name], MD OTHER MD: Christene Slates M.D.  CHIEF COMPLAINT: Estrogen receptor positive breast cancer  CURRENT TREATMENT: Awaiting definitive surgery   BREAST CANCER HISTORY: Mayah had routine screening mammography at [facility] 04/11/2015 showing a new irregular lesion in the right breast. Diagnostic right unilateral mammogram and ultrasound 04/13/2015 found the breast density to be category B. In the right breast at the 9:00 position there was a mass with indistinct margins associated with architectural distortion. By ultrasound this was a 1 cm. Ultrasound of the axilla showed no abnormality.  Biopsy of the right breast mass in question 04/18/2015 showed ([id number]) and invasive ductal carcinoma, grade 2, estrogen receptor and progesterone receptor both 100% positive, with strong staining intensity, with an MIB-1 of 3%, and no HER-2 amplification, the signals ratio being 1.59 and the number per cell 2.30.  The patient's subsequent history is as detailed below.   INTERVAL HISTORY: [name] was evaluated in the multidisciplinary breast cancer clinic 04/26/2015 accompanied by her husband Rush Landmark and her daughter's Sharyn Lull and [name]. Her case was also presented in the multidisciplinary breast cancer conference that same morning. At that time a preliminary plan was proposed: Breast conserving  surgery with Oncotype testing, to be followed by radiation and anti-estrogens  REVIEW OF SYSTEMS: There were no specific symptoms leading to the original mammogram, which was routinely scheduled. The patient denies unusual headaches, visual changes, nausea, vomiting, stiff neck, dizziness, or gait imbalance. There has been no cough, phlegm production, or pleurisy, no chest pain or pressure, and no change in bowel or bladder habits. The patient denies fever, rash, bleeding, unexplained fatigue or unexplained weight loss. A detailed review of systems was otherwise entirely negative.  PAST MEDICAL HISTORY: Past Medical History  Diagnosis Date  . A-fib     PAF 08/19/11 admitted due to AFIB with RVR  . Hyperthyroidism   . Angina   . Sleep apnea     severe with AHI 37/hr now on CPAP at 13cm H2O  . Cancer     skin  . Hypertension   . Hypercholesteremia   . GERD (gastroesophageal reflux disease)   . MI (myocardial infarction) 1/13  . Obesity   . Hiatal hernia     with GERD  . Shingles   . Mild cognitive impairment with memory loss   . Allergic dermatitis   . Lipoma     of the brain  . Breast cancer   . Anxiety   . Depression     PAST SURGICAL HISTORY: Past Surgical History  Procedure Laterality Date  . Tubal ligation    . Tonsillectomy    . Dilation and curettage of uterus  1996     Polyp resection   . Hysteroscopy  4/12    D&C  . Appendectomy  28 years ago    FAMILY HISTORY Family History  Problem Relation Age of Onset  . Hypertension Mother   . Brain cancer Mother   . Hypertension Sister   .  Thyroid disease Sister   . Hypertension Sister   . Hypertension Brother   . Thyroid disease Brother   . Diabetes Maternal Grandmother   . Heart attack Maternal Grandmother   . Breast cancer Paternal Aunt   . Melanoma Paternal Aunt    the patient's father was diagnosed with squamous cell cancer of the throat at age 64 and died 54 years later. The patient's mother died from  end-stage renal disease at age 46. The patient had one brother, 2 sisters. There is no cancer in the immediate family except as noted, but one of the patient's father's 5 sisters was diagnosed with breast cancer before the age of 37. Another 1 had multiple myeloma. On the patient's mother sides there is a history of brain tumor in a niece.   GYNECOLOGIC HISTORY:  Patient's last menstrual period was 11/21/2010. Menarche age 35, first live birth age 82. The patient is GX P2. She went through menopause in 2002 or thereabouts. She did not take hormone replacement. 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 Her husband [name] is a retired Clinical biochemist. Daughter Shadawn "Sharyn Lull" [place] lives in [place] and is a housewife. Daughter "[name]" and Dunlap this in [place]. 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 The patient has 2 grandchildren and 3 great-grandchildren. She is a Psychologist, forensic.    ADVANCED DIRECTIVES: Not in place   HEALTH MAINTENANCE: Social History  Substance Use Topics  . Smoking status: Former Smoker -- 4 years    Types: Cigarettes  . Smokeless tobacco: Never Used     Comment: quit 35-40 years ago  . Alcohol Use: No     Colonoscopy: ?1997  PAP: FEB 2016  Bone density: 05/09/2009 at [place], normal  Lipid panel:  Allergies  Allergen Reactions  . Codeine Swelling    Facial/lips  . Adhesive [Tape] Itching and Rash    MUST USE PAPER TAPE-CAUSES BLISTERS AND BRUISES ALSO  . Latex Itching and Rash    Current Outpatient Prescriptions  Medication Sig Dispense Refill  . acetaminophen (TYLENOL) 500 MG tablet Take 500 mg by mouth every 6 (six) hours as needed for pain.    [name] atorvastatin (LIPITOR) 20 MG tablet Take 20 mg by mouth daily at 6 PM.     . diclofenac sodium (VOLTAREN) 1 %  GEL Apply 2 g topically as needed (FOR KNEES).     . metoprolol succinate (TOPROL-XL) 25 MG 24 hr tablet Take 0.5 tablets (12.5 mg total) by mouth daily. 15 tablet 0  . Omega-3 Fatty Acids (FISH OIL) 1000 MG CAPS Take 1,000 mg by mouth daily.    . sotalol (BETAPACE) 80 MG tablet Take 0.5 tablets (40 mg total) by mouth 2 (two) times daily. 90 tablet 3  . vitamin B-12 (CYANOCOBALAMIN) 1000 MCG tablet Take 1,000 mcg by mouth daily.    . Vitamin D, Ergocalciferol, (DRISDOL) 50000 UNITS CAPS capsule Take 50,000 Units by mouth every 7 (seven) days. On Saturday    . warfarin (COUMADIN) 5 MG tablet Take 7.5 mg by mouth daily.     . hydrochlorothiazide (HYDRODIURIL) 25 MG tablet Take 1 tablet (25 mg total) by mouth daily. (Patient not taking: Reported on 04/26/2015) 90 tablet 1   No current facility-administered medications for this visit.    OBJECTIVE: Middle-aged white woman who appears older than stated age; she  is wearing a Holter monitor Filed Vitals:   04/26/15 1300  BP: 151/64  Pulse: 68  Temp: 98.8 F (37.1 C)  Resp: 18     Body mass index is 38.62 kg/(m^2).    ECOG FS:1 - Symptomatic but completely ambulatory  Ocular: Sclerae unicteric, pupils equal, round and reactive to light Ear-nose-throat: Oropharynx clear and moist Lymphatic: No cervical or supraclavicular adenopathy Lungs no rales or rhonchi, good excursion bilaterally Heart regular rate and rhythm, no murmur appreciated Abd soft, obese, nontender, positive bowel sounds MSK no focal spinal tenderness, no joint edema Neuro: non-focal, well-oriented, appropriate affect Breasts: The right breast is status post recent biopsy and there is a moderate ecchymosis in the upper outer quadrant. There is also a palpable mass there measuring approximately 1/2 cm. There are no other skin or nipple changes of concern. The right axilla is benign. The left breast is unremarkable   LAB RESULTS:  CMP     Component Value Date/Time   NA 143  04/26/2015 1243   NA 137 04/08/2015 0259   K 4.0 04/26/2015 1243   K 3.7 04/08/2015 0259   CL 103 04/08/2015 0259   CO2 28 04/26/2015 1243   CO2 26 04/08/2015 0259   GLUCOSE 93 04/26/2015 1243   GLUCOSE 113* 04/08/2015 0259   BUN 15.3 04/26/2015 1243   BUN 18 04/08/2015 0259   CREATININE 0.8 04/26/2015 1243   CREATININE 0.70 04/08/2015 0259   CALCIUM 9.7 04/26/2015 1243   CALCIUM 8.8* 04/08/2015 0259   PROT 7.3 04/26/2015 1243   PROT 7.0 08/16/2011 1445   ALBUMIN 3.9 04/26/2015 1243   ALBUMIN 3.4* 08/16/2011 1445   AST 27 04/26/2015 1243   AST 23 08/16/2011 1445   ALT 32 04/26/2015 1243   ALT 30 08/16/2011 1445   ALKPHOS 108 04/26/2015 1243   ALKPHOS 114 08/16/2011 1445   BILITOT 0.71 04/26/2015 1243   BILITOT 0.5 08/16/2011 1445   GFRNONAA >60 04/08/2015 0259   GFRAA >60 04/08/2015 0259    INo results found for: SPEP, UPEP  Lab Results  Component Value Date   WBC 6.8 04/26/2015   NEUTROABS 4.3 04/26/2015   HGB 14.0 04/26/2015   HCT 42.0 04/26/2015   MCV 92.1 04/26/2015   PLT 196 04/26/2015      Chemistry      Component Value Date/Time   NA 143 04/26/2015 1243   NA 137 04/08/2015 0259   K 4.0 04/26/2015 1243   K 3.7 04/08/2015 0259   CL 103 04/08/2015 0259   CO2 28 04/26/2015 1243   CO2 26 04/08/2015 0259   BUN 15.3 04/26/2015 1243   BUN 18 04/08/2015 0259   CREATININE 0.8 04/26/2015 1243   CREATININE 0.70 04/08/2015 0259      Component Value Date/Time   CALCIUM 9.7 04/26/2015 1243   CALCIUM 8.8* 04/08/2015 0259   ALKPHOS 108 04/26/2015 1243   ALKPHOS 114 08/16/2011 1445   AST 27 04/26/2015 1243   AST 23 08/16/2011 1445   ALT 32 04/26/2015 1243   ALT 30 08/16/2011 1445   BILITOT 0.71 04/26/2015 1243   BILITOT 0.5 08/16/2011 1445       No results found for: LABCA2  No components found for: LABCA125  No results for input(s): INR in the last 168 hours.  Urinalysis    Component Value Date/Time   BILIRUBINUR n 09/30/2014 0911   PROTEINUR n  09/30/2014 0911   UROBILINOGEN negative 09/30/2014 0911   NITRITE n 09/30/2014 5397  LEUKOCYTESUR Negative 09/30/2014 0911    STUDIES: No results found.  ASSESSMENT: 64 y.o. [place] woman, status post right breast biopsy 04/18/2015 for a clinical T1b N0, stage IA invasive ductal carcinoma, grade 2, strongly estrogen and progesterone receptor positive, HER-2 not amplified, with an MIB-1 of 3%  (1) breast conserving surgery pending  (2) Oncotype DX will be requested to clarify the chemotherapy decision  (3) adjuvant radiation to follow  (4) the patient meets criteria for genetics testing  PLAN: We spent approximately one hour going over the biology of breast cancer and the details of the patient's situation. The patient understands that breast cancer treatment is best divided into local therapies and systemic therapy. The options for local therapy are surgery and radiation.  With regards to surgery it is important to note that there is no survival advantage to mastectomy as opposed to lumpectomy plus radiation. Accordingly breast conserving surgery with sentinel lymph node sampling followed by radiation is standard of care in most cases as far as local treatment is concerned, and that is our recommendation for her.  For systemic therapy there are 3 basic options: Anti-estrogens, anti-HER-2 immunotherapy, and chemotherapy. Which options apply in a particular case depends on the prognostic panel for the patient's specific tumor. In her case, antiestrogen therapy will be useful, effective, inexpensive, and generally is well tolerated. There is no indication for anti-HER-2 treatment.  The benefit of chemotherapy in her case is likely marginal since we are dealing with an estrogen receptor positive slowly growing tumor.. In many cases where the benefit of chemotherapy is borderline we will request a test, such as Oncotype or mammaprint, to give [place] additional information to help with this  decision. We will obtain that test in her case, from the definitive surgical specimen, and call her within 2 weeks of surgery with the results, which I expect will indicate no need for chemotherapy.  The general sequences to start with surgery, then chemotherapy if that is done, then radiation, and only after all that is completed is the antiestrogen therapy started. Accordingly she is going to see me again (assuming the Oncotype is "low-risk" as expected) in approximately 2-1/2 months. At that time we will discuss anastrozole.  Incidentally the patient qualifies for genetic testing. This will be set up within the next 2 weeks. She understands the chance of our finding and actionable notation is low, but it is sufficient that she warrants testing.  Clarene Critchley has a good understanding of the overall plan. She agrees with it. She knows the goal of treatment in her case is cure. She will call with any problems that may develop before her next visit here.  Chauncey Cruel, MD   04/26/2015 1:28 PM Medical Oncology and Hematology [facility] [address] [place], [place] [address] Tel. [phone]    Fax. [phone]

## 2015-04-26 NOTE — Telephone Encounter (Signed)
Given her recent Chest pain in setting of rapid afib - I would like her to have a Lexiscan myoview to rule out ischemia prior to clearing for surgery

## 2015-04-27 MED ORDER — HYDROCHLOROTHIAZIDE 12.5 MG PO CAPS: 12.5000 mg | ORAL_CAPSULE | Freq: Every day | ORAL | Status: DC

## 2015-04-27 NOTE — Telephone Encounter (Signed)
Per Dr. Radford Pax, patient may RESTART HCTZ at 12.5 mg daily. Refill called to [facility] per patient request. Patient understands she is awaiting stress test for surgical clearance and to hold HCTZ the morning of her test.

## 2015-04-27 NOTE — Telephone Encounter (Signed)
Pt has a CHADS score of 1.  Per protocol, okay to hold Coumadin x 5 days prior to procedure.

## 2015-05-01 ENCOUNTER — Ambulatory Visit (HOSPITAL_BASED_OUTPATIENT_CLINIC_OR_DEPARTMENT_OTHER): Payer: BC Managed Care – PPO | Admitting: Genetic Counselor

## 2015-05-01 ENCOUNTER — Other Ambulatory Visit (HOSPITAL_BASED_OUTPATIENT_CLINIC_OR_DEPARTMENT_OTHER): Payer: BC Managed Care – PPO

## 2015-05-01 ENCOUNTER — Encounter: Payer: Self-pay | Admitting: Genetic Counselor

## 2015-05-01 ENCOUNTER — Telehealth: Payer: Self-pay | Admitting: *Deleted

## 2015-05-01 DIAGNOSIS — Z803 Family history of malignant neoplasm of breast: Secondary | ICD-10-CM | POA: Diagnosis not present

## 2015-05-01 DIAGNOSIS — C50411 Malignant neoplasm of upper-outer quadrant of right female breast: Secondary | ICD-10-CM

## 2015-05-01 DIAGNOSIS — Z315 Encounter for genetic counseling: Secondary | ICD-10-CM

## 2015-05-01 LAB — CBC WITH DIFFERENTIAL/PLATELET
BASO%: 0.7 % (ref 0.0–2.0)
Basophils Absolute: 0.1 10*3/uL (ref 0.0–0.1)
EOS%: 2.8 % (ref 0.0–7.0)
Eosinophils Absolute: 0.2 10*3/uL (ref 0.0–0.5)
HEMATOCRIT: 42.3 % (ref 34.8–46.6)
HEMOGLOBIN: 14.5 g/dL (ref 11.6–15.9)
LYMPH#: 1.8 10*3/uL (ref 0.9–3.3)
LYMPH%: 27 % (ref 14.0–49.7)
MCH: 31.2 pg (ref 25.1–34.0)
MCHC: 34.3 g/dL (ref 31.5–36.0)
MCV: 91 fL (ref 79.5–101.0)
MONO#: 0.7 10*3/uL (ref 0.1–0.9)
MONO%: 10.5 % (ref 0.0–14.0)
NEUT%: 59 % (ref 38.4–76.8)
NEUTROS ABS: 4 10*3/uL (ref 1.5–6.5)
NRBC: 0 % (ref 0–0)
Platelets: 203 10*3/uL (ref 145–400)
RBC: 4.65 10*6/uL (ref 3.70–5.45)
RDW: 12.7 % (ref 11.2–14.5)
WBC: 6.8 10*3/uL (ref 3.9–10.3)

## 2015-05-01 LAB — COMPREHENSIVE METABOLIC PANEL (CC13)
ALBUMIN: 4 g/dL (ref 3.5–5.0)
ALK PHOS: 104 U/L (ref 40–150)
ALT: 28 U/L (ref 0–55)
ANION GAP: 8 meq/L (ref 3–11)
AST: 24 U/L (ref 5–34)
BUN: 15.7 mg/dL (ref 7.0–26.0)
CALCIUM: 9.6 mg/dL (ref 8.4–10.4)
CHLORIDE: 102 meq/L (ref 98–109)
CO2: 30 mEq/L — ABNORMAL HIGH (ref 22–29)
Creatinine: 0.9 mg/dL (ref 0.6–1.1)
EGFR: 70 mL/min/{1.73_m2} — AB (ref >90)
Glucose: 86 mg/dl (ref 70–140)
POTASSIUM: 4 meq/L (ref 3.5–5.1)
Sodium: 140 mEq/L (ref 136–145)
Total Bilirubin: 0.64 mg/dL (ref 0.20–1.20)
Total Protein: 7.5 g/dL (ref 6.4–8.3)

## 2015-05-01 NOTE — Progress Notes (Signed)
REFERRING PROVIDER: Reynold Bowen, MD [address], [place] [address]   Lurline Del, MD  PRIMARY PROVIDER:  [name], MD  PRIMARY REASON FOR VISIT:  1. Breast cancer of upper-outer quadrant of right female breast   2. Family history of breast cancer      HISTORY OF PRESENT ILLNESS:   Courtney Dunlap, a 64 y.o. female, was seen for a  cancer genetics consultation at the request of Dr. Jana Hakim due to a personal and family history of cancer.  Courtney Dunlap presents to clinic today to discuss the possibility of a hereditary predisposition to cancer, genetic testing, and to further clarify her future cancer risks, as well as potential cancer risks for family members.   In September 2016, at the age of 34, Courtney Dunlap was diagnosed with invasive ductal carcinoma of the right breast. The tumor is ER+/PR+/Her2-. This will be treated with surgery, radiation and hormones.    CANCER HISTORY:   No history exists.     HORMONAL RISK FACTORS:  Menarche was at age 29.  First live birth at age 21.  OCP use for approximately 18 years.  Ovaries intact: yes.  Hysterectomy: no.  Menopausal status:Menopause.  HRT use:0 years. Colonoscopy: yes, normal, but is 5 years over due. Mammogram within the last year: yes. Number of breast biopsies: 1. Up to date with pelvic exams:  yes. Any excessive radiation exposure in the past: no  Past Medical History  Diagnosis Date  . A-fib     PAF 08/19/11 admitted due to AFIB with RVR  . Hyperthyroidism   . Angina   . Sleep apnea     severe with AHI 37/hr now on CPAP at 13cm H2O  . Cancer     skin  . Hypertension   . Hypercholesteremia   . GERD (gastroesophageal reflux disease)   . MI (myocardial infarction) 1/13  . Obesity   . Hiatal hernia     with GERD  . Shingles   . Mild cognitive impairment with memory loss   . Allergic dermatitis   . Lipoma     of the brain  . Breast cancer   . Anxiety   . Depression   . Family  history of breast cancer     Past Surgical History  Procedure Laterality Date  . Tubal ligation    . Tonsillectomy    . Dilation and curettage of uterus  1996     Polyp resection   . Hysteroscopy  4/12    D&C  . Appendectomy  28 years ago    Social History   Social History  . Marital Status: Married    Spouse Name: N/A  . Number of Children: 2  . Years of Education: N/A   Occupational History  .  [place] History Main Topics  . Smoking status: Former Smoker -- 4 years    Types: Cigarettes  . Smokeless tobacco: Never Used     Comment: quit 35-40 years ago  . Alcohol Use: No  . Drug Use: No  . Sexual Activity: Not Currently   Other Topics Concern  . None   Social History Narrative     FAMILY HISTORY:  We obtained a detailed, 4-generation family history.  Significant diagnoses are listed below: Family History  Problem Relation Age of Onset  . Hypertension Mother   . Brain cancer Mother   . Hypertension Sister   . Thyroid disease Sister   . Hypertension Sister   .  Hypertension Brother   . Thyroid disease Brother   . Diabetes Maternal Grandmother   . Heart attack Maternal Grandmother   . Breast cancer Paternal Aunt     dx in her late 63s- early 22s  . Melanoma Paternal Aunt   . Breast cancer Paternal Aunt   . Multiple myeloma Paternal Aunt   . Other Daughter 73    ITP   The patient has two daughters who are cancer free, and two sisters and a brother who are healthy and cancer free.  Her father was a smoker and had throat cancer.  He had five sisters.  Two had breast cancer, one in her 30s-40s, and one had multiple myeloma.  A maternal cousin was diagnosed with a brain tumor in her 55s and died in her 30s.  Patient's maternal ancestors are of Caucasian descent, and paternal ancestors are of [place] descent. There is no reported Ashkenazi Jewish ancestry. There is no known consanguinity.  GENETIC COUNSELING ASSESSMENT: Courtney Dunlap is a  64 y.o. female with a personal and family history of breast cancer which somewhat suggestive of a hereditary cancer syndrome and predisposition to cancer. We, therefore, discussed and recommended the following at today's visit.   DISCUSSION: Based on the patient's family, we reviewed BRCA mutations, as well as some of the moderate risk genes, including CHEK2, PALB2 and ATM.  We reviewed the characteristics, features and inheritance patterns of hereditary cancer syndromes. We also discussed genetic testing, including the appropriate family members to test, the process of testing, insurance coverage and turn-around-time for results. We discussed the implications of a negative, positive and/or variant of uncertain significant result. We recommended Courtney Dunlap pursue genetic testing for the Breast/Ovarian cancer gene panel. The Breast/Ovarian gene panel offered by [facility] includes sequencing and rearrangement analysis for the following 20 genes:  ATM, BARD1, BRCA1, BRCA2, BRIP1, CDH1, CHEK2, EPCAM, FANCC, MLH1, MSH2, MSH6, NBN, PALB2, PMS2, PTEN, RAD51C, RAD51D, TP53, and XRCC2.     Based on Ms. Houpt personal and family history of cancer, she meets medical criteria for genetic testing. Despite that she meets criteria, she may still have an out of pocket cost. We discussed that if her out of pocket cost for testing is over $100, the laboratory will call and confirm whether she wants to proceed with testing.  If the out of pocket cost of testing is less than $100 she will be billed by the genetic testing laboratory.   PLAN: After considering the risks, benefits, and limitations, Courtney Dunlap  provided informed consent to pursue genetic testing and the blood sample was sent to [facility] for analysis of the Breast/Ovarian cancer panel. Results should be available within approximately 2-3 weeks' time, at which point they will be disclosed by telephone to Courtney Dunlap, as will any additional recommendations  warranted by these results. Courtney Dunlap will receive a summary of her genetic counseling visit and a copy of her results once available. This information will also be available in Epic. We encouraged Courtney Dunlap to remain in contact with cancer genetics annually so that we can continuously update the family history and inform her of any changes in cancer genetics and testing that may be of benefit for her family. Ms. Bitner questions were answered to her satisfaction today. Our contact information was provided should additional questions or concerns arise.  Lastly, we encouraged Courtney Dunlap to remain in contact with cancer genetics annually so that we can continuously update the family history and inform her  of any changes in cancer genetics and testing that may be of benefit for this family.   Ms.  Cercone questions were answered to her satisfaction today. Our contact information was provided should additional questions or concerns arise. Thank you for the referral and allowing [place] to share in the care of your patient.   Karen P. Florene Glen, [place], [facility] Certified Genetic Counselor [email or web address] phone: [phone]  The patient was seen for a total of 60 minutes in face-to-face genetic counseling.  This patient was discussed with Drs. Magrinat, Lindi Adie and/or Burr Medico who agrees with the above.    _______________________________________________________________________ For Office Staff:  Number of people involved in session: 2 Was an Intern/ student involved with case: no

## 2015-05-01 NOTE — Telephone Encounter (Signed)
Met with pt while she was waiting for genetic counseling. Relate doing well and without questions regarding dx or treatment care plan. Pt is waiting on cardiac clearance. Wearing cardiac monitor until Sept 30th. Will have Stress Test on 9/22. Encourage pt to call with needs. Received verbal understanding.

## 2015-05-02 ENCOUNTER — Telehealth (HOSPITAL_COMMUNITY): Payer: Self-pay | Admitting: *Deleted

## 2015-05-02 NOTE — Telephone Encounter (Signed)
Patient given detailed instructions per Myocardial Perfusion Study Information Sheet for test on 05/04/15 at 0915. Patient notified to arrive 15 minutes early and that it is imperative to arrive on time for appointment to keep from having the test rescheduled.  If you need to cancel or reschedule your appointment, please call the office within 24 hours of your appointment. Failure to do so may result in a cancellation of your appointment, and a $50 no show fee. Patient verbalized understanding. Courtney Dunlap

## 2015-05-02 NOTE — Telephone Encounter (Signed)
Left message on voicemail in reference to upcoming appointment scheduled for 05/04/15. Phone number given for a call back so details instructions can be given. Hasspacher, Ranae Palms

## 2015-05-04 ENCOUNTER — Ambulatory Visit (HOSPITAL_COMMUNITY): Payer: BC Managed Care – PPO | Attending: Cardiology

## 2015-05-04 VITALS — Ht 62.0 in | Wt 211.0 lb

## 2015-05-04 DIAGNOSIS — R0609 Other forms of dyspnea: Secondary | ICD-10-CM | POA: Insufficient documentation

## 2015-05-04 DIAGNOSIS — Z01818 Encounter for other preprocedural examination: Secondary | ICD-10-CM | POA: Diagnosis not present

## 2015-05-04 DIAGNOSIS — I10 Essential (primary) hypertension: Secondary | ICD-10-CM | POA: Insufficient documentation

## 2015-05-04 DIAGNOSIS — R0602 Shortness of breath: Secondary | ICD-10-CM

## 2015-05-04 DIAGNOSIS — R0789 Other chest pain: Secondary | ICD-10-CM

## 2015-05-04 LAB — MYOCARDIAL PERFUSION IMAGING
CSEPPHR: 82 {beats}/min
LHR: 0.29
LVDIAVOL: 109 mL
LVSYSVOL: 42 mL
Rest HR: 45 {beats}/min
SDS: 3
SRS: 5
SSS: 8
TID: 0.97

## 2015-05-04 MED ORDER — REGADENOSON 0.4 MG/5ML IV SOLN
0.4000 mg | Freq: Once | INTRAVENOUS | Status: AC
  Administered 2015-05-04: 0.4 mg via INTRAVENOUS

## 2015-05-04 MED ORDER — AMINOPHYLLINE 25 MG/ML IV SOLN
75.0000 mg | Freq: Once | INTRAVENOUS | Status: AC
  Administered 2015-05-04: 75 mg via INTRAVENOUS

## 2015-05-04 MED ORDER — TECHNETIUM TC 99M SESTAMIBI GENERIC - CARDIOLITE
31.1000 | Freq: Once | INTRAVENOUS | Status: AC | PRN
  Administered 2015-05-04: 31.1 via INTRAVENOUS

## 2015-05-04 MED ORDER — TECHNETIUM TC 99M SESTAMIBI GENERIC - CARDIOLITE
10.6000 | Freq: Once | INTRAVENOUS | Status: AC | PRN
  Administered 2015-05-04: 11 via INTRAVENOUS

## 2015-05-08 NOTE — Telephone Encounter (Signed)
Printed and placed in Medical Records "to be faxed" bin. 

## 2015-05-08 NOTE — Telephone Encounter (Signed)
Patient is low risk from cardiac standpoint for surgery.

## 2015-05-09 ENCOUNTER — Telehealth: Payer: Self-pay | Admitting: Cardiology

## 2015-05-09 NOTE — Telephone Encounter (Signed)
Informed pt of stress test results and that clearance was sent to [facility]. Pt verbalized understanding and was appreciative for call.

## 2015-05-09 NOTE — Telephone Encounter (Signed)
New message    Patient returning call back to nurse from yesterday.

## 2015-05-11 ENCOUNTER — Telehealth: Payer: Self-pay | Admitting: Cardiology

## 2015-05-11 NOTE — Telephone Encounter (Signed)
ERROR

## 2015-05-12 ENCOUNTER — Ambulatory Visit: Payer: Self-pay | Admitting: Genetic Counselor

## 2015-05-12 ENCOUNTER — Encounter: Payer: Self-pay | Admitting: Genetic Counselor

## 2015-05-12 ENCOUNTER — Telehealth: Payer: Self-pay | Admitting: Genetic Counselor

## 2015-05-12 DIAGNOSIS — Z803 Family history of malignant neoplasm of breast: Secondary | ICD-10-CM

## 2015-05-12 DIAGNOSIS — Z1379 Encounter for other screening for genetic and chromosomal anomalies: Secondary | ICD-10-CM

## 2015-05-12 DIAGNOSIS — C50411 Malignant neoplasm of upper-outer quadrant of right female breast: Secondary | ICD-10-CM

## 2015-05-12 HISTORY — DX: Encounter for other screening for genetic and chromosomal anomalies: Z13.79

## 2015-05-12 NOTE — Telephone Encounter (Signed)
Revealed negative genetic testing on the Breast/[place] cancer panel.

## 2015-05-12 NOTE — Progress Notes (Signed)
HPI: Courtney Dunlap was previously seen in the [facility] due to a personal and family history of breast cancer and concerns regarding a hereditary predisposition to cancer. Please refer to our prior cancer genetics clinic note for more information regarding Ms. Racey medical, social and family histories, and our assessment and recommendations, at the time. Ms. Maslin recent genetic test results were disclosed to her, as were recommendations warranted by these results. These results and recommendations are discussed in more detail below.  FAMILY HISTORY:  We obtained a detailed, 4-generation family history.  Significant diagnoses are listed below: Family History  Problem Relation Age of Onset  . Hypertension Mother   . Brain cancer Mother   . Hypertension Sister   . Thyroid disease Sister   . Hypertension Sister   . Hypertension Brother   . Thyroid disease Brother   . Diabetes Maternal Grandmother   . Heart attack Maternal Grandmother   . Breast cancer Paternal Aunt     dx in her late 57s- early 70s  . Melanoma Paternal Aunt   . Breast cancer Paternal Aunt   . Multiple myeloma Paternal Aunt   . Other Daughter 70    ITP    The patient has two daughters who are cancer free, and two sisters and a brother who are healthy and cancer free. Her father was a smoker and had throat cancer. He had five sisters. Two had breast cancer, one in her 30s-40s, and one had multiple myeloma. A maternal cousin was diagnosed with a brain tumor in her 40s and died in her 25s. Patient's maternal ancestors are of Caucasian descent, and paternal ancestors are of [place] descent. There is no reported Ashkenazi Jewish ancestry. There is no known consanguinity.  GENETIC TEST RESULTS: At the time of Ms. Catalina visit, we recommended she pursue genetic testing of the Breast/Ovarian cancer gene panel. The Breast/Ovarian gene panel offered by [facility] includes sequencing and rearrangement  analysis for the following 20 genes:  ATM, BARD1, BRCA1, BRCA2, BRIP1, CDH1, CHEK2, EPCAM, FANCC, MLH1, MSH2, MSH6, NBN, PALB2, PMS2, PTEN, RAD51C, RAD51D, TP53, and XRCC2.   The report date is May 11, 2015.  Genetic testing was normal, and did not reveal a deleterious mutation in these genes. The test report has been scanned into EPIC and is located under the Molecular Pathology section of the Results Review tab.   We discussed with Courtney Dunlap that since the current genetic testing is not perfect, it is possible there may be a gene mutation in one of these genes that current testing cannot detect, but that chance is small. We also discussed, that it is possible that another gene that has not yet been discovered, or that we have not yet tested, is responsible for the cancer diagnoses in the family, and it is, therefore, important to remain in touch with cancer genetics in the future so that we can continue to offer Courtney Dunlap the most up to date genetic testing.   CANCER SCREENING RECOMMENDATIONS: This result is reassuring and indicates that Courtney Dunlap likely does not have an increased risk for a future cancer due to a mutation in one of these genes. This normal test also suggests that Ms. Denapoli cancer was most likely not due to an inherited predisposition associated with one of these genes.  Most cancers happen by chance and this negative test suggests that her cancer falls into this category.  We, therefore, recommended she continue to follow the cancer management and  screening guidelines provided by her oncology and primary healthcare provider.   RECOMMENDATIONS FOR FAMILY MEMBERS: Women in this family might be at some increased risk of developing cancer, over the general population risk, simply due to the family history of cancer. We recommended women in this family have a yearly mammogram beginning at age 3, or 73 years younger than the earliest onset of cancer, an an annual clinical breast  exam, and perform monthly breast self-exams. Women in this family should also have a gynecological exam as recommended by their primary provider. All family members should have a colonoscopy by age 44.  FOLLOW-UP: Lastly, we discussed with Courtney Dunlap that cancer genetics is a rapidly advancing field and it is possible that new genetic tests will be appropriate for her and/or her family members in the future. We encouraged her to remain in contact with cancer genetics on an annual basis so we can update her personal and family histories and let her know of advances in cancer genetics that may benefit this family.   Our contact number was provided. Ms. Rominger questions were answered to her satisfaction, and she knows she is welcome to call us at anytime with additional questions or concerns.   Roma Kayser, MS, [facility] Certified Genetic Counselor [email or web address]

## 2015-05-16 ENCOUNTER — Encounter (HOSPITAL_COMMUNITY): Payer: Self-pay

## 2015-05-16 ENCOUNTER — Encounter: Payer: Self-pay | Admitting: Anatomic Pathology & Clinical Pathology

## 2015-05-16 ENCOUNTER — Encounter (HOSPITAL_COMMUNITY)
Admission: RE | Admit: 2015-05-16 | Discharge: 2015-05-16 | Disposition: A | Discharge status: Home / Self Care | Payer: BC Managed Care – PPO | Source: Ambulatory Visit | Attending: Surgery | Admitting: Surgery

## 2015-05-16 ENCOUNTER — Other Ambulatory Visit: Payer: Self-pay | Admitting: Cardiology

## 2015-05-16 VITALS — BP 122/53 | HR 68 | Temp 98.2°F | Resp 18 | Ht 62.5 in | Wt 210.1 lb

## 2015-05-16 DIAGNOSIS — Z91040 Latex allergy status: Secondary | ICD-10-CM | POA: Diagnosis not present

## 2015-05-16 DIAGNOSIS — M1990 Unspecified osteoarthritis, unspecified site: Secondary | ICD-10-CM | POA: Diagnosis not present

## 2015-05-16 DIAGNOSIS — G4733 Obstructive sleep apnea (adult) (pediatric): Secondary | ICD-10-CM | POA: Diagnosis not present

## 2015-05-16 DIAGNOSIS — I252 Old myocardial infarction: Secondary | ICD-10-CM | POA: Diagnosis not present

## 2015-05-16 DIAGNOSIS — K219 Gastro-esophageal reflux disease without esophagitis: Secondary | ICD-10-CM | POA: Diagnosis not present

## 2015-05-16 DIAGNOSIS — Z170 Estrogen receptor positive status [ER+]: Secondary | ICD-10-CM | POA: Diagnosis not present

## 2015-05-16 DIAGNOSIS — E7800 Pure hypercholesterolemia, unspecified: Secondary | ICD-10-CM | POA: Diagnosis not present

## 2015-05-16 DIAGNOSIS — Z87891 Personal history of nicotine dependence: Secondary | ICD-10-CM | POA: Diagnosis not present

## 2015-05-16 DIAGNOSIS — I10 Essential (primary) hypertension: Secondary | ICD-10-CM | POA: Diagnosis not present

## 2015-05-16 DIAGNOSIS — Z888 Allergy status to other drugs, medicaments and biological substances status: Secondary | ICD-10-CM | POA: Diagnosis not present

## 2015-05-16 DIAGNOSIS — Z803 Family history of malignant neoplasm of breast: Secondary | ICD-10-CM | POA: Diagnosis not present

## 2015-05-16 DIAGNOSIS — Z885 Allergy status to narcotic agent status: Secondary | ICD-10-CM | POA: Diagnosis not present

## 2015-05-16 DIAGNOSIS — C50911 Malignant neoplasm of unspecified site of right female breast: Secondary | ICD-10-CM | POA: Diagnosis present

## 2015-05-16 DIAGNOSIS — Z6837 Body mass index (BMI) 37.0-37.9, adult: Secondary | ICD-10-CM | POA: Diagnosis not present

## 2015-05-16 DIAGNOSIS — I4891 Unspecified atrial fibrillation: Secondary | ICD-10-CM | POA: Diagnosis not present

## 2015-05-16 DIAGNOSIS — F329 Major depressive disorder, single episode, unspecified: Secondary | ICD-10-CM | POA: Diagnosis not present

## 2015-05-16 DIAGNOSIS — F419 Anxiety disorder, unspecified: Secondary | ICD-10-CM | POA: Diagnosis not present

## 2015-05-16 HISTORY — DX: Unspecified osteoarthritis, unspecified site: M19.90

## 2015-05-16 LAB — COMPREHENSIVE METABOLIC PANEL
ALBUMIN: 3.7 g/dL (ref 3.5–5.0)
ALT: 39 U/L (ref 14–54)
ANION GAP: 7 (ref 5–15)
AST: 33 U/L (ref 15–41)
Alkaline Phosphatase: 102 U/L (ref 38–126)
BUN: 17 mg/dL (ref 6–20)
CHLORIDE: 106 mmol/L (ref 101–111)
CO2: 26 mmol/L (ref 22–32)
Calcium: 9.3 mg/dL (ref 8.9–10.3)
Creatinine, Ser: 0.81 mg/dL (ref 0.44–1.00)
GFR calc non Af Amer: >60 mL/min (ref >60)
Glucose, Bld: 86 mg/dL (ref 65–99)
Potassium: 4 mmol/L (ref 3.5–5.1)
SODIUM: 139 mmol/L (ref 135–145)
Total Bilirubin: 0.8 mg/dL (ref 0.3–1.2)
Total Protein: 6.9 g/dL (ref 6.5–8.1)

## 2015-05-16 LAB — CBC WITH DIFFERENTIAL/PLATELET
BASOS PCT: 1 %
Basophils Absolute: 0.1 10*3/uL (ref 0.0–0.1)
EOS ABS: 0.2 10*3/uL (ref 0.0–0.7)
EOS PCT: 3 %
HCT: 41.9 % (ref 36.0–46.0)
Hemoglobin: 13.8 g/dL (ref 12.0–15.0)
LYMPHS ABS: 1.9 10*3/uL (ref 0.7–4.0)
Lymphocytes Relative: 23 %
MCH: 30.7 pg (ref 26.0–34.0)
MCHC: 32.9 g/dL (ref 30.0–36.0)
MCV: 93.3 fL (ref 78.0–100.0)
MONOS PCT: 10 %
Monocytes Absolute: 0.8 10*3/uL (ref 0.1–1.0)
Neutro Abs: 5 10*3/uL (ref 1.7–7.7)
Neutrophils Relative %: 63 %
PLATELETS: 219 10*3/uL (ref 150–400)
RBC: 4.49 MIL/uL (ref 3.87–5.11)
RDW: 12.6 % (ref 11.5–15.5)
WBC: 7.9 10*3/uL (ref 4.0–10.5)

## 2015-05-16 LAB — PROTIME-INR
INR: 1.17 (ref 0.00–1.49)
Prothrombin Time: 15.1 seconds (ref 11.6–15.2)

## 2015-05-16 NOTE — Pre-Procedure Instructions (Signed)
Courtney Dunlap  05/16/2015      [facility] - [place], [place] [address] Phone: [phone] Fax: [phone]  [facility] [place], [place] [address] [place] [address] Phone: [phone] Fax: [phone]  [id number] [place], [place] - [place] [address] [place] [address] Phone: [phone] Fax: [phone]    Your procedure is scheduled on 05/18/2015.  Report to [facility] Admitting at 5:30  A.M.  Call this number if you have problems the morning of surgery:  [phone]   Remember:  Do not eat food or drink liquids after midnight.  On wednesday  Take these medicines the morning of surgery with A SIP OF WATER : Metoprolol, Solatol    Do not wear jewelry, make-up or nail polish.   Do not wear lotions, powders, or perfumes.     Do not shave 48 hours prior to surgery.    Do not bring valuables to the hospital.   [facility] is not responsible for any belongings or valuables.  Contacts, dentures or bridgework may not be worn into surgery.  Leave your suitcase in the car.  After surgery it may be brought to your room.  For patients admitted to the hospital, discharge time will be determined by your treatment team.  Patients discharged the day of surgery will not be allowed to drive home.   Name and phone number of your driver:   With family Special instructions:  Special Instructions: [place] - Preparing for Surgery  Before surgery, you can play an important role.  Because skin is not sterile, your skin needs to be as free of germs as possible.  You can reduce the number of germs on you skin by washing with CHG (chlorahexidine gluconate) soap before surgery.  CHG is an antiseptic cleaner which kills germs and bonds  with the skin to continue killing germs even after washing.  Please DO NOT use if you have an allergy to CHG or antibacterial soaps.  If your skin becomes reddened/irritated stop using the CHG and inform your nurse when you arrive at Short Stay.  Do not shave (including legs and underarms) for at least 48 hours prior to the first CHG shower.  You may shave your face.  Please follow these instructions carefully:   1.  Shower with CHG Soap the night before surgery and the  morning of Surgery.  2.  If you choose to wash your hair, wash your hair first as usual with your  normal shampoo.  3.  After you shampoo, rinse your hair and body thoroughly to remove the  Shampoo.  4.  Use CHG as you would any other liquid soap.  You can apply chg directly to the skin and wash gently with scrungie or a clean washcloth.  5.  Apply the CHG Soap to your body ONLY FROM THE NECK DOWN.    Do not use on open wounds or open sores.  Avoid contact with your eyes, ears, mouth and genitals (private parts).  Wash genitals (private parts)   with your normal soap.  6.  Wash thoroughly, paying special attention to the area where your surgery will be performed.  7.  Thoroughly rinse your body with warm water from the neck down.  8.  DO NOT shower/wash with your normal soap after using and rinsing off   the CHG Soap.  9.  Pat yourself dry with a clean towel.            10.  Wear clean pajamas.            11.  Place clean sheets on your bed the night of your first shower and do not sleep with pets.  Day of Surgery  Do not apply any lotions/deodorants the morning of surgery.  Please wear clean clothes to the hospital/surgery center.  Please read over the following fact sheets that you were given. Pain Booklet, Coughing and Deep Breathing and Surgical Site Infection Prevention

## 2015-05-16 NOTE — Progress Notes (Signed)
Pt. Followed by Dr. Ashok Norris, recently seen & told to hold Coumadin starting 10/1. Pt. Denies all chest complaints.

## 2015-05-16 NOTE — Progress Notes (Addendum)
Pending Research Encounter  Courtney Dunlap  Pathology Clinical Research  Cell [phone]

## 2015-05-17 DIAGNOSIS — C50911 Malignant neoplasm of unspecified site of right female breast: Secondary | ICD-10-CM | POA: Diagnosis not present

## 2015-05-17 MED ORDER — DEXTROSE 5 % IV SOLN
3.0000 g | INTRAVENOUS | Status: AC
  Administered 2015-05-18: 3 g via INTRAVENOUS
  Filled 2015-05-17: qty 3000

## 2015-05-17 MED ORDER — CHLORHEXIDINE GLUCONATE 4 % EX LIQD: 1.0000 "application " | Freq: Once | CUTANEOUS | Status: DC

## 2015-05-17 NOTE — Anesthesia Preprocedure Evaluation (Addendum)
Anesthesia Evaluation  Patient identified by MRN, date of birth, ID band Patient awake    Reviewed: Allergy & Precautions, H&P , NPO status , Patient's Chart, lab work & pertinent test results, reviewed documented beta blocker date and time   Airway Mallampati: III  TM Distance: >3 FB Neck ROM: Full    Dental no notable dental hx. (+) Teeth Intact, Dental Advisory Given   Pulmonary shortness of breath, sleep apnea and Continuous Positive Airway Pressure Ventilation , former smoker,    Pulmonary exam normal breath sounds clear to auscultation       Cardiovascular hypertension, Pt. on medications and Pt. on home beta blockers + Past MI  negative cardio ROS  + dysrhythmias Atrial Fibrillation  Rhythm:Regular Rate:Normal     Neuro/Psych Anxiety Depression negative neurological ROS     GI/Hepatic Neg liver ROS, GERD  Medicated and Controlled,  Endo/Other  Hyperthyroidism Morbid obesity  Renal/GU negative Renal ROS  negative genitourinary   Musculoskeletal  (+) Arthritis , Osteoarthritis,    Abdominal   Peds  Hematology negative hematology ROS (+)   Anesthesia Other Findings No view past tongue  Reproductive/Obstetrics negative OB ROS                           Anesthesia Physical Anesthesia Plan  ASA: III  Anesthesia Plan: General and Regional   Post-op Pain Management: GA combined w/ Regional for post-op pain   Induction: Intravenous  Airway Management Planned: LMA  Additional Equipment:   Intra-op Plan:   Post-operative Plan: Extubation in OR  Informed Consent: I have reviewed the patients History and Physical, chart, labs and discussed the procedure including the risks, benefits and alternatives for the proposed anesthesia with the patient or authorized representative who has indicated his/her understanding and acceptance.   Dental advisory given  Plan Discussed with:  CRNA  Anesthesia Plan Comments:        Anesthesia Quick Evaluation

## 2015-05-17 NOTE — Progress Notes (Signed)
Anesthesia Chart Review: Patient is a 64 year old female scheduled for right breast seed localized partial mastectomy and SN mapping tomorrow by Dr. Brantley Stage.  History includes former smoker, afib/PAF '13 in the setting of hyperthyroidism with NSTEMI (due to demand ischemia with rapid HR), chest pain with SVT 04/08/15 responsive to adenosine, HTN, hypercholesteremia, GERD, hiatal hiatal, mild memory loss, anxiety, depression, breast cancer, OSA on CPAP, tonsillectomy, appendectomy. PCP is Dr. Forde Dandy.   Meds include Lipitor, HCTZ, Toprol XL, fish oil, sotalol, warfarin. Warfarin held starting 05/13/15.  Cardiologist Dr. Radford Pax (last visit 04/18/15) felt patient was low CV risk for surgery.   04/08/15 EKG: SR, LAE, consider bi-atrial enlargement, RSR prime in V1 or V2, consider right VCD or RVH.   05/04/15 Nuclear stress test:   Nuclear stress EF: 61%.  There was no ST segment deviation noted during stress.  The study is normal. No ischemia. No infarction.  This is a low risk study.  08/16/11 Echo: Study Conclusions - Left ventricle: The cavity size was normal. Wall thickness was normal. Systolic function was normal. The estimated ejection fraction was in the range of 60% to 65%. Features are consistent with a pseudonormal left ventricular filling pattern, with concomitant abnormal relaxation and increased filling pressure (grade 2 diastolic dysfunction). Doppler parameters are consistent with high ventricular filling pressure. - Right ventricle: Systolic pressure was mildly to moderately increased. - Atrial septum: No defect or patent foramen ovale was identified. - Pulmonary arteries: PA peak pressure: 77mm Hg (S).  04/12/15 Normal heart monitor (SB/NSR ranging from 51-83 bpm.  Preoperative labs WNL.   If no acute changes then I anticipate that she can proceed as planned.  George Hugh [facility] Short Stay Center/Anesthesiology Phone [phone] 05/17/2015  1:19 PM

## 2015-05-18 ENCOUNTER — Encounter (HOSPITAL_COMMUNITY): Payer: Self-pay | Admitting: *Deleted

## 2015-05-18 ENCOUNTER — Ambulatory Visit (HOSPITAL_COMMUNITY): Payer: BC Managed Care – PPO | Admitting: Vascular Surgery

## 2015-05-18 ENCOUNTER — Ambulatory Visit (HOSPITAL_COMMUNITY): Payer: BC Managed Care – PPO | Admitting: Anesthesiology

## 2015-05-18 ENCOUNTER — Encounter (HOSPITAL_COMMUNITY)
Admission: RE | Disposition: A | Discharge status: Home / Self Care | Payer: Self-pay | Source: Ambulatory Visit | Attending: Surgery

## 2015-05-18 ENCOUNTER — Encounter (HOSPITAL_COMMUNITY)
Admission: RE | Admit: 2015-05-18 | Discharge: 2015-05-18 | Disposition: A | Discharge status: Home / Self Care | Payer: BC Managed Care – PPO | Source: Ambulatory Visit | Attending: Surgery | Admitting: Surgery

## 2015-05-18 ENCOUNTER — Ambulatory Visit (HOSPITAL_BASED_OUTPATIENT_CLINIC_OR_DEPARTMENT_OTHER)
Admission: RE | Admit: 2015-05-18 | Discharge: 2015-05-18 | Disposition: A | Discharge status: Home / Self Care | Payer: BC Managed Care – PPO | Source: Ambulatory Visit | Attending: Surgery | Admitting: Surgery

## 2015-05-18 DIAGNOSIS — G4733 Obstructive sleep apnea (adult) (pediatric): Secondary | ICD-10-CM | POA: Insufficient documentation

## 2015-05-18 DIAGNOSIS — C50911 Malignant neoplasm of unspecified site of right female breast: Secondary | ICD-10-CM | POA: Insufficient documentation

## 2015-05-18 DIAGNOSIS — F419 Anxiety disorder, unspecified: Secondary | ICD-10-CM | POA: Insufficient documentation

## 2015-05-18 DIAGNOSIS — Z6837 Body mass index (BMI) 37.0-37.9, adult: Secondary | ICD-10-CM | POA: Insufficient documentation

## 2015-05-18 DIAGNOSIS — I252 Old myocardial infarction: Secondary | ICD-10-CM | POA: Insufficient documentation

## 2015-05-18 DIAGNOSIS — Z888 Allergy status to other drugs, medicaments and biological substances status: Secondary | ICD-10-CM | POA: Insufficient documentation

## 2015-05-18 DIAGNOSIS — F329 Major depressive disorder, single episode, unspecified: Secondary | ICD-10-CM | POA: Insufficient documentation

## 2015-05-18 DIAGNOSIS — M1990 Unspecified osteoarthritis, unspecified site: Secondary | ICD-10-CM | POA: Insufficient documentation

## 2015-05-18 DIAGNOSIS — E7800 Pure hypercholesterolemia, unspecified: Secondary | ICD-10-CM | POA: Insufficient documentation

## 2015-05-18 DIAGNOSIS — Z885 Allergy status to narcotic agent status: Secondary | ICD-10-CM | POA: Insufficient documentation

## 2015-05-18 DIAGNOSIS — Z87891 Personal history of nicotine dependence: Secondary | ICD-10-CM | POA: Insufficient documentation

## 2015-05-18 DIAGNOSIS — I4891 Unspecified atrial fibrillation: Secondary | ICD-10-CM | POA: Insufficient documentation

## 2015-05-18 DIAGNOSIS — Z803 Family history of malignant neoplasm of breast: Secondary | ICD-10-CM | POA: Insufficient documentation

## 2015-05-18 DIAGNOSIS — Z91040 Latex allergy status: Secondary | ICD-10-CM | POA: Insufficient documentation

## 2015-05-18 DIAGNOSIS — Z170 Estrogen receptor positive status [ER+]: Secondary | ICD-10-CM | POA: Insufficient documentation

## 2015-05-18 DIAGNOSIS — I10 Essential (primary) hypertension: Secondary | ICD-10-CM | POA: Insufficient documentation

## 2015-05-18 DIAGNOSIS — K219 Gastro-esophageal reflux disease without esophagitis: Secondary | ICD-10-CM | POA: Insufficient documentation

## 2015-05-18 HISTORY — PX: RADIOACTIVE SEED GUIDED PARTIAL MASTECTOMY WITH AXILLARY SENTINEL LYMPH NODE BIOPSY: SHX6520

## 2015-05-18 SURGERY — RADIOACTIVE SEED GUIDED PARTIAL MASTECTOMY WITH AXILLARY SENTINEL LYMPH NODE BIOPSY
Anesthesia: Regional | Site: Breast | Laterality: Right

## 2015-05-18 MED ORDER — DEXTROSE 5 % IV SOLN
10.0000 mg | INTRAVENOUS | Status: DC | PRN
  Administered 2015-05-18: 40 ug/min via INTRAVENOUS

## 2015-05-18 MED ORDER — ONDANSETRON HCL 4 MG/2ML IJ SOLN
INTRAMUSCULAR | Status: DC | PRN
  Administered 2015-05-18: 4 mg via INTRAVENOUS

## 2015-05-18 MED ORDER — MIDAZOLAM HCL 2 MG/2ML IJ SOLN
INTRAMUSCULAR | Status: AC
  Filled 2015-05-18: qty 4

## 2015-05-18 MED ORDER — DEXTROSE 5 % IV SOLN
INTRAVENOUS | Status: DC | PRN
  Administered 2015-05-18: 08:00:00 via INTRAVENOUS

## 2015-05-18 MED ORDER — BUPIVACAINE-EPINEPHRINE (PF) 0.5% -1:200000 IJ SOLN
INTRAMUSCULAR | Status: DC | PRN
  Administered 2015-05-18: 30 mL

## 2015-05-18 MED ORDER — FENTANYL CITRATE (PF) 250 MCG/5ML IJ SOLN
INTRAMUSCULAR | Status: AC
  Filled 2015-05-18: qty 5

## 2015-05-18 MED ORDER — SODIUM CHLORIDE 0.9 % IJ SOLN
INTRAMUSCULAR | Status: AC
  Filled 2015-05-18: qty 10

## 2015-05-18 MED ORDER — MIDAZOLAM HCL 5 MG/5ML IJ SOLN
INTRAMUSCULAR | Status: DC | PRN
  Administered 2015-05-18: 0.5 mg via INTRAVENOUS
  Administered 2015-05-18: 1 mg via INTRAVENOUS

## 2015-05-18 MED ORDER — BUPIVACAINE-EPINEPHRINE (PF) 0.25% -1:200000 IJ SOLN
INTRAMUSCULAR | Status: AC
  Filled 2015-05-18: qty 30

## 2015-05-18 MED ORDER — LIDOCAINE HCL (CARDIAC) 20 MG/ML IV SOLN
INTRAVENOUS | Status: AC
  Filled 2015-05-18: qty 5

## 2015-05-18 MED ORDER — BUPIVACAINE-EPINEPHRINE 0.25% -1:200000 IJ SOLN
INTRAMUSCULAR | Status: DC | PRN
  Administered 2015-05-18: 5 mL

## 2015-05-18 MED ORDER — EPHEDRINE SULFATE 50 MG/ML IJ SOLN
INTRAMUSCULAR | Status: DC | PRN
  Administered 2015-05-18 (×2): 5 mg via INTRAVENOUS

## 2015-05-18 MED ORDER — 0.9 % SODIUM CHLORIDE (POUR BTL) OPTIME
TOPICAL | Status: DC | PRN
  Administered 2015-05-18: 1000 mL

## 2015-05-18 MED ORDER — HYDROMORPHONE HCL 1 MG/ML IJ SOLN
0.2500 mg | INTRAMUSCULAR | Status: DC | PRN
  Administered 2015-05-18 (×4): 0.5 mg via INTRAVENOUS

## 2015-05-18 MED ORDER — ONDANSETRON HCL 4 MG/2ML IJ SOLN
INTRAMUSCULAR | Status: AC
  Filled 2015-05-18: qty 2

## 2015-05-18 MED ORDER — TECHNETIUM TC 99M SULFUR COLLOID FILTERED
1.0000 | Freq: Once | INTRAVENOUS | Status: AC | PRN
  Administered 2015-05-18: 1 via INTRADERMAL

## 2015-05-18 MED ORDER — FENTANYL CITRATE (PF) 100 MCG/2ML IJ SOLN
INTRAMUSCULAR | Status: DC | PRN
  Administered 2015-05-18 (×2): 50 ug via INTRAVENOUS

## 2015-05-18 MED ORDER — EPHEDRINE SULFATE 50 MG/ML IJ SOLN
INTRAMUSCULAR | Status: AC
  Filled 2015-05-18: qty 1

## 2015-05-18 MED ORDER — HYDROCODONE-ACETAMINOPHEN 5-325 MG PO TABS: 1.0000 | ORAL_TABLET | Freq: Four times a day (QID) | ORAL | Status: DC | PRN

## 2015-05-18 MED ORDER — LACTATED RINGERS IV SOLN
INTRAVENOUS | Status: DC | PRN
  Administered 2015-05-18: 07:00:00 via INTRAVENOUS

## 2015-05-18 MED ORDER — TRAMADOL HCL 50 MG PO TABS: 50.0000 mg | ORAL_TABLET | Freq: Four times a day (QID) | ORAL | Status: DC | PRN

## 2015-05-18 MED ORDER — PROPOFOL 10 MG/ML IV BOLUS
INTRAVENOUS | Status: AC
  Filled 2015-05-18: qty 20

## 2015-05-18 MED ORDER — HYDROMORPHONE HCL 1 MG/ML IJ SOLN
INTRAMUSCULAR | Status: AC
  Administered 2015-05-18: 0.5 mg via INTRAVENOUS
  Filled 2015-05-18: qty 1

## 2015-05-18 MED ORDER — METHYLENE BLUE 1 % INJ SOLN
INTRAMUSCULAR | Status: AC
  Filled 2015-05-18: qty 10

## 2015-05-18 MED ORDER — DEXAMETHASONE SODIUM PHOSPHATE 4 MG/ML IJ SOLN
INTRAMUSCULAR | Status: DC | PRN
  Administered 2015-05-18: 8 mg via INTRAVENOUS

## 2015-05-18 MED ORDER — ARTIFICIAL TEARS OP OINT
TOPICAL_OINTMENT | OPHTHALMIC | Status: AC
  Filled 2015-05-18: qty 3.5

## 2015-05-18 MED ORDER — DEXAMETHASONE SODIUM PHOSPHATE 4 MG/ML IJ SOLN
INTRAMUSCULAR | Status: AC
  Filled 2015-05-18: qty 2

## 2015-05-18 SURGICAL SUPPLY — 55 items
APPLIER CLIP 9.375 MED OPEN (MISCELLANEOUS) ×2
APR CLP MED 9.3 20 MLT OPN (MISCELLANEOUS) ×1
BINDER BREAST LRG (GAUZE/BANDAGES/DRESSINGS) IMPLANT
BINDER BREAST XLRG (GAUZE/BANDAGES/DRESSINGS) ×1 IMPLANT
BLADE SURG 15 STRL LF DISP TIS (BLADE) ×1 IMPLANT
BLADE SURG 15 STRL SS (BLADE) ×2
CANISTER SUCTION 2500CC (MISCELLANEOUS) ×1 IMPLANT
CHLORAPREP W/TINT 26ML (MISCELLANEOUS) ×2 IMPLANT
CLIP APPLIE 9.375 MED OPEN (MISCELLANEOUS) ×1 IMPLANT
CONT SPEC 4OZ CLIKSEAL STRL BL (MISCELLANEOUS) ×3 IMPLANT
COVER PROBE W GEL 5X96 (DRAPES) ×2 IMPLANT
DEVICE DUBIN SPECIMEN MAMMOGRA (MISCELLANEOUS) ×2 IMPLANT
DRAPE CHEST BREAST 15X10 FENES (DRAPES) ×2 IMPLANT
DRAPE UTILITY XL STRL (DRAPES) ×1 IMPLANT
ELECT CAUTERY BLADE 6.4 (BLADE) ×2 IMPLANT
ELECT REM PT RETURN 9FT ADLT (ELECTROSURGICAL) ×2
ELECTRODE REM PT RTRN 9FT ADLT (ELECTROSURGICAL) ×1 IMPLANT
GLOVE BIO SURGEON STRL SZ8 (GLOVE) ×2 IMPLANT
GLOVE BIOGEL PI IND STRL 7.0 (GLOVE) IMPLANT
GLOVE BIOGEL PI IND STRL 8 (GLOVE) ×1 IMPLANT
GLOVE BIOGEL PI IND STRL 8.5 (GLOVE) IMPLANT
GLOVE BIOGEL PI INDICATOR 7.0 (GLOVE) ×1
GLOVE BIOGEL PI INDICATOR 8 (GLOVE) ×1
GLOVE BIOGEL PI INDICATOR 8.5 (GLOVE) ×1
GLOVE SURG SS PI 7.0 STRL IVOR (GLOVE) ×2 IMPLANT
GLOVE SURG SS PI 8.0 STRL IVOR (GLOVE) ×1 IMPLANT
GLOVE SURG SS PI 8.5 STRL IVOR (GLOVE) ×1
GLOVE SURG SS PI 8.5 STRL STRW (GLOVE) IMPLANT
GOWN STRL REUS W/ TWL LRG LVL3 (GOWN DISPOSABLE) ×1 IMPLANT
GOWN STRL REUS W/ TWL XL LVL3 (GOWN DISPOSABLE) ×1 IMPLANT
GOWN STRL REUS W/TWL LRG LVL3 (GOWN DISPOSABLE) ×2
GOWN STRL REUS W/TWL XL LVL3 (GOWN DISPOSABLE) ×2
KIT BASIN OR (CUSTOM PROCEDURE TRAY) ×2 IMPLANT
KIT MARKER MARGIN INK (KITS) ×2 IMPLANT
LIQUID BAND (GAUZE/BANDAGES/DRESSINGS) ×1 IMPLANT
NDL HYPO 25GX1X1/2 BEV (NEEDLE) IMPLANT
NDL SAFETY ECLIPSE 18X1.5 (NEEDLE) IMPLANT
NEEDLE HYPO 18GX1.5 SHARP (NEEDLE)
NEEDLE HYPO 25GX1X1/2 BEV (NEEDLE) ×2 IMPLANT
NS IRRIG 1000ML POUR BTL (IV SOLUTION) IMPLANT
PACK SURGICAL SETUP 50X90 (CUSTOM PROCEDURE TRAY) ×2 IMPLANT
PENCIL BUTTON HOLSTER BLD 10FT (ELECTRODE) ×2 IMPLANT
SPONGE LAP 18X18 X RAY DECT (DISPOSABLE) ×2 IMPLANT
SUT MNCRL AB 4-0 PS2 18 (SUTURE) ×2 IMPLANT
SUT SILK 2 0 SH (SUTURE) ×1 IMPLANT
SUT VIC AB 2-0 SH 27 (SUTURE) ×2
SUT VIC AB 2-0 SH 27XBRD (SUTURE) ×1 IMPLANT
SUT VIC AB 3-0 SH 27 (SUTURE) ×2
SUT VIC AB 3-0 SH 27X BRD (SUTURE) ×1 IMPLANT
SYR BULB 3OZ (MISCELLANEOUS) ×2 IMPLANT
SYR CONTROL 10ML LL (SYRINGE) ×2 IMPLANT
TOWEL OR 17X24 6PK STRL BLUE (TOWEL DISPOSABLE) ×1 IMPLANT
TOWEL OR 17X26 10 PK STRL BLUE (TOWEL DISPOSABLE) ×2 IMPLANT
TUBE CONNECTING 12X1/4 (SUCTIONS) ×2 IMPLANT
YANKAUER SUCT BULB TIP NO VENT (SUCTIONS) ×1 IMPLANT

## 2015-05-18 NOTE — Op Note (Signed)
Preoperative diagnosis: Stage I right breast cancer  Postoperative diagnosis: Same  Procedure: Right breast seed localized partial mastectomy with right axillary sentinel lymph node mapping  Surgeon: Erroll Luna M.D.  Anesthesia: LMA with pectoral block per anesthesia and 0.25% Sensorcaine local with epinephrine  Drains: None  EBL: Minimal  Specimen: Right breast mass with seed clipped verified by x-ray and 3 right axillary sentinel nodes  Indications for procedure: Patient is a 64 year old female with stage I right breast cancer. She was seen in the multidisciplinary breast clinic and elected to have breast conservation. We discussed surgery in contrast to the to mastectomy with reconstruction. Adjuvant therapies were discussed as well.The procedure has been discussed with the patient. Alternatives to surgery have been discussed with the patient.  Risks of surgery include bleeding,  Infection,  Seroma formation, death,  and the need for further surgery.   The patient understands and wishes to proceed.Sentinel lymph node mapping and dissection has been discussed with the patient.  Risk of bleeding, lymphedema,  Infection,  Seroma formation,  Additional procedures,,  Shoulder weakness ,  Shoulder stiffness,  Nerve and blood vessel injury and reaction to the mapping dyes have been discussed.  Alternatives to surgery have been discussed with the patient.  The patient agrees to proceed.  Description of procedure: The patient was met in the holding area. Neoprobe was used to verify seed location. The right breast was marked as the correct side. Pectoral block was placed and nuclear medicine injected the tracer. Questions are answered and right side was marked as correct and the presence patient. Patient was taken back to the operating room and placed supine on the OR table. After induction of LMA anesthesia, the right chest and right axilla were prepped and draped in a sterile fashion. Timeout was  done. Proper patient, procedure site reviewed and found to be correct. The neoprobe was used and the cecum was identified. Curvilinear incision was made in the lateral right breast dissection was carried down around the tissue where the cecum was located. Gross margins were achieved. Negative. Radiograph revealed the seating clip to be in the center of the specimen. Neoprobe was then switched technetium settings and to the same incision the sentinel nodes were identified. These are level I nodes and 3 were removed. Hemostasis was achieved. Cavity is irrigated clips are placed to mark lumpectomy cavity. Hemostasis achieved. Cavity closed with 3-0 Vicryl and 4-0 Monocryl subcuticular fashion. Liquid adhesive applied as well as a breast binder. Patient awoke extubated taken recovery in satisfactory condition. All final counts found to be correct.

## 2015-05-18 NOTE — H&P (View-Only) (Signed)
Courtney Dunlap 04/26/2015 8:25 AM Location: [facility] Patient #: [id number] DOB: 08/11/1951 Undefined / Language: Courtney Dunlap / Race: White Female History of Present Illness Courtney Moores A. Haylei Cobin MD; 04/26/2015 1:50 PM) Patient words: Right breast cancer    Pt sent at the request of Dr Isidore Moos for right breast cancer found on routine mammogram this year. Pt denies any pain, mass or discharge from her breast. She has had 3 paternal aunts wit breast cancer. Maoomgram and U/S showed a 1 cm mass at 9 oclock core bx IDC ER+PR+HER 2 NEU NEGATIVE.  The patient is a 64 year old female   Other Problems [name], [place]; 04/26/2015 8:25 AM) Anxiety Disorder Arthritis Atrial Fibrillation Back Pain Bladder Problems Breast Cancer Chest pain Cholelithiasis Gastroesophageal Reflux Disease High blood pressure Home Oxygen Use Hypercholesterolemia Lump In Breast Myocardial infarction Sleep Apnea Thyroid Disease Umbilical Hernia Repair  Past Surgical History [name], CMA; 04/26/2015 8:25 AM) Appendectomy Breast Biopsy Right. Tonsillectomy  Diagnostic Studies History [name], [place]; 04/26/2015 8:25 AM) Colonoscopy >10 years ago Mammogram within last year Pap Smear 1-5 years ago  Medication History [name], [place]; 04/26/2015 8:25 AM) Medications Reconciled  Social History [name], CMA; 04/26/2015 8:25 AM) Alcohol use Recently quit alcohol use. No caffeine use Tobacco use Former smoker.  Family History [name], [place]; 04/26/2015 8:25 AM) Alcohol Abuse Father. Arthritis Sister. Bleeding disorder Sister. Breast Cancer Family Members In General, Sister. Cancer Family Members In General. Diabetes Mellitus Family Members In General. Heart Disease Family Members In General. Hypertension Sister. Kidney Disease Mother. Thyroid problems Family Members In General, Sister.  Pregnancy / Birth History [name], [place]; 04/26/2015 8:25 AM) Age at menarche 25 years. Age of menopause 51-55 Contraceptive History Oral contraceptives. Gravida 2 Irregular periods Maternal age 28-20 Para 2     Review of Systems [name] CMA; 04/26/2015 8:25 AM) General Present- Appetite Loss, Chills and Fatigue. Not Present- Fever, Night Sweats, Weight Gain and Weight Loss. Skin Present- Change in Wart/Mole and Dryness. Not Present- Hives, Jaundice, New Lesions, Non-Healing Wounds, Rash and Ulcer. HEENT Present- Hoarseness and Wears glasses/contact lenses. Not Present- Earache, Hearing Loss, Nose Bleed, Oral Ulcers, Ringing in the Ears, Seasonal Allergies, Sinus Pain, Sore Throat, Visual Disturbances and Yellow Eyes. Respiratory Not Present- Bloody sputum, Chronic Cough, Difficulty Breathing, Snoring and Wheezing. Breast Not Present- Breast Mass, Breast Pain, Nipple Discharge and Skin Changes. Cardiovascular Present- Chest Pain, Palpitations, Rapid Heart Rate and Swelling of Extremities. Not Present- Difficulty Breathing Lying Down, Leg Cramps and Shortness of Breath. Gastrointestinal Present- Change in Bowel Habits. Not Present- Abdominal Pain, Bloating, Bloody Stool, Chronic diarrhea, Constipation, Difficulty Swallowing, Excessive gas, Gets full quickly at meals, Hemorrhoids, Indigestion, Nausea, Rectal Pain and Vomiting. Female Genitourinary Not Present- Frequency, Nocturia, Painful Urination, Pelvic Pain and Urgency. Musculoskeletal Present- Back Pain and Swelling of Extremities. Not Present- Joint Pain, Joint Stiffness, Muscle Pain and Muscle Weakness. Neurological Present- Numbness and Tingling. Not Present- Decreased Memory, Fainting, Headaches, Seizures, Tremor, Trouble walking and Weakness. Psychiatric Present- Anxiety and Change in Sleep Pattern. Not Present- Bipolar, Depression, Fearful and Frequent crying. Hematology Present- Easy Bruising. Not Present- Excessive bleeding, Gland problems, HIV  and Persistent Infections.   Physical Exam (Courtney Caldeira A. Samnang Shugars MD; 04/26/2015 1:50 PM)  General Mental Status-Alert. General Appearance-Consistent with stated age. Hydration-Well hydrated. Voice-Normal.  Head and Neck Head-normocephalic, atraumatic with no lesions or palpable masses. Trachea-midline. Thyroid Gland Characteristics - normal size and consistency.  Eye Eyeball - Bilateral-Extraocular movements intact. Sclera/Conjunctiva -  Bilateral-No scleral icterus.  Chest and Lung Exam Chest and lung exam reveals -quiet, even and easy respiratory effort with no use of accessory muscles and on auscultation, normal breath sounds, no adventitious sounds and normal vocal resonance. Inspection Chest Wall - Normal. Back - normal.  Breast Breast - Left-Symmetric, Non Tender, No Biopsy scars, no Dimpling, No Inflammation, No Lumpectomy scars, No Mastectomy scars, No Peau d' Orange. Breast - Right-Symmetric, Non Tender, No Biopsy scars, no Dimpling, No Inflammation, No Lumpectomy scars, No Mastectomy scars, No Peau d' Orange. Breast Lump-No Palpable Breast Mass. Note: BRUISING AT [place]   Cardiovascular Cardiovascular examination reveals -normal heart sounds, regular rate and rhythm with no murmurs and normal pedal pulses bilaterally.  Abdomen Inspection Inspection of the abdomen reveals - No Hernias. Skin - Scar - no surgical scars. Palpation/Percussion Palpation and Percussion of the abdomen reveal - Soft, Non Tender, No Rebound tenderness, No Rigidity (guarding) and No hepatosplenomegaly. Auscultation Auscultation of the abdomen reveals - Bowel sounds normal.  Neurologic Neurologic evaluation reveals -alert and oriented x 3 with no impairment of recent or remote memory. Mental Status-Normal.  Musculoskeletal Normal Exam - Left-Upper Extremity Strength Normal and Lower Extremity Strength Normal. Normal Exam - Right-Upper Extremity Strength  Normal and Lower Extremity Strength Normal.  Lymphatic Head & Neck  General Head & Neck Lymphatics: Bilateral - Description - Normal. Axillary  General Axillary Region: Bilateral - Description - Normal. Tenderness - Non Tender. Femoral & Inguinal  Generalized Femoral & Inguinal Lymphatics: Bilateral - Description - Normal. Tenderness - Non Tender.    Assessment & Plan (Debe Anfinson A. Juda Lajeunesse MD; 04/26/2015 1:53 PM)  BREAST CANCER, STAGE 1, RIGHT (C50.911) Impression: DISCUSSED BREAST CONSERVATION VS MASTECTOMY AND RECONSTRUCTION DISCUSSED SLN MAPPING SHE WOULD LIKE TO PROCEED WITH RIGHT BREAST SEED LOCALIZED LUMPECTOMY AND SLN MAPPING. Risk of lumpectomy include bleeding, infection, seroma, more surgery, use of seed/wire, wound care, cosmetic deformity and the need for other treatments, death , blood clots, death. Pt agrees to proceed. Risk of sentinel lymph node mapping include bleeding, infection, lymphedema, shoulder pain. stiffness, dye allergy. cosmetic deformity , blood clots, death, need for more surgery. Pt agres to proceed.  Atrial Fibrillation Impression: NEEDS CARDIAC CLEARANCE AND ANTICOAGULATION RECS.  High blood pressure

## 2015-05-18 NOTE — Interval H&P Note (Signed)
History and Physical Interval Note:  05/18/2015 7:00 AM  Courtney Dunlap  has presented today for surgery, with the diagnosis of RIght Breast Cancer  The various methods of treatment have been discussed with the patient and family. After consideration of risks, benefits and other options for treatment, the patient has consented to  Procedure(s): RIGHT BREAST RADIOACTIVE SEED PARTIAL MASTECTOMY WITH RIGHT SENTINEL LYMPH NODE MAPPING (Right) as a surgical intervention .  The patient's history has been reviewed, patient examined, no change in status, stable for surgery.  I have reviewed the patient's chart and labs.  Questions were answered to the patient's satisfaction.     [name] A.

## 2015-05-18 NOTE — Discharge Instructions (Signed)
[facility] °Office Phone Number [phone] ° °BREAST BIOPSY/ PARTIAL MASTECTOMY: POST OP INSTRUCTIONS ° °Always review your discharge instruction sheet given to you by the facility where your surgery was performed. ° °IF YOU HAVE DISABILITY OR FAMILY LEAVE FORMS, YOU MUST BRING THEM TO THE OFFICE FOR PROCESSING.  DO NOT GIVE THEM TO YOUR DOCTOR. ° °1. A prescription for pain medication may be given to you upon discharge.  Take your pain medication as prescribed, if needed.  If narcotic pain medicine is not needed, then you may take acetaminophen (Tylenol) or ibuprofen (Advil) as needed. °2. Take your usually prescribed medications unless otherwise directed °3. If you need a refill on your pain medication, please contact your pharmacy.  They will contact our office to request authorization.  Prescriptions will not be filled after 5pm or on week-ends. °4. You should eat very light the first 24 hours after surgery, such as soup, crackers, pudding, etc.  Resume your normal diet the day after surgery. °5. Most patients will experience some swelling and bruising in the breast.  Ice packs and a good support bra will help.  Swelling and bruising can take several days to resolve.  °6. It is common to experience some constipation if taking pain medication after surgery.  Increasing fluid intake and taking a stool softener will usually help or prevent this problem from occurring.  A mild laxative (Milk of Magnesia or Miralax) should be taken according to package directions if there are no bowel movements after 48 hours. °7. Unless discharge instructions indicate otherwise, you may remove your bandages 24-48 hours after surgery, and you may shower at that time.  You may have steri-strips (small skin tapes) in place directly over the incision.  These strips should be left on the skin for 7-10 days.  If your surgeon used skin glue on the incision, you may shower in 24 hours.  The glue will flake off over the  next 2-3 weeks.  Any sutures or staples will be removed at the office during your follow-up visit. °8. ACTIVITIES:  You may resume regular daily activities (gradually increasing) beginning the next day.  Wearing a good support bra or sports bra minimizes pain and swelling.  You may have sexual intercourse when it is comfortable. °a. You may drive when you no longer are taking prescription pain medication, you can comfortably wear a seatbelt, and you can safely maneuver your car and apply brakes. °b. RETURN TO WORK:  ______________________________________________________________________________________ °9. You should see your doctor in the office for a follow-up appointment approximately two weeks after your surgery.  Your doctor’s nurse will typically make your follow-up appointment when she calls you with your pathology report.  Expect your pathology report 2-3 business days after your surgery.  You may call to check if you do not hear from us after three days. °10. OTHER INSTRUCTIONS: _______________________________________________________________________________________________ _____________________________________________________________________________________________________________________________________ °_____________________________________________________________________________________________________________________________________ °_____________________________________________________________________________________________________________________________________ ° °WHEN TO CALL YOUR DOCTOR: °1. Fever over 101.0 °2. Nausea and/or vomiting. °3. Extreme swelling or bruising. °4. Continued bleeding from incision. °5. Increased pain, redness, or drainage from the incision. ° °The clinic staff is available to answer your questions during regular business hours.  Please don’t hesitate to call and ask to speak to one of the nurses for clinical concerns.  If you have a medical emergency, go to the nearest  emergency room or call 911.  A surgeon from [facility] is always on call at the hospital. ° °For further questions, please visit [email or web address]  °

## 2015-05-18 NOTE — Transfer of Care (Signed)
Immediate Anesthesia Transfer of Care Note  Patient: Courtney Dunlap  Procedure(s) Performed: Procedure(s): RIGHT BREAST RADIOACTIVE SEED PARTIAL MASTECTOMY WITH RIGHT SENTINEL LYMPH NODE MAPPING (Right)  Patient Location: PACU  Anesthesia Type:General  Level of Consciousness: awake and patient cooperative  Airway & Oxygen Therapy: Patient Spontanous Breathing and Patient connected to nasal cannula oxygen  Post-op Assessment: Report given to RN, Post -op Vital signs reviewed and stable and Patient moving all extremities  Post vital signs: Reviewed and stable  Last Vitals:  Filed Vitals:   05/18/15 0855  BP:   Pulse:   Temp: 36.4 C  Resp:     Complications: No apparent anesthesia complications

## 2015-05-18 NOTE — Anesthesia Postprocedure Evaluation (Signed)
  Anesthesia Post-op Note  Patient: Courtney Dunlap  Procedure(s) Performed: Procedure(s): RIGHT BREAST RADIOACTIVE SEED PARTIAL MASTECTOMY WITH RIGHT SENTINEL LYMPH NODE MAPPING (Right)  Patient Location: PACU  Anesthesia Type:General and block  Level of Consciousness: awake and alert   Airway and Oxygen Therapy: Patient Spontanous Breathing  Post-op Pain: Controlled  Post-op Assessment: Post-op Vital signs reviewed, Patient's Cardiovascular Status Stable and Respiratory Function Stable  Post-op Vital Signs: Reviewed  Filed Vitals:   05/18/15 1107  BP: 128/58  Pulse: 55  Temp: 36.7 C  Resp:     Complications: No apparent anesthesia complications

## 2015-05-18 NOTE — Anesthesia Procedure Notes (Addendum)
Anesthesia Regional Block:  Pectoralis block  Pre-Anesthetic Checklist: ,, timeout performed, Correct Patient, Correct Site, Correct Laterality, Correct Procedure, Correct Position, site marked, Risks and benefits discussed, pre-op evaluation, post-op pain management  Laterality: Right  Prep: Maximum Sterile Barrier Precautions used and chloraprep       Needles:  Injection technique: Single-shot  Needle Type: Echogenic Stimulator Needle     Needle Length: 9cm 9 cm Needle Gauge: 21 and 21 G    Additional Needles:  Procedures: ultrasound guided (picture in chart) Pectoralis block Narrative:  Start time: 05/18/2015 7:15 AM End time: 05/18/2015 7:24 AM Injection made incrementally with aspirations every 5 mL. Anesthesiologist: Roderic Palau  Additional Notes: 2% Lidocaine skin wheel.   Procedure Name: LMA Insertion Date/Time: 05/18/2015 7:42 AM Performed by: Terrill Mohr Pre-anesthesia Checklist: Emergency Drugs available, Patient identified, Suction available and Patient being monitored Patient Re-evaluated:Patient Re-evaluated prior to inductionOxygen Delivery Method: Circle system utilized Preoxygenation: Pre-oxygenation with 100% oxygen Intubation Type: IV induction Ventilation: Mask ventilation without difficulty LMA: LMA inserted LMA Size: 4.0 Number of attempts: 1 Placement Confirmation: breath sounds checked- equal and bilateral and positive ETCO2 Tube secured with: Tape (taped across cheeks) Dental Injury: Teeth and Oropharynx as per pre-operative assessment

## 2015-05-19 ENCOUNTER — Encounter (HOSPITAL_COMMUNITY): Payer: Self-pay | Admitting: Surgery

## 2015-05-22 ENCOUNTER — Encounter: Payer: Self-pay | Admitting: *Deleted

## 2015-05-22 NOTE — Progress Notes (Signed)
Ordered oncotype per Dr. Magrinat. Faxed PA to [employer] and faxed requisition to pathology and confirmed receipt.  

## 2015-05-31 ENCOUNTER — Encounter (HOSPITAL_COMMUNITY): Payer: Self-pay

## 2015-06-01 ENCOUNTER — Telehealth: Payer: Self-pay | Admitting: *Deleted

## 2015-06-01 ENCOUNTER — Other Ambulatory Visit: Payer: Self-pay | Admitting: Oncology

## 2015-06-01 NOTE — Telephone Encounter (Signed)
Left vm for pt to return call. Oncotype score 11/7%. Referral placed for Dr. Isidore Moos

## 2015-06-01 NOTE — Telephone Encounter (Signed)
Called pt with Oncotype score of 11. Discussed with pt that referral was placed for there to see Dr. Isidore Moos and discuss need of xrt. Denies further questions or needs at this time. Encourage pt to call with concerns. Received verbal understanding.

## 2015-06-09 ENCOUNTER — Ambulatory Visit: Payer: BC Managed Care – PPO | Admitting: Radiation Oncology

## 2015-06-09 ENCOUNTER — Ambulatory Visit: Payer: BC Managed Care – PPO

## 2015-06-16 ENCOUNTER — Ambulatory Visit: Payer: BC Managed Care – PPO

## 2015-06-16 ENCOUNTER — Ambulatory Visit
Admission: RE | Admit: 2015-06-16 | Discharge: 2015-06-16 | Disposition: A | Discharge status: Home / Self Care | Payer: BC Managed Care – PPO | Source: Ambulatory Visit | Attending: Radiation Oncology | Admitting: Radiation Oncology

## 2015-06-16 ENCOUNTER — Ambulatory Visit: Payer: BC Managed Care – PPO | Admitting: Radiation Oncology

## 2015-06-16 VITALS — BP 129/66 | HR 65 | Temp 98.1°F | Ht 62.5 in | Wt 214.1 lb

## 2015-06-16 DIAGNOSIS — Z510 Encounter for antineoplastic radiation therapy: Secondary | ICD-10-CM | POA: Insufficient documentation

## 2015-06-16 DIAGNOSIS — C50411 Malignant neoplasm of upper-outer quadrant of right female breast: Secondary | ICD-10-CM

## 2015-06-16 DIAGNOSIS — Z170 Estrogen receptor positive status [ER+]: Secondary | ICD-10-CM | POA: Diagnosis not present

## 2015-06-16 NOTE — Progress Notes (Addendum)
Location of Breast Cancer: Right Breast Cancer  Histology per Pathology Report:  05/18/15 Diagnosis 1. 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 - DUCTAL CARCINOMA IN SITU, LOW GRADE. - THE SURGICAL RESECTION MARGINS ARE NEGATIVE FOR CARCINOMA. - SEE ONCOLOGY TABLE BELOW. 2. Lymph node, sentinel, biopsy, Right axillary - THERE IS NO EVIDENCE OF CARCINOMA IN 1 OF 1 LYMPH NODE (0/1). 3. Lymph node, sentinel, biopsy, Right axillary - THERE IS NO EVIDENCE OF CARCINOMA IN 1 OF 1 LYMPH NODE (0/1). 4. Lymph node, sentinel, biopsy, Right axillary - THERE IS NO EVIDENCE OF CARCINOMA IN 1 OF 1 LYMPH NODE (0/1). 1 of  Receptor Status: ER(100%), PR (100%), Her2-neu (NEG)  Did patient present with symptoms or was this found on screening mammography?: She had routine screening mammography at [facility] 04/11/2015 showing a new irregular lesion in the right breast.   Past/Anticipated interventions by surgeon, if any: Right breast seed localized partial mastectomy with right axillary sentinel lymph node mapping 05/18/15 by Dr. Brantley Stage  Past/Anticipated interventions by medical oncology, if any: She saw Dr. Jana Hakim 04/26/15. No Chemotherapy recommended. Antiestrogen therapy after radiation.  Lymphedema issues, if any:  She denies, and reports she is doing her exercises.  Pain issues, if any: No  SAFETY ISSUES:  Prior radiation? No  Pacemaker/ICD? No  Possible current pregnancy? No  Is the patient on methotrexate? No  Current Complaints / other details:  None  BP 129/66 mmHg  Pulse 65  Temp(Src) 98.1 F (36.7 C)  Ht 5' 2.5" (1.588 m)  Wt 214 lb 1.6 oz (97.115 kg)  BMI 38.51 kg/m2  LMP 11/21/2010  Wt Readings from Last 3 Encounters:  06/16/15 214 lb 1.6 oz (97.115 kg)  05/16/15 210 lb 1.6 oz (95.301 kg)  05/04/15 211 lb (95.709 kg)      Sabriyah Wilcher, Stephani Police, RN 06/16/2015,9:08 AM

## 2015-06-16 NOTE — Progress Notes (Signed)
  Radiation Oncology         [phone] ________________________________  Name: Courtney Dunlap MRN: [id number]  Date: 06/16/2015  DOB: 1950-09-28  SIMULATION AND TREATMENT PLANNING NOTE    Outpatient  DIAGNOSIS:     ICD-9-CM ICD-10-CM   1. Breast cancer of upper-outer quadrant of right female breast ([place]) 174.4 C50.411     NARRATIVE:  The patient was brought to the [facility].  Identity was confirmed.  All relevant records and images related to the planned course of therapy were reviewed.  The patient freely provided informed written consent to proceed with treatment after reviewing the details related to the planned course of therapy. The consent form was witnessed and verified by the simulation staff.    Then, the patient was set-up in a stable reproducible supine position for radiation therapy with her ipsilateral arm over her head, and her upper body secured in a custom-made Vac-lok device.  CT images were obtained.  Surface markings were placed.  The CT images were loaded into the planning software.    TREATMENT PLANNING NOTE: Treatment planning then occurred.  The radiation prescription was entered and confirmed.     A total of 3 medically necessary complex treatment devices were fabricated and supervised by me: 2 fields with MLCs for custom blocks to protect heart, and lungs;  and, a Vac-lok. MORE COMPLEX DEVICES MAY BE MADE IN DOSIMETRY FOR FIELD IN FIELD BEAMS FOR DOSE HOMOGENEITY.  I have requested : 3D Simulation  I have requested a DVH of the following structures: lungs, heart, lumpectomy cavity.    The patient will receive 50 Gy in 25 fractions to the right breast with 2 tangential fields. This will not be followed by a boost.  Optical Surface Tracking Plan:  Since intensity modulated radiotherapy (IMRT) and 3D conformal radiation treatment methods are predicated on accurate and precise positioning for treatment, intrafraction motion monitoring is medically  necessary to ensure accurate and safe treatment delivery. The ability to quantify intrafraction motion without excessive ionizing radiation dose can only be performed with optical surface tracking. Accordingly, surface imaging offers the opportunity to obtain 3D measurements of patient position throughout IMRT and 3D treatments without excessive radiation exposure. I am ordering optical surface tracking for this patient's upcoming course of radiotherapy.  ________________________________   Reference:  Ursula Alert, J, et al. Surface imaging-based analysis of intrafraction motion for breast radiotherapy patients.Journal of [place], n. 6, nov. 2014. ISSN [id number].  Available at: <http://www.jacmp.org/index.php/jacmp/article/view/4957>.    -----------------------------------  Eppie Gibson, MD

## 2015-06-16 NOTE — Progress Notes (Signed)
Radiation Oncology         [phone] ________________________________  Name: Courtney Dunlap MRN: [id number]  Date: 06/16/2015  DOB: 11-14-1950  Follow-Up Visit Note  Outpatient  CC: Courtney Stack, MD  Erroll Luna, MD  Diagnosis:      ICD-9-CM ICD-10-CM   1. 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     Since consultation, she underwent right lumpectomy and sentinel lymph node biopsy on 05/18/15, by Dr. Brantley Stage. Her margins are negative by at least 42m to invasive and in situ disease. Her 3 sentinel nodes were negative. Pathology notable for that described in the diagnosis. Oncotype DX score was 11, low risk. She will not be receiving chemotherapy. The patient reports tightness in the right axillary region that she attributes to the sentinel lymph node biopsy. She had a little blood drainage from the inferior aspect of her breast scar this week, applied peroxide and it dried up.  She has been released by Dr CBrantley Stagefor RT planning.  ALLERGIES:  is allergic to codeine; adhesive; and latex.  Meds: Current Outpatient Prescriptions  Medication Sig Dispense Refill  . acetaminophen (TYLENOL) 500 MG tablet Take 500 mg by mouth every 6 (six) hours as needed for pain.    .Marland Kitchenatorvastatin (LIPITOR) 20 MG tablet Take 20 mg by mouth daily after breakfast.     . diclofenac sodium (VOLTAREN) 1 % GEL Apply 2 g topically as needed (FOR KNEES).     . hydrochlorothiazide (MICROZIDE) 12.5 MG capsule Take 1 capsule (12.5 mg total) by mouth daily. 90 capsule 3  . HYDROcodone-acetaminophen (NORCO) 5-325 MG tablet Take 1 tablet by mouth every 6 (six) hours as needed for moderate pain. (Patient not taking: Reported on 06/16/2015) 30 tablet 0  . metoprolol succinate (TOPROL-XL) 25 MG 24 hr  tablet TAKE 1/2 TABLET BY MOUTH DAILY (Patient taking differently: TAKE 1/2 TABLET BY MOUTH DAILY   after breakfast) 15 tablet 1  . Omega-3 Fatty Acids (FISH OIL) 1000 MG CAPS Take 1,000 mg by mouth daily.    . ranitidine (ZANTAC) 150 MG tablet Take 150 mg by mouth 2 (two) times daily as needed for heartburn.    . sotalol (BETAPACE) 80 MG tablet Take 0.5 tablets (40 mg total) by mouth 2 (two) times daily. 90 tablet 3  . traMADol (ULTRAM) 50 MG tablet Take 1 tablet (50 mg total) by mouth every 6 (six) hours as needed for moderate pain. 30 tablet 0  . vitamin B-12 (CYANOCOBALAMIN) 1000 MCG tablet Take 1,000 mcg by mouth daily.    . Vitamin D, Ergocalciferol, (DRISDOL) 50000 UNITS CAPS capsule Take 50,000 Units by mouth every 7 (seven) days. On Saturday    . warfarin (COUMADIN) 5 MG tablet Take 7.5 mg by mouth daily.      No current facility-administered medications for this encounter.    Physical Findings: Vitals with BMI 06/16/2015  Height 5' 2.5"  Weight 214 lbs 2 oz  BMI 338.7 Systolic 1564 Diastolic 66  Pulse 65  Respirations    General: Alert and oriented, in no acute distress HEENT: Head is normocephalic. Extraocular movements are intact. Oropharynx is clear. Neck: Neck is supple, no palpable cervical or supraclavicular lymphadenopathy. Heart: Regular in rate and rhythm with no murmurs, rubs, or gallops. Chest: Clear  to auscultation bilaterally, with no rhonchi, wheezes, or rales. Abdomen: Soft, nontender, nondistended, with no rigidity or guarding. Extremities: No cyanosis or edema. Lymphatics: see Neck Exam Musculoskeletal: good arm ROM Neurologic: No obvious focalities. Speech is fluent.  Psychiatric: Judgment and insight are intact. Affect is appropriate. Breast exam reveals that the lateral right breast has a vertical scar with some scabbing in the inferior aspect of the scar.  Lab Findings: Lab Results  Component Value Date   WBC 7.9 05/16/2015   HGB 13.8 05/16/2015    HCT 41.9 05/16/2015   MCV 93.3 05/16/2015   PLT 219 05/16/2015   Radiographic Findings: Nm Sentinel Node Inj-no Rpt (breast)  05/18/2015  CLINICAL DATA: RIGHT BREAST CANCER Sulfur colloid was injected intradermally by the nuclear medicine technologist for breast cancer sentinel node localization.    Impression/Plan: Right Breast Cancer. We discussed adjuvant radiotherapy today.  I recommend treating her right breast in order to reduce locoregional recurrence by two-thirds. I anticipate approximately 5 weeks of radiotherapy. The risks, benefits and side effects of this treatment were discussed in detail.  She understands that radiotherapy is associated with skin irritation and fatigue in the acute setting. Late effects can include cosmetic changes and rare injury to internal organs.   She is enthusiastic about proceeding with treatment.  A consent form has been signed and placed in her chart. Anticipate starting RT in about 10 days.    This document serves as a record of services personally performed by Eppie Gibson, MD. It was created on her behalf by Darcus Austin, a trained medical scribe. The creation of this record is based on the scribe's personal observations and the provider's statements to them. This document has been checked and approved by the attending provider.     _____________________________________   Eppie Gibson, MD

## 2015-06-19 ENCOUNTER — Ambulatory Visit: Payer: BC Managed Care – PPO

## 2015-06-20 ENCOUNTER — Ambulatory Visit: Payer: BC Managed Care – PPO

## 2015-06-21 ENCOUNTER — Ambulatory Visit: Payer: BC Managed Care – PPO

## 2015-06-21 DIAGNOSIS — Z510 Encounter for antineoplastic radiation therapy: Secondary | ICD-10-CM | POA: Diagnosis not present

## 2015-06-22 ENCOUNTER — Encounter: Payer: Self-pay | Admitting: *Deleted

## 2015-06-22 ENCOUNTER — Ambulatory Visit: Payer: BC Managed Care – PPO

## 2015-06-22 NOTE — Progress Notes (Signed)
[place] Psychosocial Distress Screening Clinical Social Work  Clinical Social Work was referred by distress screening protocol.  The patient scored a 5 on the Psychosocial Distress Thermometer which indicates moderate distress. Clinical Social Worker phoned pt to assess for distress and other psychosocial needs. CSW introduced self, explained role of CSW and how could assist. Pt reports concerns with finances as her biggest stressor. CSW reviewed options for assistance and how to apply. CSW explained grant funds and gas cards while doing radiation. CSW made referral to [place], [facility] for further assistance. Pt reports good support from family and she plans to continue working through treatment. Pt also interested in attending Breast Cancer Support group.  CSW to meet with pt on 11/18 to work on additional applications for assistance.   ONCBCN DISTRESS SCREENING 06/16/2015  Screening Type Initial Screening  Distress experienced in past week (1-10) 5  Practical problem type Work/school  Emotional problem type Depression;Nervousness/Anxiety;Adjusting to illness  Information Concerns Type   Physical Problem type Tingling hands/feet;Skin dry/itchy;Swollen arms/legs  Physician notified of physical symptoms Yes  Referral to support programs   Other     Clinical Social Worker follow up needed: Yes.    If yes, follow up plan: See above Loren Racer, [place] Worker [place]  [facility] Phone: [phone] Fax: [phone]

## 2015-06-23 ENCOUNTER — Ambulatory Visit
Admission: RE | Admit: 2015-06-23 | Discharge: 2015-06-23 | Disposition: A | Discharge status: Home / Self Care | Payer: BC Managed Care – PPO | Source: Ambulatory Visit | Attending: Radiation Oncology | Admitting: Radiation Oncology

## 2015-06-23 DIAGNOSIS — Z510 Encounter for antineoplastic radiation therapy: Secondary | ICD-10-CM | POA: Diagnosis not present

## 2015-06-25 ENCOUNTER — Ambulatory Visit: Payer: BC Managed Care – PPO

## 2015-06-26 ENCOUNTER — Ambulatory Visit
Admission: RE | Admit: 2015-06-26 | Discharge: 2015-06-26 | Disposition: A | Discharge status: Home / Self Care | Payer: BC Managed Care – PPO | Source: Ambulatory Visit | Attending: Radiation Oncology | Admitting: Radiation Oncology

## 2015-06-26 ENCOUNTER — Encounter: Payer: Self-pay | Admitting: Radiation Oncology

## 2015-06-26 VITALS — BP 147/82 | HR 70 | Temp 98.2°F | Ht 62.5 in | Wt 216.7 lb

## 2015-06-26 DIAGNOSIS — Z510 Encounter for antineoplastic radiation therapy: Secondary | ICD-10-CM | POA: Diagnosis not present

## 2015-06-26 DIAGNOSIS — C50411 Malignant neoplasm of upper-outer quadrant of right female breast: Secondary | ICD-10-CM | POA: Diagnosis not present

## 2015-06-26 MED ORDER — ALRA NON-METALLIC DEODORANT (RAD-ONC)
1.0000 "application " | Freq: Once | TOPICAL | Status: AC
  Administered 2015-06-26: 1 via TOPICAL

## 2015-06-26 MED ORDER — RADIAPLEXRX EX GEL
Freq: Once | CUTANEOUS | Status: AC
Start: 2015-06-26 — End: 2015-06-26
  Administered 2015-06-26: 17:00:00 via TOPICAL

## 2015-06-26 NOTE — Progress Notes (Signed)
   Weekly Management Note:  Outpatient    ICD-9-CM ICD-10-CM   1. Breast cancer of upper-outer quadrant of right female breast ([facility]) 174.4 C50.411 hyaluronate sodium (RADIAPLEXRX) gel     non-metallic deodorant (ALRA) 1 application    Current Dose:  2 Gy  Projected Dose: 50 Gy   Narrative:  The patient presents for routine under treatment assessment.  CBCT/MVCT images/Port film x-rays were reviewed.  The chart was checked. No new complaints  Physical Findings:  height is 5' 2.5" (1.588 m) and weight is 216 lb 11.2 oz (98.294 kg). Her temperature is 98.2 F (36.8 C). Her blood pressure is 147/82 and her pulse is 70.   Wt Readings from Last 3 Encounters:  06/26/15 216 lb 11.2 oz (98.294 kg)  06/16/15 214 lb 1.6 oz (97.115 kg)  05/16/15 210 lb 1.6 oz (95.301 kg)   Skin over R breast without changes  Impression:  The patient is tolerating radiotherapy.  Plan:  Continue radiotherapy as planned.    ________________________________   Eppie Gibson, M.D.

## 2015-06-26 NOTE — Progress Notes (Signed)
Ms. [place] is here for her 1st radiation of treatment to her Right Breast. She denies any fatigue at this time. Her skin is intact without any abnormalities. She admits to an occasional twinge in her breast with comes and goes quickly. She has no other complaints at this time.   BP 147/82 mmHg  Pulse 70  Temp(Src) 98.2 F (36.8 C)  Ht 5' 2.5" (1.588 m)  Wt 216 lb 11.2 oz (98.294 kg)  BMI 38.98 kg/m2  LMP 11/21/2010

## 2015-06-26 NOTE — Progress Notes (Signed)
Pt here for patient teaching.  Pt given Radiation and You booklet, skin care instructions, Alra deodorant and Radiaplex gel. Pt reports they have not watched the Radiation Therapy Education video and was given the link  Reviewed areas of pertinence such as fatigue, skin changes, breast tenderness, breast swelling, cough, shortness of breath, earaches and taste changes . Pt able to give teach back of to pat skin, use unscented/gentle soap and drink plenty of water,apply Radiaplex bid, avoid applying anything to skin within 4 hours of treatment, avoid wearing an under wire bra and to use an electric razor if they must shave. Pt verbalizes understanding of information given and will contact nursing with any questions or concerns.

## 2015-06-27 ENCOUNTER — Ambulatory Visit
Admission: RE | Admit: 2015-06-27 | Discharge: 2015-06-27 | Disposition: A | Discharge status: Home / Self Care | Payer: BC Managed Care – PPO | Source: Ambulatory Visit | Attending: Radiation Oncology | Admitting: Radiation Oncology

## 2015-06-27 DIAGNOSIS — Z510 Encounter for antineoplastic radiation therapy: Secondary | ICD-10-CM | POA: Diagnosis not present

## 2015-06-28 ENCOUNTER — Ambulatory Visit
Admission: RE | Admit: 2015-06-28 | Discharge: 2015-06-28 | Disposition: A | Discharge status: Home / Self Care | Payer: BC Managed Care – PPO | Source: Ambulatory Visit | Attending: Radiation Oncology | Admitting: Radiation Oncology

## 2015-06-28 DIAGNOSIS — Z510 Encounter for antineoplastic radiation therapy: Secondary | ICD-10-CM | POA: Diagnosis not present

## 2015-06-29 ENCOUNTER — Ambulatory Visit
Admission: RE | Admit: 2015-06-29 | Discharge: 2015-06-29 | Disposition: A | Discharge status: Home / Self Care | Payer: BC Managed Care – PPO | Source: Ambulatory Visit | Attending: Radiation Oncology | Admitting: Radiation Oncology

## 2015-06-29 DIAGNOSIS — Z510 Encounter for antineoplastic radiation therapy: Secondary | ICD-10-CM | POA: Diagnosis not present

## 2015-06-30 ENCOUNTER — Ambulatory Visit
Admission: RE | Admit: 2015-06-30 | Discharge: 2015-06-30 | Disposition: A | Discharge status: Home / Self Care | Payer: BC Managed Care – PPO | Source: Ambulatory Visit | Attending: Radiation Oncology | Admitting: Radiation Oncology

## 2015-06-30 DIAGNOSIS — Z510 Encounter for antineoplastic radiation therapy: Secondary | ICD-10-CM | POA: Diagnosis not present

## 2015-07-02 ENCOUNTER — Ambulatory Visit
Admission: RE | Admit: 2015-07-02 | Discharge: 2015-07-02 | Disposition: A | Discharge status: Home / Self Care | Payer: BC Managed Care – PPO | Source: Ambulatory Visit | Attending: Radiation Oncology | Admitting: Radiation Oncology

## 2015-07-02 ENCOUNTER — Ambulatory Visit: Payer: BC Managed Care – PPO

## 2015-07-02 DIAGNOSIS — Z510 Encounter for antineoplastic radiation therapy: Secondary | ICD-10-CM | POA: Diagnosis not present

## 2015-07-03 ENCOUNTER — Encounter: Payer: Self-pay | Admitting: Radiation Oncology

## 2015-07-03 ENCOUNTER — Ambulatory Visit
Admission: RE | Admit: 2015-07-03 | Discharge: 2015-07-03 | Disposition: A | Discharge status: Home / Self Care | Payer: BC Managed Care – PPO | Source: Ambulatory Visit | Attending: Radiation Oncology | Admitting: Radiation Oncology

## 2015-07-03 ENCOUNTER — Telehealth: Payer: Self-pay | Admitting: *Deleted

## 2015-07-03 VITALS — BP 116/53 | HR 62 | Temp 98.1°F | Ht 62.5 in | Wt 216.2 lb

## 2015-07-03 DIAGNOSIS — C50411 Malignant neoplasm of upper-outer quadrant of right female breast: Secondary | ICD-10-CM

## 2015-07-03 DIAGNOSIS — Z510 Encounter for antineoplastic radiation therapy: Secondary | ICD-10-CM | POA: Diagnosis not present

## 2015-07-03 NOTE — Telephone Encounter (Signed)
Spoke with patient after starting radiation therapy. She is doing well except for a little area around her surgical incision that is bothering her and that she states is oozing a little.  Instructed her she should call Dr. Josetta Huddle office and that she may need have it evaluated.  Patient verbalized understanding.

## 2015-07-03 NOTE — Progress Notes (Signed)
Courtney Dunlap is here for her 7th fraction of radiation to her Right Breast. She admits to some fatigue at times especially after working during the day. She is sleeping well at night. Her skin is slightly reddened to her anterior breast. Her incision is red, and at the end of her incision to her right it is slightly redder and she reports it is tender at times. She is using peroxide and neosporin to this area 3 times a day.   BP 116/53 mmHg  Pulse 62  Temp(Src) 98.1 F (36.7 C)  Ht 5' 2.5" (1.588 m)  Wt 216 lb 3.2 oz (98.068 kg)  BMI 38.89 kg/m2  LMP 11/21/2010

## 2015-07-03 NOTE — Progress Notes (Signed)
   Weekly Management Note:  Outpatient    ICD-9-CM ICD-10-CM   1. Breast cancer of upper-outer quadrant of right female breast (HCC) 174.4 C50.411     Current Dose:  14 Gy  Projected Dose: 50 Gy   Narrative:  The patient presents for routine under treatment assessment.  CBCT/MVCT images/Port film x-rays were reviewed.  The chart was checked.  There is a little area around her surgical incision that is more erythematous. She is applying neosporin and peroxide there (which causes "fizzing"). No fever   Physical Findings:  height is 5' 2.5" (1.588 m) and weight is 216 lb 3.2 oz (98.068 kg). Her temperature is 98.1 F (36.7 C). Her blood pressure is 116/53 and her pulse is 62.   Wt Readings from Last 3 Encounters:  07/03/15 216 lb 3.2 oz (98.068 kg)  06/26/15 216 lb 11.2 oz (98.294 kg)  06/16/15 214 lb 1.6 oz (97.115 kg)   Skin over R breast without changes except slightly more erythema at inferior/lateral aspect of lumpectomy scar. No induration or drainage.  Impression:  The patient is tolerating radiotherapy.  Plan:  Continue radiotherapy as planned.   Instructed her she should call Dr. Josetta Huddle office and that she may need have the site on her skin evaluated for possible deeper infection.  Patient verbalized understanding.   ________________________________   Eppie Gibson, M.D.

## 2015-07-04 ENCOUNTER — Ambulatory Visit
Admission: RE | Admit: 2015-07-04 | Discharge: 2015-07-04 | Disposition: A | Discharge status: Home / Self Care | Payer: BC Managed Care – PPO | Source: Ambulatory Visit | Attending: Radiation Oncology | Admitting: Radiation Oncology

## 2015-07-04 ENCOUNTER — Other Ambulatory Visit (HOSPITAL_BASED_OUTPATIENT_CLINIC_OR_DEPARTMENT_OTHER): Payer: BC Managed Care – PPO

## 2015-07-04 DIAGNOSIS — Z510 Encounter for antineoplastic radiation therapy: Secondary | ICD-10-CM | POA: Diagnosis not present

## 2015-07-04 DIAGNOSIS — C50411 Malignant neoplasm of upper-outer quadrant of right female breast: Secondary | ICD-10-CM | POA: Diagnosis not present

## 2015-07-04 LAB — COMPREHENSIVE METABOLIC PANEL (CC13)
ALK PHOS: 130 U/L (ref 40–150)
ALT: 22 U/L (ref 0–55)
ANION GAP: 9 meq/L (ref 3–11)
AST: 22 U/L (ref 5–34)
Albumin: 3.6 g/dL (ref 3.5–5.0)
BILIRUBIN TOTAL: 0.72 mg/dL (ref 0.20–1.20)
BUN: 16 mg/dL (ref 7.0–26.0)
CALCIUM: 9.3 mg/dL (ref 8.4–10.4)
CO2: 28 meq/L (ref 22–29)
CREATININE: 0.8 mg/dL (ref 0.6–1.1)
Chloride: 104 mEq/L (ref 98–109)
EGFR: 79 mL/min/{1.73_m2} — AB (ref >90)
Glucose: 86 mg/dl (ref 70–140)
Potassium: 3.9 mEq/L (ref 3.5–5.1)
Sodium: 140 mEq/L (ref 136–145)
TOTAL PROTEIN: 7 g/dL (ref 6.4–8.3)

## 2015-07-04 LAB — CBC WITH DIFFERENTIAL/PLATELET
BASO%: 0.4 % (ref 0.0–2.0)
Basophils Absolute: 0 10*3/uL (ref 0.0–0.1)
EOS ABS: 0.2 10*3/uL (ref 0.0–0.5)
EOS%: 3.2 % (ref 0.0–7.0)
HEMATOCRIT: 39.9 % (ref 34.8–46.6)
HGB: 13.3 g/dL (ref 11.6–15.9)
LYMPH#: 1.3 10*3/uL (ref 0.9–3.3)
LYMPH%: 19.8 % (ref 14.0–49.7)
MCH: 31.1 pg (ref 25.1–34.0)
MCHC: 33.3 g/dL (ref 31.5–36.0)
MCV: 93.2 fL (ref 79.5–101.0)
MONO#: 0.8 10*3/uL (ref 0.1–0.9)
MONO%: 12.2 % (ref 0.0–14.0)
NEUT%: 64.4 % (ref 38.4–76.8)
NEUTROS ABS: 4.4 10*3/uL (ref 1.5–6.5)
PLATELETS: 210 10*3/uL (ref 145–400)
RBC: 4.28 10*6/uL (ref 3.70–5.45)
RDW: 13.2 % (ref 11.2–14.5)
WBC: 6.8 10*3/uL (ref 3.9–10.3)

## 2015-07-05 ENCOUNTER — Ambulatory Visit
Admission: RE | Admit: 2015-07-05 | Discharge: 2015-07-05 | Disposition: A | Discharge status: Home / Self Care | Payer: BC Managed Care – PPO | Source: Ambulatory Visit | Attending: Radiation Oncology | Admitting: Radiation Oncology

## 2015-07-05 ENCOUNTER — Ambulatory Visit: Payer: BC Managed Care – PPO

## 2015-07-05 DIAGNOSIS — Z510 Encounter for antineoplastic radiation therapy: Secondary | ICD-10-CM | POA: Diagnosis not present

## 2015-07-05 LAB — VITAMIN D 25 HYDROXY (VIT D DEFICIENCY, FRACTURES): Vit D, 25-Hydroxy: 40 ng/mL (ref 30–100)

## 2015-07-09 ENCOUNTER — Ambulatory Visit: Payer: BC Managed Care – PPO

## 2015-07-10 ENCOUNTER — Ambulatory Visit
Admission: RE | Admit: 2015-07-10 | Discharge: 2015-07-10 | Disposition: A | Discharge status: Home / Self Care | Payer: BC Managed Care – PPO | Source: Ambulatory Visit | Attending: Radiation Oncology | Admitting: Radiation Oncology

## 2015-07-10 ENCOUNTER — Encounter: Payer: Self-pay | Admitting: Radiation Oncology

## 2015-07-10 VITALS — BP 136/70 | HR 74 | Temp 98.2°F | Ht 62.5 in | Wt 216.9 lb

## 2015-07-10 DIAGNOSIS — C50411 Malignant neoplasm of upper-outer quadrant of right female breast: Secondary | ICD-10-CM

## 2015-07-10 DIAGNOSIS — Z510 Encounter for antineoplastic radiation therapy: Secondary | ICD-10-CM | POA: Diagnosis not present

## 2015-07-10 NOTE — Progress Notes (Signed)
   Weekly Management Note:  Outpatient    ICD-9-CM ICD-10-CM   1. Breast cancer of upper-outer quadrant of right female breast (HCC) 174.4 C50.411     Current Dose:  20 Gy  Projected Dose: 50 Gy   Narrative:  The patient presents for routine under treatment assessment.  CBCT/MVCT images/Port film x-rays were reviewed.  The chart was checked.  She stopped using hydrogen peroxide on scar, and the crusting has gone away   Physical Findings:  height is 5' 2.5" (1.588 m) and weight is 216 lb 14.4 oz (98.385 kg). Her temperature is 98.2 F (36.8 C). Her blood pressure is 136/70 and her pulse is 74.   Wt Readings from Last 3 Encounters:  07/10/15 216 lb 14.4 oz (98.385 kg)  07/03/15 216 lb 3.2 oz (98.068 kg)  06/26/15 216 lb 11.2 oz (98.294 kg)   Crusting at scar has gone away.  Skin of right breast intact,slightly erythematous  Impression:  The patient is tolerating radiotherapy.  Plan:  Continue radiotherapy as planned.    ________________________________   Eppie Gibson, M.D.

## 2015-07-10 NOTE — Progress Notes (Signed)
Courtney Dunlap is here for her 10th fraction of radiation to her Right Breast. She reports fatigue, and is very tired at the end of the day. Her Right Breast is slightly red, she denies tenderness except at the incision site, which she reports is mild. Her breast is firm and slightly swollen, but she denies any complaints regarding this. She has no other complaints at this time.   BP 136/70 mmHg  Pulse 74  Temp(Src) 98.2 F (36.8 C)  Ht 5' 2.5" (1.588 m)  Wt 216 lb 14.4 oz (98.385 kg)  BMI 39.01 kg/m2  LMP 11/21/2010

## 2015-07-11 ENCOUNTER — Other Ambulatory Visit: Payer: Self-pay

## 2015-07-11 ENCOUNTER — Ambulatory Visit
Admission: RE | Admit: 2015-07-11 | Discharge: 2015-07-11 | Disposition: A | Discharge status: Home / Self Care | Payer: BC Managed Care – PPO | Source: Ambulatory Visit | Attending: Radiation Oncology | Admitting: Radiation Oncology

## 2015-07-11 ENCOUNTER — Emergency Department (HOSPITAL_COMMUNITY): Payer: BC Managed Care – PPO

## 2015-07-11 ENCOUNTER — Encounter: Payer: Self-pay | Admitting: Internal Medicine

## 2015-07-11 ENCOUNTER — Emergency Department (HOSPITAL_COMMUNITY)
Admission: EM | Admit: 2015-07-11 | Discharge: 2015-07-11 | Disposition: A | Discharge status: Home / Self Care | Payer: BC Managed Care – PPO | Attending: Emergency Medicine | Admitting: Emergency Medicine

## 2015-07-11 ENCOUNTER — Ambulatory Visit (HOSPITAL_BASED_OUTPATIENT_CLINIC_OR_DEPARTMENT_OTHER): Payer: BC Managed Care – PPO | Admitting: Oncology

## 2015-07-11 ENCOUNTER — Encounter (HOSPITAL_COMMUNITY): Payer: Self-pay | Admitting: Emergency Medicine

## 2015-07-11 ENCOUNTER — Telehealth: Payer: Self-pay | Admitting: Oncology

## 2015-07-11 VITALS — BP 128/60 | HR 75 | Temp 98.2°F | Resp 18 | Ht 62.5 in | Wt 217.2 lb

## 2015-07-11 DIAGNOSIS — Z86011 Personal history of benign neoplasm of the brain: Secondary | ICD-10-CM | POA: Diagnosis not present

## 2015-07-11 DIAGNOSIS — I252 Old myocardial infarction: Secondary | ICD-10-CM | POA: Diagnosis not present

## 2015-07-11 DIAGNOSIS — Z170 Estrogen receptor positive status [ER+]: Secondary | ICD-10-CM | POA: Diagnosis not present

## 2015-07-11 DIAGNOSIS — F329 Major depressive disorder, single episode, unspecified: Secondary | ICD-10-CM | POA: Diagnosis not present

## 2015-07-11 DIAGNOSIS — C50811 Malignant neoplasm of overlapping sites of right female breast: Secondary | ICD-10-CM | POA: Diagnosis not present

## 2015-07-11 DIAGNOSIS — K219 Gastro-esophageal reflux disease without esophagitis: Secondary | ICD-10-CM | POA: Diagnosis not present

## 2015-07-11 DIAGNOSIS — E663 Overweight: Secondary | ICD-10-CM | POA: Insufficient documentation

## 2015-07-11 DIAGNOSIS — D249 Benign neoplasm of unspecified breast: Secondary | ICD-10-CM | POA: Diagnosis not present

## 2015-07-11 DIAGNOSIS — E669 Obesity, unspecified: Secondary | ICD-10-CM | POA: Diagnosis not present

## 2015-07-11 DIAGNOSIS — Z85828 Personal history of other malignant neoplasm of skin: Secondary | ICD-10-CM | POA: Insufficient documentation

## 2015-07-11 DIAGNOSIS — M1990 Unspecified osteoarthritis, unspecified site: Secondary | ICD-10-CM | POA: Diagnosis not present

## 2015-07-11 DIAGNOSIS — Z87891 Personal history of nicotine dependence: Secondary | ICD-10-CM | POA: Insufficient documentation

## 2015-07-11 DIAGNOSIS — I10 Essential (primary) hypertension: Secondary | ICD-10-CM | POA: Diagnosis not present

## 2015-07-11 DIAGNOSIS — Z510 Encounter for antineoplastic radiation therapy: Secondary | ICD-10-CM | POA: Diagnosis not present

## 2015-07-11 DIAGNOSIS — I471 Supraventricular tachycardia: Secondary | ICD-10-CM | POA: Diagnosis not present

## 2015-07-11 DIAGNOSIS — Z91040 Latex allergy status: Secondary | ICD-10-CM | POA: Insufficient documentation

## 2015-07-11 DIAGNOSIS — F419 Anxiety disorder, unspecified: Secondary | ICD-10-CM | POA: Insufficient documentation

## 2015-07-11 DIAGNOSIS — E7800 Pure hypercholesterolemia, unspecified: Secondary | ICD-10-CM | POA: Insufficient documentation

## 2015-07-11 DIAGNOSIS — Z1379 Encounter for other screening for genetic and chromosomal anomalies: Secondary | ICD-10-CM

## 2015-07-11 DIAGNOSIS — Z7901 Long term (current) use of anticoagulants: Secondary | ICD-10-CM | POA: Diagnosis not present

## 2015-07-11 DIAGNOSIS — I4891 Unspecified atrial fibrillation: Secondary | ICD-10-CM | POA: Insufficient documentation

## 2015-07-11 DIAGNOSIS — I200 Unstable angina: Secondary | ICD-10-CM | POA: Insufficient documentation

## 2015-07-11 DIAGNOSIS — G4739 Other sleep apnea: Secondary | ICD-10-CM | POA: Insufficient documentation

## 2015-07-11 DIAGNOSIS — Z79899 Other long term (current) drug therapy: Secondary | ICD-10-CM | POA: Diagnosis not present

## 2015-07-11 DIAGNOSIS — C50411 Malignant neoplasm of upper-outer quadrant of right female breast: Secondary | ICD-10-CM

## 2015-07-11 DIAGNOSIS — Z8619 Personal history of other infectious and parasitic diseases: Secondary | ICD-10-CM | POA: Diagnosis not present

## 2015-07-11 LAB — BASIC METABOLIC PANEL
Anion gap: 8 (ref 5–15)
BUN: 17 mg/dL (ref 6–20)
CALCIUM: 8.9 mg/dL (ref 8.9–10.3)
CHLORIDE: 103 mmol/L (ref 101–111)
CO2: 26 mmol/L (ref 22–32)
CREATININE: 0.96 mg/dL (ref 0.44–1.00)
GFR calc non Af Amer: >60 mL/min (ref >60)
Glucose, Bld: 143 mg/dL — ABNORMAL HIGH (ref 65–99)
Potassium: 3.6 mmol/L (ref 3.5–5.1)
Sodium: 137 mmol/L (ref 135–145)

## 2015-07-11 LAB — CBC
HCT: 40.1 % (ref 36.0–46.0)
Hemoglobin: 13.1 g/dL (ref 12.0–15.0)
MCH: 30.5 pg (ref 26.0–34.0)
MCHC: 32.7 g/dL (ref 30.0–36.0)
MCV: 93.3 fL (ref 78.0–100.0)
PLATELETS: 202 10*3/uL (ref 150–400)
RBC: 4.3 MIL/uL (ref 3.87–5.11)
RDW: 13.4 % (ref 11.5–15.5)
WBC: 7.8 10*3/uL (ref 4.0–10.5)

## 2015-07-11 LAB — I-STAT TROPONIN, ED: TROPONIN I, POC: 0.02 ng/mL (ref 0.00–0.08)

## 2015-07-11 MED ORDER — METOPROLOL TARTRATE 25 MG PO TABS: 25.0000 mg | ORAL_TABLET | Freq: Three times a day (TID) | ORAL | Status: DC | PRN

## 2015-07-11 MED ORDER — ANASTROZOLE 1 MG PO TABS: 1.0000 mg | ORAL_TABLET | Freq: Every day | ORAL | Status: DC

## 2015-07-11 MED ORDER — METOPROLOL SUCCINATE ER 25 MG PO TB24: 25.0000 mg | ORAL_TABLET | Freq: Every day | ORAL | Status: DC

## 2015-07-11 NOTE — ED Notes (Signed)
Pt heart rate spiked to 175, informed provider, bedside.  Elevated heart rate only lasted 130 seconds then returned to 78.

## 2015-07-11 NOTE — Discharge Instructions (Signed)
Your heart rate was controlled, with the medication given by EMS. We are increasing your daily, long-acting Toprol (XL) to 25 mg in the morning. Also, we are giving you a prescription for short acting Toprol, 25 mg, to use if needed for palpitations, which cause symptoms. It would be best to reserve this for heart rates greater than 140/minute. Return here if needed, for problems. Follow-up with Dr. Radford Pax, and one or 2 weeks.  Paroxysmal Supraventricular Tachycardia Paroxysmal supraventricular tachycardia (PSVT) is a type of abnormal heart rhythm. It causes your heart to beat very quickly and then suddenly stop beating so quickly. A normal heart rate is 60-100 beats per minute. During an episode of PSVT, your heart rate may be 150-250 beats per minute. This can make you feel light-headed and short of breath. An episode of PSVT can be frightening. It is usually not dangerous. The heart has four chambers. All chambers need to work together for the heart to beat effectively. A normal heartbeat usually starts in the right upper chamber of the heart (atrium) when an area (sinoatrial node) puts out an electrical signal that spreads to the other chambers. People with PSVT may have abnormal electrical pathways, or they may have other areas in the upper chambers that send out electrical signals. The result is a very rapid heartbeat. When your heart beats very quickly, it does not have time to fill completely with blood. When PSVT happens often or it lasts for long periods, it can lead to heart weakness and failure. Most people with PSVT do not have any other heart disease. CAUSES Abnormal electrical activity in the heart causes PSVT. It is not known why some people get PSVT and others do not. RISK FACTORS You may be more likely to have PSVT if:  You are 22-78 years old.  You are a woman. Other factors that may increase your chances of an attack include:  Stress.  Being tired.  Smoking.  Stimulant  drugs.  Alcoholic drinks.  Caffeine.  Pregnancy. SIGNS AND SYMPTOMS A mild episode of PSVT may cause no symptoms. If you do have signs and symptoms, they may include:  A pounding heart.  Feeling of skipped heartbeats (palpitations).  Weakness.  Shortness of breath.  Tightness or pain in your chest.  Light-headedness.  Anxiety.  Dizziness.  Sweating.  Nausea.  A fainting spell. DIAGNOSIS Your health care provider may suspect PSVT if you have symptoms that come and go. The health care provider will do a physical exam. If you are having an episode during the exam, the health care provider may be able to diagnose PSVT by listening to your heart and feeling your pulse. Tests may also be done, including:  An electrical study of your heart (electrocardiogram, or ECG).  A test in which you wear a portable ECG monitor all day (Holter monitor) or for several days (event monitor).  A test that involves taking an image of your heart using sound waves (echocardiogram) to rule out other causes of a fast heart rate. TREATMENT You may not need treatment if episodes of PSVT do not happen often or if they do not cause symptoms. If PSVT episodes do cause symptoms, your health care provider may first suggest trying a self-treatment called vagus nerve stimulation. The vagus nerve extends down from the brain. It regulates certain body functions. Stimulating this nerve can slow down the heart. Your health care provider can teach you ways to do this. You may need to try a few ways  to find what works best for you. Options include:  Holding your breath and pushing, as though you are having a bowel movement.  Massaging an area on one side of your neck below your jaw.  Bending forward with your head between your legs.  Bending forward with your head between your legs and coughing.  Massaging your eyeballs with your eyes closed. If vagus nerve stimulation does not work, other treatment options  include:  Medicines to prevent an attack.  Being treated in the hospital with medicine or electric shock to stop an attack (cardioversion). This treatment can include:  Getting medicine through an IV line.  Having a small electric shock delivered to your heart. You will be given medicine to make you sleep through this procedure.  If you have frequent episodes with symptoms, you may need a procedure to get rid of the faulty areas of your heart (radiofrequency ablation) and end the episodes of PSVT. In this procedure:  A long, thin tube (catheter) is passed through one of your veins into your heart.  Energy directed through the catheter eliminates the areas of your heart that are causing abnormal electric stimulation. HOME CARE INSTRUCTIONS  Take medicines only as directed by your health care provider.  Do not use caffeine in any form if caffeine triggers episodes of PSVT. Otherwise, consume caffeine in moderation. This means no more than a few cups of coffee or the equivalent each day.  Do not drink alcohol if alcohol triggers episodes of PSVT. Otherwise, limit alcohol intake to no more than 1 drink per day for nonpregnant women and 2 drinks per day for men. One drink equals 12 ounces of beer, 5 ounces of wine, or 1 ounces of hard liquor.  Do not use any tobacco products, including cigarettes, chewing tobacco, or electronic cigarettes. If you need help quitting, ask your health care provider.  Try to get at least 7 hours of sleep each night.  Find healthy ways to manage stress.  Perform vagus nerve stimulation as directed by your health care provider.  Maintain a healthy weight.  Get some exercise on most days. Ask your health care provider to suggest some good activities for you. SEEK MEDICAL CARE IF:  You are having episodes of PSVT more often, or they are lasting longer.  Vagus nerve stimulation is no longer helping.  You have new symptoms during an episode. SEEK IMMEDIATE  MEDICAL CARE IF:  You have chest pain or trouble breathing.  You have an episode of PSVT that has lasted longer than 20 minutes.  You have passed out from an episode of PSVT. These symptoms may represent a serious problem that is an emergency. Do not wait to see if the symptoms will go away. Get medical help right away. Call your local emergency services (911 in the [place] Do not drive yourself to the hospital.   This information is not intended to replace advice given to you by your health care provider. Make sure you discuss any questions you have with your health care provider.   Document Released: 07/29/2005 Document Revised: 08/19/2014 Document Reviewed: 01/06/2014 Elsevier Interactive Patient Education [employer].

## 2015-07-11 NOTE — ED Provider Notes (Addendum)
CSN: [id number]     Arrival date & time 07/11/15  2022 History   First MD Initiated Contact with Patient 07/11/15 2025     Chief Complaint  Patient presents with  . Tachycardia     (Consider location/radiation/quality/duration/timing/severity/associated sxs/prior Treatment) HPI   Courtney Dunlap is a 64 y.o. femaleWho presents for evaluation of tachycardia. She was treated at the scene of her home, by EMS who found her to be tachycardic to 220. She was treated initially with adenosine 6 mg followed by 12 mg, with conversion delayed until she received 10 mg of Cardizem. This converted her to a sinus tachycardia. During the tachycardia episodes. She had "pressure and a sensation of rapid heart beating." she had a similar episode almost a gallon that time was started on metoprolol, for tachycardia. She denies recent fever, chills, nausea, vomiting, weakness, dizziness, dysuria, or change in her bowel habits. She has chronic back pain. There are no other no modifying factors.   Past Medical History  Diagnosis Date  . A-fib (HCC)     PAF 08/19/11 admitted due to AFIB with RVR  . Angina   . Hypertension   . Hypercholesteremia   . GERD (gastroesophageal reflux disease)   . MI (myocardial infarction) (HCC) 1/13  . Obesity   . Hiatal hernia     with GERD  . Shingles   . Mild cognitive impairment with memory loss   . Allergic dermatitis   . Lipoma     of the brain  . Anxiety   . Depression   . Family history of breast cancer   . Dysrhythmia     afib  . Sleep apnea     severe with AHI 37/hr now on CPAP at 13cm H2O  . Shortness of breath dyspnea   . Hyperthyroidism     pt. reports that she thinks she has hypothyroidism, states MD- [place] took her off med. in the past 1-2 yrs.   . Arthritis     back, knees & ankles   . Cancer (HCC)     skin- lower abdomen  . Breast cancer [facility])    Past Surgical History  Procedure Laterality Date  . Tubal ligation    . Tonsillectomy    . Dilation  and curettage of uterus  1996     Polyp resection   . Hysteroscopy  4/12    D&C  . Appendectomy  28 years ago  . Radioactive seed guided mastectomy with axillary sentinel lymph node biopsy Right 05/18/2015    Procedure: RIGHT BREAST RADIOACTIVE SEED PARTIAL MASTECTOMY WITH RIGHT SENTINEL LYMPH NODE MAPPING;  Surgeon: Harriette Bouillon, MD;  Location: [facility] OR;  Service: General;  Laterality: Right;   Family History  Problem Relation Age of Onset  . Hypertension Mother   . Brain cancer Mother   . Hypertension Sister   . Thyroid disease Sister   . Hypertension Sister   . Hypertension Brother   . Thyroid disease Brother   . Diabetes Maternal Grandmother   . Heart attack Maternal Grandmother   . Breast cancer Paternal Aunt     dx in her late 53s- early 47s  . Melanoma Paternal Aunt   . Breast cancer Paternal Aunt   . Multiple myeloma Paternal Aunt   . Other Daughter 26    ITP   Social History  Substance Use Topics  . Smoking status: Former Smoker -- 4 years    Types: Cigarettes  . Smokeless tobacco: Former Neurosurgeon  Quit date: 05/15/1974     Comment: quit 35-40 years ago  . Alcohol Use: No   OB History    Gravida Para Term Preterm AB TAB SAB Ectopic Multiple Living   '2 2 2       2     '$ Review of Systems  All other systems reviewed and are negative.     Allergies  Codeine; Adhesive; and Latex  Home Medications   Prior to Admission medications   Medication Sig Start Date End Date Taking? Authorizing Provider  acetaminophen (TYLENOL) 500 MG tablet Take 500 mg by mouth every 6 (six) hours as needed for pain.   Yes Historical Provider, MD  atorvastatin (LIPITOR) 20 MG tablet Take 20 mg by mouth daily after breakfast.  07/27/13  Yes Historical Provider, MD  diclofenac sodium (VOLTAREN) 1 % GEL Apply 2 g topically as needed (FOR KNEES).    Yes Historical Provider, MD  hydrochlorothiazide (MICROZIDE) 12.5 MG capsule Take 1 capsule (12.5 mg total) by mouth daily. 04/27/15  Yes Sueanne Margarita, MD  metoprolol succinate (TOPROL-XL) 25 MG 24 hr tablet TAKE 1/2 TABLET BY MOUTH DAILY Patient taking differently: TAKE 1/2 TABLET BY MOUTH DAILY   after breakfast 05/16/15  Yes Sueanne Margarita, MD  Omega-3 Fatty Acids (FISH OIL) 1000 MG CAPS Take 1,000 mg by mouth daily.   Yes Historical Provider, MD  ranitidine (ZANTAC) 150 MG tablet Take 150 mg by mouth 2 (two) times daily as needed for heartburn.   Yes Historical Provider, MD  sotalol (BETAPACE) 80 MG tablet Take 0.5 tablets (40 mg total) by mouth 2 (two) times daily. 03/07/15  Yes Sueanne Margarita, MD  traMADol (ULTRAM) 50 MG tablet Take 1 tablet (50 mg total) by mouth every 6 (six) hours as needed for moderate pain. 05/18/15  Yes Erroll Luna, MD  vitamin B-12 (CYANOCOBALAMIN) 1000 MCG tablet Take 1,000 mcg by mouth daily.   Yes Historical Provider, MD  Vitamin D, Ergocalciferol, (DRISDOL) 50000 UNITS CAPS capsule Take 50,000 Units by mouth every 7 (seven) days. On Saturday 07/27/13  Yes Historical Provider, MD  warfarin (COUMADIN) 5 MG tablet Take 7.5 mg by mouth daily.    Yes Historical Provider, MD  anastrozole (ARIMIDEX) 1 MG tablet Take 1 tablet (1 mg total) by mouth daily. Patient not taking: Reported on 07/11/2015 07/11/15   Chauncey Cruel, MD   BP 110/64 mmHg  Pulse 82  Resp 22  SpO2 97%  LMP 11/21/2010 Physical Exam  Constitutional: She is oriented to person, place, and time. She appears well-developed.  overweight  HENT:  Head: Normocephalic and atraumatic.  Right Ear: External ear normal.  Left Ear: External ear normal.  Eyes: Conjunctivae and EOM are normal. Pupils are equal, round, and reactive to light.  Neck: Normal range of motion and phonation normal. Neck supple.  Cardiovascular: Normal rate, regular rhythm and normal heart sounds.   Pulmonary/Chest: Effort normal and breath sounds normal. No respiratory distress. She has no wheezes. She exhibits no bony tenderness.  Abdominal: Soft. There is no  tenderness.  Musculoskeletal: Normal range of motion. She exhibits no tenderness.  Neurological: She is alert and oriented to person, place, and time. No cranial nerve deficit or sensory deficit. She exhibits normal muscle tone. Coordination normal.  Skin: Skin is warm, dry and intact.  Psychiatric: She has a normal mood and affect. Her behavior is normal. Judgment and thought content normal.  Nursing note and vitals reviewed.   ED Course  Procedures (including critical care time) Medications - No data to display  Patient Vitals for the past 24 hrs:  BP Pulse Resp SpO2  07/11/15 2130 110/64 mmHg 82 22 97 %  07/11/15 2115 122/67 mmHg 89 18 96 %  07/11/15 2100 109/64 mmHg 84 18 97 %  07/11/15 2045 137/74 mmHg 74 16 97 %  07/11/15 2030 142/86 mmHg 111 16 98 %    10:44 PM Reevaluation with update and discussion. After initial assessment and treatment, an updated evaluation reveals patient remains comfortable. Patient family members are upset that a tech came into the room, and try to take the patient. "Upstairs". The tech had gone into the room to transfer a patient. The tech was able to discern that this patient was not the one who upstairs, and left the room to take another patient upstairs. I explained that this was an error and I apologized to the family. They then asked if I had called the cardiologist, and I explained to them that had not because I was in the process of evaluating her. I assured them that I would call the cardiologist at this time. Mehreen Azizi L   10:40 5 PM- Cardiology consultation-  Dr. Lamar Sprinkles responded. He recommended increasing metoprolol XL to 25 mg each day, and giving her pill in the pocket using metoprolol 25 mg. Furthermore, he recommends following up with Dr. Radford Pax in 1 or 2 weeks. He will contact Dr. Radford Pax by messaging, to inform her of this requirement.   Labs Review Labs Reviewed  BASIC METABOLIC PANEL - Abnormal; Notable for the following:     Glucose, Bld 143 (*)    All other components within normal limits  CBC  I-STAT TROPOININ, ED    Imaging Review Dg Chest 2 View  07/11/2015  CLINICAL DATA:  64 year old female with chest pain and shortness of breath EXAM: CHEST  2 VIEW COMPARISON:  Radiograph dated 12/03/2014 FINDINGS: Evaluation is limited due to overlying support lines and devices. Two views of the chest do not demonstrate any focal consolidation, pleural effusion, or pneumothorax. Stable cardiac silhouette. There is degenerative changes of the spine. No acute fracture. IMPRESSION: No active cardiopulmonary disease. Electronically Signed   By: Anner Crete M.D.   On: 07/11/2015 22:02   I have personally reviewed and evaluated these images and lab results as part of my medical decision-making.   EKG Interpretation None         Date: 07/12/15- 20:25- MUSE hyperlink is inactive  Rate: 111  Rhythm: tachycardia  QRS Axis: normal  PR and QT Intervals: normal  ST/T Wave abnormalities: nonspecific ST changes  PR and QRS Conduction Disutrbances:none  Narrative Interpretation:   Old EKG Reviewed: changes noted- since last tracing, rate is faster   MDM   Final diagnoses:  SVT (supraventricular tachycardia) (HCC)    Recurrent SVT, despite treatment with beta blocker. No ACS, PE or pneumonia suspected. Will discharge with increased dose of beta-blockade.  Nursing Notes Reviewed/ Care Coordinated Applicable Imaging Reviewed Interpretation of Laboratory Data incorporated into ED treatment  The patient appears reasonably screened and/or stabilized for discharge and I doubt any other medical condition or other [employer] requiring further screening, evaluation, or treatment in the ED at this time prior to discharge.  Plan: Home Medications- Metoprolol as above; Home Treatments- rest; return here if the recommended treatment, does not improve the symptoms; Recommended follow up- Cardiology 1 week    Daleen Bo,  MD 07/11/15 0375  Daleen Bo, MD 07/12/15  0111 

## 2015-07-11 NOTE — Telephone Encounter (Signed)
Gave patient avs report and appointments for February.  °

## 2015-07-11 NOTE — Progress Notes (Signed)
Courtney Dunlap  Telephone:([phone]) [phone] [phone]) [phone]     ID: Courtney Dunlap DOB: [birth year]  MR#: [id number]  WUG#:[id number]  Patient Care Team: Courtney Bowen, MD as PCP - General (Endocrinology) Courtney Low, MD as Consulting Physician (Internal Medicine) Courtney Margarita, MD as Consulting Physician (Cardiology) Courtney Luna, MD as Consulting Physician (General Surgery) Courtney Cruel, MD as Consulting Physician (Oncology) Courtney Gibson, MD as Attending Physician (Radiation Oncology) Courtney Germany, RN as Registered Nurse Courtney Kaufmann, RN as Registered Nurse Courtney Salon, MD as Consulting Physician (Gynecology) Courtney Cheese, NP as Nurse Practitioner (Nurse Practitioner) PCP: Courtney Stack, MD OTHER MD: Courtney Dunlap M.D.  CHIEF COMPLAINT: Estrogen receptor positive breast cancer  CURRENT TREATMENT: adjuvant radiation   BREAST CANCER HISTORY: From the original intake note:  Courtney Dunlap had routine screening mammography at [facility] 04/11/2015 showing a new irregular lesion in the right breast. Diagnostic right unilateral mammogram and ultrasound 04/13/2015 found the breast density to be category B. In the right breast at the 9:00 position there was a mass with indistinct margins associated with architectural distortion. By ultrasound this was a 1 cm. Ultrasound of the axilla showed no abnormality.  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  The patient's subsequent history is as detailed below.   INTERVAL HISTORY: Courtney Dunlap returns today for follow-up of her estrogen receptor positive breast cancer. Since her last visit here she underwent her right lumpectomy and sentinel lymph  node sampling, on 05/18/2015. The final pathology (Courtney Dunlap) showed an invasive ductal carcinoma, grade 1, measuring 1.5 cm. All 3 sentinel lymph nodes were clear. Margins were negative. Repeat HER-2 was again negative with a signals ratio of 1.25, and the cell #2.00. We also obtained an Oncotype DX on this sample. The score was 11 predicting a 7% risk of outside the breast recurrence within 10 years if the patient's only systemic therapy is tamoxifen for 5 years.  Accordingly the patient was referred to radiation, which she started 06/26/2015. Final treatment is scheduled for 07/31/2015. She is here to start discussion of anti-estrogens.  REVIEW OF SYSTEMS: She did well with the surgery, without unusual pain, fever, or bleeding problems. She does complain of some soreness in the surgical breast and she has the occasional "shooting pains" that patient's complain about frequently. She is tolerating radiation well so far, with fatigue as her only complaint. She is not exercising regularly. Previously she would go to [facility] gym and do water aerobics. Aside from these issues a detailed review of systems today was noncontributory  PAST MEDICAL HISTORY: Past Medical History  Diagnosis Date  . A-fib (Courtney Dunlap)     PAF 08/19/11 admitted due to AFIB with RVR  . Angina   . Hypertension   . Hypercholesteremia   . GERD (gastroesophageal reflux disease)   . MI (myocardial infarction) ([place]) 1/13  . Obesity   . Hiatal hernia     with GERD  . Shingles   . Mild cognitive impairment with memory loss   . Allergic dermatitis   . Lipoma     of the brain  . Anxiety   . Depression   . Family history of breast cancer   . Dysrhythmia     afib  .  Sleep apnea     severe with AHI 37/hr now on CPAP at 13cm H2O  . Shortness of breath dyspnea   . Hyperthyroidism     pt. reports that she thinks she has hypothyroidism, states MD- [place] took her off med. in the past 1-2 yrs.   . Arthritis     back, knees & ankles   .  Cancer (Courtney Dunlap)     skin- lower abdomen  . Breast cancer (Courtney Dunlap)     PAST SURGICAL HISTORY: Past Surgical History  Procedure Laterality Date  . Tubal ligation    . Tonsillectomy    . Dilation and curettage of uterus  1996     Polyp resection   . Hysteroscopy  4/12    D&C  . Appendectomy  28 years ago  . Radioactive seed guided mastectomy with axillary sentinel lymph node biopsy Right 05/18/2015    Procedure: RIGHT BREAST RADIOACTIVE SEED PARTIAL MASTECTOMY WITH RIGHT SENTINEL LYMPH NODE MAPPING;  Surgeon: Courtney Luna, MD;  Location: [place] [facility];  Service: General;  Laterality: Right;    FAMILY HISTORY Family History  Problem Relation Age of Onset  . Hypertension Mother   . Brain cancer Mother   . Hypertension Sister   . Thyroid disease Sister   . Hypertension Sister   . Hypertension Brother   . Thyroid disease Brother   . Diabetes Maternal Grandmother   . Heart attack Maternal Grandmother   . Breast cancer Paternal Aunt     dx in her late 58s- early 72s  . Melanoma Paternal Aunt   . Breast cancer Paternal Aunt   . Multiple myeloma Paternal Aunt   . Other Daughter 95    ITP   the patient's father was diagnosed with squamous cell cancer of the throat at age 57 and died 49 years later. The patient's mother died from end-stage renal disease at age 94. The patient had one brother, 2 sisters. There is no cancer in the immediate family except as noted, but one of the patient's father's 5 sisters was diagnosed with breast cancer before the age of 71. Another 1 had multiple myeloma. On the patient's mother sides there is a history of brain tumor in a niece.   GYNECOLOGIC HISTORY:  Patient's last menstrual period was 11/21/2010. Menarche age 44, first live birth age 89. The patient is GX P2. She went through menopause in 2002 or thereabouts. She did not take hormone replacement. 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 Her husband Courtney Dunlap is a retired Clinical biochemist. Daughter Courtney "Sharyn Lull" Temple lives in [place] and is a housewife. Daughter "[name]" and Dunlap this in [place]. 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 The patient has 2 grandchildren and 3 great-grandchildren. She is a Psychologist, forensic.    ADVANCED DIRECTIVES: Not in place   HEALTH MAINTENANCE: Social History  Substance Use Topics  . Smoking status: Former Smoker -- 4 years    Types: Cigarettes  . Smokeless tobacco: Former Systems developer    Quit date: 05/15/1974     Comment: quit 35-40 years ago  . Alcohol Use: No     Colonoscopy: ?1997  PAP: FEB 2016  Bone density: 05/09/2009 at [place], normal  Lipid panel:  Allergies  Allergen Reactions  . Codeine Swelling    Facial/lips  . Adhesive [Tape] Itching and Rash  MUST USE PAPER TAPE-CAUSES BLISTERS AND BRUISES ALSO  . Latex Itching and Rash    Current Outpatient Prescriptions  Medication Sig Dispense Refill  . acetaminophen (TYLENOL) 500 MG tablet Take 500 mg by mouth every 6 (six) hours as needed for pain.    [name] atorvastatin (LIPITOR) 20 MG tablet Take 20 mg by mouth daily after breakfast.     . diclofenac sodium (VOLTAREN) 1 % GEL Apply 2 g topically as needed (FOR KNEES).     . hydrochlorothiazide (MICROZIDE) 12.5 MG capsule Take 1 capsule (12.5 mg total) by mouth daily. 90 capsule 3  . metoprolol succinate (TOPROL-XL) 25 MG 24 hr tablet TAKE 1/2 TABLET BY MOUTH DAILY (Patient taking differently: TAKE 1/2 TABLET BY MOUTH DAILY   after breakfast) 15 tablet 1  . Omega-3 Fatty Acids (FISH OIL) 1000 MG CAPS Take 1,000 mg by mouth daily.    . ranitidine (ZANTAC) 150 MG tablet Take 150 mg by mouth 2 (two) times daily as needed for heartburn.    . sotalol (BETAPACE) 80 MG tablet Take 0.5 tablets (40 mg total) by mouth 2 (two) times daily. 90 tablet 3  . traMADol  (ULTRAM) 50 MG tablet Take 1 tablet (50 mg total) by mouth every 6 (six) hours as needed for moderate pain. 30 tablet 0  . vitamin B-12 (CYANOCOBALAMIN) 1000 MCG tablet Take 1,000 mcg by mouth daily.    . Vitamin D, Ergocalciferol, (DRISDOL) 50000 UNITS CAPS capsule Take 50,000 Units by mouth every 7 (seven) days. On Saturday    . warfarin (COUMADIN) 5 MG tablet Take 7.5 mg by mouth daily.      No current facility-administered medications for this visit.    OBJECTIVE: Middle-aged white woman in no acute distress Filed Vitals:   07/11/15 1137  BP: 128/60  Pulse: 75  Temp: 98.2 F (36.8 C)  Resp: 18     Body mass index is 39.07 kg/(m^2).    ECOG FS:1 - Symptomatic but completely ambulatory  Sclerae unicteric, EOMs intact Oropharynx clear, slightly dry No cervical or supraclavicular adenopathy Lungs no rales or rhonchi Heart regular rate and rhythm Abd soft, obese, nontender, positive bowel sounds MSK no focal spinal tenderness, no upper extremity lymphedema Neuro: nonfocal, well oriented, anxious affect Breasts: The right breast is status post recent surgery. The incision, which is lateral, is not really visible from the front. The cosmetic result is excellent. There is no dehiscence, swelling, or erythema. The right axilla is benign. The left breast is unremarkable.   LAB RESULTS:  CMP     Component Value Date/Time   NA 140 07/04/2015 1206   NA 139 05/16/2015 1547   K 3.9 07/04/2015 1206   K 4.0 05/16/2015 1547   CL 106 05/16/2015 1547   CO2 28 07/04/2015 1206   CO2 26 05/16/2015 1547   GLUCOSE 86 07/04/2015 1206   GLUCOSE 86 05/16/2015 1547   BUN 16.0 07/04/2015 1206   BUN 17 05/16/2015 1547   CREATININE 0.8 07/04/2015 1206   CREATININE 0.81 05/16/2015 1547   CALCIUM 9.3 07/04/2015 1206   CALCIUM 9.3 05/16/2015 1547   PROT 7.0 07/04/2015 1206   PROT 6.9 05/16/2015 1547   ALBUMIN 3.6 07/04/2015 1206   ALBUMIN 3.7 05/16/2015 1547   AST 22 07/04/2015 1206   AST 33  05/16/2015 1547   ALT 22 07/04/2015 1206   ALT 39 05/16/2015 1547   ALKPHOS 130 07/04/2015 1206   ALKPHOS 102 05/16/2015 1547   BILITOT 0.72 07/04/2015 1206  BILITOT 0.8 05/16/2015 1547   GFRNONAA >60 05/16/2015 1547   GFRAA >60 05/16/2015 1547    INo results found for: SPEP, UPEP  Lab Results  Component Value Date   WBC 6.8 07/04/2015   NEUTROABS 4.4 07/04/2015   HGB 13.3 07/04/2015   HCT 39.9 07/04/2015   MCV 93.2 07/04/2015   PLT 210 07/04/2015      Chemistry      Component Value Date/Time   NA 140 07/04/2015 1206   NA 139 05/16/2015 1547   K 3.9 07/04/2015 1206   K 4.0 05/16/2015 1547   CL 106 05/16/2015 1547   CO2 28 07/04/2015 1206   CO2 26 05/16/2015 1547   BUN 16.0 07/04/2015 1206   BUN 17 05/16/2015 1547   CREATININE 0.8 07/04/2015 1206   CREATININE 0.81 05/16/2015 1547      Component Value Date/Time   CALCIUM 9.3 07/04/2015 1206   CALCIUM 9.3 05/16/2015 1547   ALKPHOS 130 07/04/2015 1206   ALKPHOS 102 05/16/2015 1547   AST 22 07/04/2015 1206   AST 33 05/16/2015 1547   ALT 22 07/04/2015 1206   ALT 39 05/16/2015 1547   BILITOT 0.72 07/04/2015 1206   BILITOT 0.8 05/16/2015 1547       No results found for: LABCA2  No components found for: LABCA125  No results for input(s): INR in the last 168 hours.  Urinalysis    Component Value Date/Time   BILIRUBINUR n 09/30/2014 0911   PROTEINUR n 09/30/2014 0911   UROBILINOGEN negative 09/30/2014 0911   NITRITE n 09/30/2014 0911   LEUKOCYTESUR Negative 09/30/2014 0911    STUDIES: No results found.  ASSESSMENT: 64 y.o. [place] woman, status post right breast biopsy 04/18/2015 for a clinical T1b N0, stage IA invasive ductal carcinoma, grade 2, strongly estrogen and progesterone receptor positive, HER-2 not amplified, with an MIB-1 of 3%  (1) status post right lumpectomy and sentinel lymph node sampling 05/18/2015 for a  pT1c pN0, stage IA invasive ductal carcinoma, grade 1, repeat HER-2 again  negative  (2) Oncotype DX score of 11 predicts an outside the breast recurrence within 10 years of 7% if the patient's only systemic therapy is tamoxifen for 5 years. It also predicts no benefit from chemotherapy  (3) adjuvant radiation in process  (4) genetics testing 05/11/2015 through the Breast/Ovarian gene panel offered by [facility] found no deleterious mutations in ATM, BARD1, BRCA1, BRCA2, BRIP1, CDH1, CHEK2, EPCAM, FANCC, MLH1, MSH2, MSH6, NBN, PALB2, PMS2, PTEN, RAD51C, RAD51D, TP53, and XRCC2.  (5) to start anastrozole 08/12/2014  PLAN I spent approximately 40 minutes with [name] and her daughter going over her situation, which is generally very favorable. She had an early-stage nonaggressive slow-growing tumor and this correlates with the Oncotype results, which tell us if she takes tamoxifen for 5 years her risk of recurrence in her bones, liver or lung I would be in the 7% range.  We discussed the fact that 5 years of anastrozole is superior to 5 years of tamoxifen and accordingly that would be our preferred drug for her. The fact that tamoxifen also can be related to blood clots further supports this choice.  We also discussed the fact that her tumor is "Dunlap risk" and therefore would not benefit from chemotherapy.  We discussed the possible toxicities, side effects and complications of anastrozole and I went ahead and place the order for her but suggested she not started until January 1 at the earliest so she has a little time  to get over her radiation treatments and does not confuse the fatigue and other side effects from radiation with possible side effects from anastrozole.  She tells me she recently had a bone density through Dr. Forde Dandy and that it was "fine". That is encouraging. We will obtain those records for future reference.  Otherwise she will complete her radiation sometime mid December. She will return to see me mid February. If she tolerates the anastrozole well the  plan will be to continue that for 5 years  She has a good understanding of the overall plan. She agrees with it. She knows a goal of treatment in her case is cure. She will call with any problems that may develop before her next visit here.      :Courtney Cruel, MD   07/11/2015 11:54 AM Medical Oncology and Hematology [facility] [place], [place] [address] Tel. [phone]    Fax. [phone]

## 2015-07-11 NOTE — ED Notes (Addendum)
Pt reports she was home eating [place] chili and she felt her heart begin to race.  EMS found that she had a heart rate of 220, she was given 6 of adenosine (no change), 12 of adenosine, then 10 of cardizem at which time she converted.  No report of diaphoresis however did report some SOB and dizziness which has resolved.

## 2015-07-12 ENCOUNTER — Ambulatory Visit
Admission: RE | Admit: 2015-07-12 | Discharge: 2015-07-12 | Disposition: A | Discharge status: Home / Self Care | Payer: BC Managed Care – PPO | Source: Ambulatory Visit | Attending: Radiation Oncology | Admitting: Radiation Oncology

## 2015-07-12 ENCOUNTER — Telehealth: Payer: Self-pay | Admitting: Cardiology

## 2015-07-12 ENCOUNTER — Telehealth: Payer: Self-pay

## 2015-07-12 DIAGNOSIS — Z510 Encounter for antineoplastic radiation therapy: Secondary | ICD-10-CM | POA: Diagnosis not present

## 2015-07-12 NOTE — Telephone Encounter (Signed)
Patient st while eating dinner yesterday, her HR started climbing. The pt st her HR at home reached 170 bpm. EMS came and her HR read 240 in the ambulance. She was given "three rounds of medicine" and when she got to the hospital, it was normal. She st her Metoprolol was changed. She was prescribed Toprol 25 mg daily with Lopressor 25 mg TID as needed for palpitations and high heart rate. The patient would like Dr. Radford Pax to review her ED records and see if she agrees with medication changes and any other recommendations.  Patient has FU with Dr. Radford Pax 12/7.

## 2015-07-12 NOTE — Telephone Encounter (Signed)
New message      Pt went to ER last night by EMS because of AFIB.  Please call

## 2015-07-12 NOTE — Telephone Encounter (Signed)
Courtney Dunlap called today to say that she had an episode of a rapid heart beat last night and went to the ED. She feels better today, but wanted advice about if she should have radiation today. I went and spoke to Dr. Isidore Moos, and she told me it was fine for Courtney Dunlap to have treatment today, as long as she felt well enough to come. I called back and spoke to Courtney Dunlap and told her what Dr. Isidore Moos said, she voiced understanding and will be here for treatment today.

## 2015-07-12 NOTE — Telephone Encounter (Signed)
Agree with medicine recommendations. Please have her seen in afib clinic on Thursday 12/1

## 2015-07-13 ENCOUNTER — Ambulatory Visit
Admission: RE | Admit: 2015-07-13 | Discharge: 2015-07-13 | Disposition: A | Discharge status: Home / Self Care | Payer: BC Managed Care – PPO | Source: Ambulatory Visit | Attending: Radiation Oncology | Admitting: Radiation Oncology

## 2015-07-13 ENCOUNTER — Ambulatory Visit (HOSPITAL_COMMUNITY)
Admission: RE | Admit: 2015-07-13 | Discharge: 2015-07-13 | Disposition: A | Discharge status: Home / Self Care | Payer: BC Managed Care – PPO | Source: Ambulatory Visit | Attending: Nurse Practitioner | Admitting: Nurse Practitioner

## 2015-07-13 ENCOUNTER — Ambulatory Visit (HOSPITAL_COMMUNITY): Payer: BC Managed Care – PPO | Admitting: Nurse Practitioner

## 2015-07-13 ENCOUNTER — Telehealth: Payer: Self-pay | Admitting: Obstetrics & Gynecology

## 2015-07-13 VITALS — BP 124/66 | HR 84 | Ht 62.0 in | Wt 216.4 lb

## 2015-07-13 DIAGNOSIS — E7800 Pure hypercholesterolemia, unspecified: Secondary | ICD-10-CM | POA: Diagnosis not present

## 2015-07-13 DIAGNOSIS — E669 Obesity, unspecified: Secondary | ICD-10-CM | POA: Diagnosis not present

## 2015-07-13 DIAGNOSIS — Z8249 Family history of ischemic heart disease and other diseases of the circulatory system: Secondary | ICD-10-CM | POA: Insufficient documentation

## 2015-07-13 DIAGNOSIS — Z6839 Body mass index (BMI) 39.0-39.9, adult: Secondary | ICD-10-CM | POA: Insufficient documentation

## 2015-07-13 DIAGNOSIS — I471 Supraventricular tachycardia: Secondary | ICD-10-CM | POA: Diagnosis not present

## 2015-07-13 DIAGNOSIS — G4730 Sleep apnea, unspecified: Secondary | ICD-10-CM | POA: Diagnosis not present

## 2015-07-13 DIAGNOSIS — Z885 Allergy status to narcotic agent status: Secondary | ICD-10-CM | POA: Diagnosis not present

## 2015-07-13 DIAGNOSIS — I10 Essential (primary) hypertension: Secondary | ICD-10-CM | POA: Diagnosis not present

## 2015-07-13 DIAGNOSIS — E0590 Thyrotoxicosis, unspecified without thyrotoxic crisis or storm: Secondary | ICD-10-CM | POA: Insufficient documentation

## 2015-07-13 DIAGNOSIS — K219 Gastro-esophageal reflux disease without esophagitis: Secondary | ICD-10-CM | POA: Insufficient documentation

## 2015-07-13 DIAGNOSIS — Z79899 Other long term (current) drug therapy: Secondary | ICD-10-CM | POA: Diagnosis not present

## 2015-07-13 DIAGNOSIS — Z8349 Family history of other endocrine, nutritional and metabolic diseases: Secondary | ICD-10-CM | POA: Insufficient documentation

## 2015-07-13 DIAGNOSIS — Z510 Encounter for antineoplastic radiation therapy: Secondary | ICD-10-CM | POA: Diagnosis not present

## 2015-07-13 DIAGNOSIS — Z87891 Personal history of nicotine dependence: Secondary | ICD-10-CM | POA: Insufficient documentation

## 2015-07-13 DIAGNOSIS — Z91040 Latex allergy status: Secondary | ICD-10-CM | POA: Insufficient documentation

## 2015-07-13 DIAGNOSIS — I252 Old myocardial infarction: Secondary | ICD-10-CM | POA: Insufficient documentation

## 2015-07-13 DIAGNOSIS — C50911 Malignant neoplasm of unspecified site of right female breast: Secondary | ICD-10-CM | POA: Diagnosis not present

## 2015-07-13 DIAGNOSIS — Z7902 Long term (current) use of antithrombotics/antiplatelets: Secondary | ICD-10-CM | POA: Diagnosis not present

## 2015-07-13 NOTE — Telephone Encounter (Signed)
Left message on voicemail to call and reschedule cancelled appointment. °

## 2015-07-13 NOTE — Telephone Encounter (Signed)
Left message to call back  

## 2015-07-13 NOTE — Telephone Encounter (Signed)
Scheduled patient today at 1330 with Roderic Palau in the [facility]. Reviewed directions to OV with patient and she has no questions.  To Roderic Palau.

## 2015-07-14 ENCOUNTER — Encounter (HOSPITAL_COMMUNITY): Payer: Self-pay | Admitting: Nurse Practitioner

## 2015-07-14 ENCOUNTER — Ambulatory Visit
Admission: RE | Admit: 2015-07-14 | Discharge: 2015-07-14 | Disposition: A | Discharge status: Home / Self Care | Payer: BC Managed Care – PPO | Source: Ambulatory Visit | Attending: Radiation Oncology | Admitting: Radiation Oncology

## 2015-07-14 DIAGNOSIS — Z510 Encounter for antineoplastic radiation therapy: Secondary | ICD-10-CM | POA: Diagnosis not present

## 2015-07-14 NOTE — Progress Notes (Signed)
Patient ID: Courtney Dunlap, female   DOB: [birth year], 64 y.o.   MRN: [id number]     Primary Care Physician: [name], MD Referring 80 f/u Cardiologist: Dr. Turner   Courtney Dunlap is a 64 y.o. female with a h/o SVT episodes going back to 2008. Intitially episodes were thought to be contributed to by hyperthyroidism.The first EKG I can see in EPIC was in 2013 and it appeared to be a type of atrial flutter, but she has been told she has some afib in the past, but of the EKG's in EPIC, I cannot find any that document afib. She was in the ER in August with an episode with atrial rate 220 bpm, and responded well to adenosine. She returned to the ER 11/29 again with a sudden onset tachycardia with rate 223 bpm, documented by EMS and I have copy of the tracing, appears to be a 1:1 atypical aflutter . She had two doses of adenosine, which slowed her down to what appears to be an ectopic atrial tachycardia. After Cardizem 10 mg IV, she converted. She has been on sotalol 40 mg bid for years. ER MD added toprol xl 25 mg a day and if the tachycardia reoccurs, told her to take lopressor 25 mg q 8 hours until it breaks. Pt is so symptomatic with arrhythmia,and runs so fast, doubt if this approach would be effective. He thyroid is now in normal range. She can identify no triggers. She is currently under going radiation for rt breast cancer and will finish this month.   Today, she denies symptoms of palpitations, chest pain, shortness of breath, orthopnea, PND, lower extremity edema, dizziness, presyncope, syncope, or neurologic sequela. The patient is tolerating medications without difficulties and is otherwise without complaint today.   Past Medical History  Diagnosis Date  . A-fib ([place])     PAF 08/19/11 admitted due to AFIB with RVR  . Angina   . Hypertension   . Hypercholesteremia   . GERD (gastroesophageal reflux disease)   . MI (myocardial infarction) ([place]) 1/13  . Obesity   . Hiatal  hernia     with GERD  . Shingles   . Mild cognitive impairment with memory loss   . Allergic dermatitis   . Lipoma     of the brain  . Anxiety   . Depression   . Family history of breast cancer   . Dysrhythmia     afib  . Sleep apnea     severe with AHI 37/hr now on CPAP at 13cm H2O  . Shortness of breath dyspnea   . Hyperthyroidism     pt. reports that she thinks she has hypothyroidism, states MD- [place] took her off med. in the past 1-2 yrs.   . Arthritis     back, knees & ankles   . Cancer ([place])     skin- lower abdomen  . Breast cancer [facility])    Past Surgical History  Procedure Laterality Date  . Tubal ligation    . Tonsillectomy    . Dilation and curettage of uterus  1996     Polyp resection   . Hysteroscopy  4/12    D&C  . Appendectomy  28 years ago  . Radioactive seed guided mastectomy with axillary sentinel lymph node biopsy Right 05/18/2015    Procedure: RIGHT BREAST RADIOACTIVE SEED PARTIAL MASTECTOMY WITH RIGHT SENTINEL LYMPH NODE MAPPING;  Surgeon: Erroll Luna, MD;  Location: [facility];  Service: General;  Laterality: Right;  Current Outpatient Prescriptions  Medication Sig Dispense Refill  . acetaminophen (TYLENOL) 500 MG tablet Take 500 mg by mouth every 6 (six) hours as needed for pain.    [name] atorvastatin (LIPITOR) 20 MG tablet Take 20 mg by mouth daily after breakfast.     . diclofenac sodium (VOLTAREN) 1 % GEL Apply 2 g topically as needed (FOR KNEES).     . hydrochlorothiazide (MICROZIDE) 12.5 MG capsule Take 1 capsule (12.5 mg total) by mouth daily. (Patient taking differently: Take 25 mg by mouth daily. ) 90 capsule 3  . metoprolol (LOPRESSOR) 25 MG tablet Take 1 tablet (25 mg total) by mouth 3 (three) times daily as needed (for palpations and heart rate greater than 140 per minute). 30 tablet 0  . metoprolol succinate (TOPROL-XL) 25 MG 24 hr tablet Take 1 tablet (25 mg total) by mouth daily. 30 tablet 0  . Omega-3 Fatty Acids (FISH OIL) 1000 MG CAPS Take  1,000 mg by mouth daily.    . ranitidine (ZANTAC) 150 MG tablet Take 150 mg by mouth 2 (two) times daily as needed for heartburn.    . sotalol (BETAPACE) 80 MG tablet Take 0.5 tablets (40 mg total) by mouth 2 (two) times daily. 90 tablet 3  . traMADol (ULTRAM) 50 MG tablet Take 1 tablet (50 mg total) by mouth every 6 (six) hours as needed for moderate pain. 30 tablet 0  . vitamin B-12 (CYANOCOBALAMIN) 1000 MCG tablet Take 1,000 mcg by mouth daily.    . Vitamin D, Ergocalciferol, (DRISDOL) 50000 UNITS CAPS capsule Take 50,000 Units by mouth every 7 (seven) days. On Saturday    . warfarin (COUMADIN) 5 MG tablet Take 7.5 mg by mouth daily.     [name] anastrozole (ARIMIDEX) 1 MG tablet Take 1 tablet (1 mg total) by mouth daily. (Patient not taking: Reported on 07/11/2015) 90 tablet 4   No current facility-administered medications for this encounter.    Allergies  Allergen Reactions  . Codeine Swelling    Facial/lips  . Adhesive [Tape] Itching and Rash    MUST USE PAPER TAPE-CAUSES BLISTERS AND BRUISES ALSO  . Latex Itching and Rash    Social History   Social History  . Marital Status: Married    Spouse Name: N/A  . Number of Children: 2  . Years of Education: N/A   Occupational History  .  [employer] [place] History Main Topics  . Smoking status: Former Smoker -- 4 years    Types: Cigarettes  . Smokeless tobacco: Former Systems developer    Quit date: 05/15/1974     Comment: quit 35-40 years ago  . Alcohol Use: No  . Drug Use: No  . Sexual Activity: Not Currently   Other Topics Concern  . Not on file   Social History Narrative    Family History  Problem Relation Age of Onset  . Hypertension Mother   . Brain cancer Mother   . Hypertension Sister   . Thyroid disease Sister   . Hypertension Sister   . Hypertension Brother   . Thyroid disease Brother   . Diabetes Maternal Grandmother   . Heart attack Maternal Grandmother   . Breast cancer Paternal Aunt     dx in her  late 35s- early 67s  . Melanoma Paternal Aunt   . Breast cancer Paternal Aunt   . Multiple myeloma Paternal Aunt   . Other Daughter 62    ITP    ROS- All systems are  reviewed and negative except as per the HPI above  Physical Exam: Filed Vitals:   07/13/15 1337  BP: 124/66  Pulse: 84  Height: '5\' 2"'  (1.575 m)  Weight: 216 lb 6.4 oz (98.158 kg)    GEN- The patient is well appearing, alert and oriented x 3 today.   Head- normocephalic, atraumatic Eyes-  Sclera clear, conjunctiva pink Ears- hearing intact Oropharynx- clear Neck- supple, no JVP Lymph- no cervical lymphadenopathy Lungs- Clear to ausculation bilaterally, normal work of breathing Heart- Regular rate and rhythm, no murmurs, rubs or gallops, PMI not laterally displaced GI- soft, NT, ND, + BS Extremities- no clubbing, cyanosis, or edema MS- no significant deformity or atrophy Skin- no rash or lesion Psych- euthymic mood, full affect Neuro- strength and sensation are intact  EKG-EKG from ER shows ectopic atrial tachycardia at 111 bpm, after two doses of adenosine Monitor strip from EMS prior to adenosine shows 1:1 atypical atrial flutter  EKG 2013 shows aflutter with v rate 141 EKG 12/1 NSR with v rate 84 bpm, Pr int 136 ms, qrs int 86 ms, Qtc 415 ms Epic records reveiwed, ER visits, Dr. Radford Pax notes, EKG's labs  Assessment and Plan: 1. PSVT- ekg's reviewed with Dr. Rayann Heman and thought to represent an atypical aflutter or ectopic atrial tachycardia I will make an appointment with him to discuss ablation vrs increase in sotalol dose or another antiarrythmic  For now continue sotalol at 40 bid and toprol xl 25 mg a day She has been given by ER MD  metoprolol tartrate 25 mg becoming  very symptomatic.  2. Rt breast cancer Finishing radiation this month  3. H/o hyperthyroidism TSH/T4 normal when checked in 9/16  Appointment requested with Dr. Rayann Heman  In the next 2-4 weeks Afib clinic as needed  Butch Penny C.  Sigifredo Pignato, [facility] [address] [place], [place] [address] [phone]

## 2015-07-17 ENCOUNTER — Ambulatory Visit
Admission: RE | Admit: 2015-07-17 | Discharge: 2015-07-17 | Disposition: A | Discharge status: Home / Self Care | Payer: BC Managed Care – PPO | Source: Ambulatory Visit | Attending: Radiation Oncology | Admitting: Radiation Oncology

## 2015-07-17 ENCOUNTER — Encounter: Payer: Self-pay | Admitting: Radiation Oncology

## 2015-07-17 VITALS — BP 127/64 | HR 55 | Temp 98.5°F | Ht 62.0 in | Wt 215.4 lb

## 2015-07-17 DIAGNOSIS — C50411 Malignant neoplasm of upper-outer quadrant of right female breast: Secondary | ICD-10-CM | POA: Insufficient documentation

## 2015-07-17 DIAGNOSIS — Z510 Encounter for antineoplastic radiation therapy: Secondary | ICD-10-CM | POA: Diagnosis not present

## 2015-07-17 MED ORDER — RADIAPLEXRX EX GEL
Freq: Once | CUTANEOUS | Status: AC
  Administered 2015-07-17: 12:00:00 via TOPICAL

## 2015-07-17 NOTE — Progress Notes (Signed)
Courtney Dunlap is here for her 15th fraction of radiation to her Right Breast. She admits to fatigue, and his sleeping more than usual (about 8 hours a night). Her Right Anterior breast and the area underneath her breast is slightly red, and she reports tender. She is using the radiaplex as directed twice daily. She also reports an occasional "shooting" pain in her Right Breast.   BP 127/64 mmHg  Pulse 55  Temp(Src) 98.5 F (36.9 C)  Ht 5\' 2"  (1.575 m)  Wt 215 lb 6.4 oz (97.705 kg)  BMI 39.39 kg/m2  LMP 11/21/2010   Wt Readings from Last 3 Encounters:  07/17/15 215 lb 6.4 oz (97.705 kg)  07/13/15 216 lb 6.4 oz (98.158 kg)  07/11/15 217 lb 3.2 oz (98.521 kg)

## 2015-07-17 NOTE — Addendum Note (Signed)
Encounter addended by: Ernst Spell, RN on: 07/17/2015  3:12 PM<BR>     Documentation filed: Inpatient MAR

## 2015-07-17 NOTE — Progress Notes (Signed)
   Weekly Management Note:  Outpatient    ICD-9-CM ICD-10-CM   1. Breast cancer of upper-outer quadrant of right female breast (HCC) 174.4 C50.411 hyaluronate sodium (RADIAPLEXRX) gel    Current Dose: 30 Gy  Projected Dose: 50 Gy   Narrative:  The patient presents for routine under treatment assessment.  CBCT/MVCT images/Port film x-rays were reviewed.  The chart was checked.  Skin is pinker, slightly itchy over right breast    Physical Findings:  height is 5\' 2"  (1.575 m) and weight is 215 lb 6.4 oz (97.705 kg). Her temperature is 98.5 F (36.9 C). Her blood pressure is 127/64 and her pulse is 55.   Wt Readings from Last 3 Encounters:  07/17/15 215 lb 6.4 oz (97.705 kg)  07/13/15 216 lb 6.4 oz (98.158 kg)  07/11/15 217 lb 3.2 oz (98.521 kg)    Skin of right breast intact, moderately erythematous  Impression:  The patient is tolerating radiotherapy.  Plan:  Continue radiotherapy as planned.  Use hydrocortisone 1% cream prn itching  ________________________________   Eppie Gibson, M.D.

## 2015-07-18 ENCOUNTER — Ambulatory Visit
Admission: RE | Admit: 2015-07-18 | Discharge: 2015-07-18 | Disposition: A | Discharge status: Home / Self Care | Payer: BC Managed Care – PPO | Source: Ambulatory Visit | Attending: Radiation Oncology | Admitting: Radiation Oncology

## 2015-07-18 DIAGNOSIS — Z510 Encounter for antineoplastic radiation therapy: Secondary | ICD-10-CM | POA: Diagnosis not present

## 2015-07-19 ENCOUNTER — Ambulatory Visit: Payer: BC Managed Care – PPO | Admitting: Cardiology

## 2015-07-19 ENCOUNTER — Ambulatory Visit
Admission: RE | Admit: 2015-07-19 | Discharge: 2015-07-19 | Disposition: A | Discharge status: Home / Self Care | Payer: BC Managed Care – PPO | Source: Ambulatory Visit | Attending: Radiation Oncology | Admitting: Radiation Oncology

## 2015-07-19 DIAGNOSIS — Z510 Encounter for antineoplastic radiation therapy: Secondary | ICD-10-CM | POA: Diagnosis not present

## 2015-07-20 ENCOUNTER — Ambulatory Visit
Admission: RE | Admit: 2015-07-20 | Discharge: 2015-07-20 | Disposition: A | Discharge status: Home / Self Care | Payer: BC Managed Care – PPO | Source: Ambulatory Visit | Attending: Radiation Oncology | Admitting: Radiation Oncology

## 2015-07-20 DIAGNOSIS — Z510 Encounter for antineoplastic radiation therapy: Secondary | ICD-10-CM | POA: Diagnosis not present

## 2015-07-21 ENCOUNTER — Ambulatory Visit
Admission: RE | Admit: 2015-07-21 | Discharge: 2015-07-21 | Disposition: A | Discharge status: Home / Self Care | Payer: BC Managed Care – PPO | Source: Ambulatory Visit | Attending: Radiation Oncology | Admitting: Radiation Oncology

## 2015-07-21 DIAGNOSIS — Z510 Encounter for antineoplastic radiation therapy: Secondary | ICD-10-CM | POA: Diagnosis not present

## 2015-07-23 ENCOUNTER — Ambulatory Visit: Payer: BC Managed Care – PPO

## 2015-07-24 ENCOUNTER — Ambulatory Visit
Admission: RE | Admit: 2015-07-24 | Discharge: 2015-07-24 | Disposition: A | Discharge status: Home / Self Care | Payer: BC Managed Care – PPO | Source: Ambulatory Visit | Attending: Radiation Oncology | Admitting: Radiation Oncology

## 2015-07-24 ENCOUNTER — Encounter: Payer: Self-pay | Admitting: Radiation Oncology

## 2015-07-24 ENCOUNTER — Other Ambulatory Visit: Payer: Self-pay

## 2015-07-24 ENCOUNTER — Encounter: Payer: Self-pay | Admitting: Internal Medicine

## 2015-07-24 ENCOUNTER — Other Ambulatory Visit: Payer: Self-pay | Admitting: Oncology

## 2015-07-24 ENCOUNTER — Ambulatory Visit (INDEPENDENT_AMBULATORY_CARE_PROVIDER_SITE_OTHER): Payer: BC Managed Care – PPO | Admitting: Internal Medicine

## 2015-07-24 ENCOUNTER — Telehealth: Payer: Self-pay | Admitting: *Deleted

## 2015-07-24 VITALS — BP 127/73 | HR 67 | Temp 98.2°F | Ht 62.0 in | Wt 216.7 lb

## 2015-07-24 VITALS — BP 118/72 | HR 70 | Ht 62.5 in | Wt 216.4 lb

## 2015-07-24 DIAGNOSIS — Z510 Encounter for antineoplastic radiation therapy: Secondary | ICD-10-CM | POA: Diagnosis not present

## 2015-07-24 DIAGNOSIS — I480 Paroxysmal atrial fibrillation: Secondary | ICD-10-CM

## 2015-07-24 DIAGNOSIS — I10 Essential (primary) hypertension: Secondary | ICD-10-CM

## 2015-07-24 DIAGNOSIS — G4733 Obstructive sleep apnea (adult) (pediatric): Secondary | ICD-10-CM | POA: Diagnosis not present

## 2015-07-24 DIAGNOSIS — C50411 Malignant neoplasm of upper-outer quadrant of right female breast: Secondary | ICD-10-CM

## 2015-07-24 MED ORDER — METOPROLOL TARTRATE 25 MG PO TABS: 25.0000 mg | ORAL_TABLET | Freq: Three times a day (TID) | ORAL | Status: DC | PRN

## 2015-07-24 MED ORDER — SOTALOL HCL 80 MG PO TABS: 80.0000 mg | ORAL_TABLET | Freq: Two times a day (BID) | ORAL | Status: DC

## 2015-07-24 NOTE — Patient Instructions (Signed)
Medication Instructions:  Your physician has recommended you make the following change in your medication:  1) Stop Metoprolol Succinate (Toprol XL)  2) Increase Sotalol to 80 mg (1 whole tablet) twice daily   Labwork: N/A  Testing/Procedures: N/A  Follow-Up: Your physician wants you to follow-up: EKG on Thursday at the AFIB clinic and in 2 months with Dr. Rayann Heman.   Any Other Special Instructions Will Be Listed Below (If Applicable).     If you need a refill on your cardiac medications before your next appointment, please call your pharmacy.

## 2015-07-24 NOTE — Progress Notes (Signed)
Courtney Dunlap presents for her 20th fraction of radiation to her Right Breast. She reports some fatigue, and his taking naps during the day and going to bed earlier at night. Her right anterior breast is red and very tender and she reports slightly swollen. She has a rash type area underneath her breast. She is using the radiaplex cream twice daily.   BP 127/73 mmHg  Pulse 67  Temp(Src) 98.2 F (36.8 C)  Ht 5\' 2"  (1.575 m)  Wt 216 lb 11.2 oz (98.294 kg)  BMI 39.62 kg/m2  LMP 11/21/2010   Wt Readings from Last 3 Encounters:  07/24/15 216 lb 11.2 oz (98.294 kg)  07/17/15 215 lb 6.4 oz (97.705 kg)  07/13/15 216 lb 6.4 oz (98.158 kg)

## 2015-07-24 NOTE — Progress Notes (Signed)
   Weekly Management Note:  Outpatient    ICD-9-CM ICD-10-CM   1. Breast cancer of upper-outer quadrant of right female breast (HCC) 174.4 C50.411     Current Dose: 40 Gy  Projected Dose: 50 Gy   Narrative:  The patient presents for routine under treatment assessment.  CBCT/MVCT images/Port film x-rays were reviewed.  The chart was checked.  Skin is pinker,   right breast more sore, but not severe   Physical Findings:  height is 5\' 2"  (1.575 m) and weight is 216 lb 11.2 oz (98.294 kg). Her temperature is 98.2 F (36.8 C). Her blood pressure is 127/73 and her pulse is 67.   Wt Readings from Last 3 Encounters:  07/24/15 216 lb 11.2 oz (98.294 kg)  07/17/15 215 lb 6.4 oz (97.705 kg)  07/13/15 216 lb 6.4 oz (98.158 kg)    Skin of right breast intact, moderately erythematous, expected warmth to touch from radiotherapy, no sign of infection  Impression:  The patient is tolerating radiotherapy.  Plan:  Continue radiotherapy as planned.  Use hydrocortisone 1% cream prn itching  ________________________________   Eppie Gibson, M.D.

## 2015-07-24 NOTE — Progress Notes (Signed)
Electrophysiology Office Note   Date:  07/24/2015   ID:  Courtney Dunlap, DOB [birth year], MRN [id number]  PCP:  Courtney Stack, MD  Cardiologist:  Dr Radford Pax Primary Electrophysiologist: Thompson Grayer, MD    Chief Complaint  Patient presents with  . PSVT     History of Present Illness: Courtney Dunlap is a 64 y.o. female who presents today for electrophysiology evaluation.   She presents today for EP consultation.  She initially presented in 2013 with atrial flutter and subsequently atrial fibrillation.  She was hyperthyroid at the time and was treated with medication under the guidance of Dr Forde Dandy.  She continued to have rare afib episodes and was placed on sotalol 9m BID.  Eventually, her arrhythmia improved and she was reduced to sotalol 439mBID (unclear reasons).  She did well until August of this year when she presented with SVT.  She was in the ER in August with an episode of SVT.  Per report, she has SVT at 220 bpm and was given adenosine by EMT with termination to sinus rhythm.  EKGs by ED physicians only document sinus rhythm.  She was seen by Dr TuRadford Pax/6/16 (her note reviewed) and started on toprol XL.  She wore an event monitor which was unrevealing. She did well until 11/29 when she had return of abrupt onset tachycardia with rate 223 bpm, documented by EMS. Adenosine did not terminate tachycardia and she was given cardizem 1016mV which converted her to sinus rhythm.  Today, she denies symptoms of palpitations, chest pain, shortness of breath, orthopnea, PND, lower extremity edema, claudication, dizziness, presyncope, syncope, bleeding, or neurologic sequela. The patient is tolerating medications without difficulties and is otherwise without complaint today.    Past Medical History  Diagnosis Date  . [place] ([place]   PAF 08/19/11 admitted due to AFIB with RVR  . Hypertension   . Hypercholesteremia   . GERD (gastroesophageal reflux disease)   . MI (myocardial  infarction) ([place]/13  . Obesity   . Hiatal hernia     with GERD  . Shingles   . Mild cognitive impairment with memory loss   . Allergic dermatitis   . Lipoma     of the brain  . Anxiety   . Depression   . Family history of breast cancer   . Sleep apnea     severe with AHI 37/hr now on CPAP at 13cm H2O  . Shortness of breath dyspnea   . Hyperthyroidism     pt. reports that she thinks she has hypothyroidism, [place] in the past 1-2 yrs.   . Arthritis     back, knees & ankles   . Cancer ([place]   skin- lower abdomen  . Breast cancer ([place] . Morbid obesity ([place] . [facility]ricular tachycardia) [facility]  Past Surgical History  Procedure Laterality Date  . Tubal ligation    . Tonsillectomy    . Dilation and curettage of uterus  1996     Polyp resection   . Hysteroscopy  4/12    D&C  . Appendectomy  28 years ago  . Radioactive seed guided mastectomy with axillary sentinel lymph node biopsy Right 05/18/2015    Procedure: RIGHT BREAST RADIOACTIVE SEED PARTIAL MASTECTOMY WITH RIGHT SENTINEL LYMPH NODE MAPPING;  Surgeon: ThoErroll LunaD;  [facility];  Laterality: Right;     Current Outpatient  Prescriptions  Medication Sig Dispense Refill  . acetaminophen (TYLENOL) 500 MG tablet Take 500 mg by mouth every 6 (six) hours as needed for pain.    [name] atorvastatin (LIPITOR) 20 MG tablet Take 20 mg by mouth daily after breakfast.     . diclofenac sodium (VOLTAREN) 1 % GEL Apply 2 g topically as needed (FOR KNEES).     . hydrochlorothiazide (MICROZIDE) 12.5 MG capsule Take 1 capsule (12.5 mg total) by mouth daily. 90 capsule 3  . Omega-3 Fatty Acids (FISH OIL) 1000 MG CAPS Take 1,000 mg by mouth daily.    . ranitidine (ZANTAC) 150 MG tablet Take 150 mg by mouth 2 (two) times daily as needed for heartburn.    . sotalol (BETAPACE) 80 MG tablet Take 80 mg by mouth 2 (two) times daily.    . traMADol (ULTRAM) 50 MG tablet Take 1  tablet (50 mg total) by mouth every 6 (six) hours as needed for moderate pain. 30 tablet 0  . vitamin B-12 (CYANOCOBALAMIN) 1000 MCG tablet Take 1,000 mcg by mouth daily.    . Vitamin D, Ergocalciferol, (DRISDOL) 50000 UNITS CAPS capsule Take 50,000 Units by mouth every 7 (seven) days. On Saturday    . warfarin (COUMADIN) 5 MG tablet Take 7.5 mg by mouth daily.     [name] anastrozole (ARIMIDEX) 1 MG tablet Take 1 tablet (1 mg total) by mouth daily. (Patient not taking: Reported on 07/11/2015) 90 tablet 4  . metoprolol (LOPRESSOR) 25 MG tablet Take 1 tablet (25 mg total) by mouth 3 (three) times daily as needed (for palpations and heart rate greater than 140 per minute). (Patient not taking: Reported on 07/24/2015) 30 tablet 0   No current facility-administered medications for this visit.    Allergies:   Codeine; Adhesive; and Latex   Social History:  The patient  reports that she has quit smoking. Her smoking use included Cigarettes. She quit after 4 years of use. She quit smokeless tobacco use about 41 years ago. She reports that she does not drink alcohol or use illicit drugs.   Family History:  The patient's  family history includes Brain cancer in her mother; Breast cancer in her paternal aunt and paternal aunt; Diabetes in her maternal grandmother; Heart attack in her maternal grandmother; Hypertension in her brother, mother, sister, and sister; Melanoma in her paternal aunt; Multiple myeloma in her paternal aunt; Other (age of onset: 76) in her daughter; Thyroid disease in her brother and sister.    ROS:  Please see the history of present illness.   All other systems are reviewed and negative.    PHYSICAL EXAM: VS:  BP 118/72 mmHg  Pulse 70  Ht 5' 2.5" (1.588 m)  Wt 216 lb 6.4 oz (98.158 kg)  BMI 38.92 kg/m2  LMP 11/21/2010 , BMI Body mass index is 38.92 kg/(m^2). GEN: Well nourished, well developed, in no acute distress HEENT: normal Neck: no JVD, carotid bruits, or masses Cardiac:  RRR; no murmurs, rubs, or gallops,no edema  Respiratory:  clear to auscultation bilaterally, normal work of breathing GI: soft, nontender, nondistended, + BS MS: no deformity or atrophy Skin: warm and dry  Neuro:  Strength and sensation are intact Psych: euthymic mood, full affect  EKG:  EKG is ordered today. The ekg ordered today shows sinus rhythm 70 bpm, PR 136 msec, QRS 82 msec, Qtc 388 msec.   Recent Labs: 07/04/2015: ALT 22 07/11/2015: BUN 17; Creatinine, Ser 0.96; Hemoglobin 13.1; Platelets 202; Potassium 3.6; Sodium  137    Lipid Panel  No results found for: CHOL, TRIG, HDL, CHOLHDL, VLDL, LDLCALC, LDLDIRECT   Wt Readings from Last 3 Encounters:  07/24/15 216 lb 6.4 oz (98.158 kg)  07/24/15 216 lb 11.2 oz (98.294 kg)  07/17/15 215 lb 6.4 oz (97.705 kg)      Other studies Reviewed: Additional studies/ records that were reviewed today include: Dr Landis Gandy notes, Kerby Less noted, EMS strips from 07/11/15  Review of the above records today demonstrates: SVT at 230 bpm.  With adenosine, tachycardia appears to be an atrial flutter with 1:1 conduction.  There is RBB aberrancy at times.  With adenosine, atrial flutter is revealed and is probably typical.   ASSESSMENT AND PLAN:  1.  SVT, atrial fibrillation, and atrial flutter Multiple atrial arrhythmias. Therapeutic strategies for her multiple arrhythmias including medicine and ablation were discussed in detail with the patient today.  I discussed increased sotalol, switching to flecainide, or ablation.  Risk, benefits, and alternatives to EP study and radiofrequency ablation for afib were also discussed in detail today.  At this time, she would prefer to increase sotalol and follow.  Given her multiple arrhythmias I would favor this approach also. Stop metoprolol and increase sotalol to 63m BID.  She will followup in AF clinic in 48-72 hours for repeat EKG.  Return to see me in 2 months for additional conversations. The  importance of lifestyle modifications was stressed today.  On coumadin  2. Obesity Weight loss encouraged Body mass index is 38.92 kg/(m^2).  3. OSA Compliance with CPAP encouraged  4.  HTN Stable No change required today  Current medicines are reviewed at length with the patient today.   The patient does not have concerns regarding her medicines.  The following changes were made today:  none  Signed, JThompson Grayer MD  07/24/2015 5:24 PM     [id number]([phone](office) ([phone](fax)

## 2015-07-24 NOTE — Progress Notes (Unsigned)
DEXA scan at [facility] obtained 05/30/2015 was normal with the lowest T score at -0.9

## 2015-07-24 NOTE — Telephone Encounter (Signed)
Courtney Dunlap came into the office today and stated that Dr. Radford Pax had said something a while back about her getting a new CPAP machine.  Her insurance deductible is currently met, and if possible she would like to get this done by the end of the year.   I have routed this request to Dr. Radford Pax to advise.

## 2015-07-25 ENCOUNTER — Ambulatory Visit
Admission: RE | Admit: 2015-07-25 | Discharge: 2015-07-25 | Disposition: A | Discharge status: Home / Self Care | Payer: BC Managed Care – PPO | Source: Ambulatory Visit | Attending: Radiation Oncology | Admitting: Radiation Oncology

## 2015-07-25 DIAGNOSIS — Z510 Encounter for antineoplastic radiation therapy: Secondary | ICD-10-CM | POA: Diagnosis not present

## 2015-07-26 ENCOUNTER — Ambulatory Visit
Admission: RE | Admit: 2015-07-26 | Discharge: 2015-07-26 | Disposition: A | Discharge status: Home / Self Care | Payer: BC Managed Care – PPO | Source: Ambulatory Visit | Attending: Radiation Oncology | Admitting: Radiation Oncology

## 2015-07-26 DIAGNOSIS — Z510 Encounter for antineoplastic radiation therapy: Secondary | ICD-10-CM | POA: Diagnosis not present

## 2015-07-27 ENCOUNTER — Ambulatory Visit
Admission: RE | Admit: 2015-07-27 | Discharge: 2015-07-27 | Disposition: A | Discharge status: Home / Self Care | Payer: BC Managed Care – PPO | Source: Ambulatory Visit | Attending: Radiation Oncology | Admitting: Radiation Oncology

## 2015-07-27 ENCOUNTER — Ambulatory Visit (HOSPITAL_COMMUNITY)
Admission: RE | Admit: 2015-07-27 | Discharge: 2015-07-27 | Disposition: A | Discharge status: Home / Self Care | Payer: BC Managed Care – PPO | Source: Ambulatory Visit | Attending: Nurse Practitioner | Admitting: Nurse Practitioner

## 2015-07-27 DIAGNOSIS — Z510 Encounter for antineoplastic radiation therapy: Secondary | ICD-10-CM | POA: Diagnosis not present

## 2015-07-27 DIAGNOSIS — I4891 Unspecified atrial fibrillation: Secondary | ICD-10-CM | POA: Diagnosis not present

## 2015-07-27 DIAGNOSIS — I480 Paroxysmal atrial fibrillation: Secondary | ICD-10-CM | POA: Diagnosis not present

## 2015-07-27 NOTE — Progress Notes (Addendum)
Pt here for ekg only after increasing sotalol dose. Roderic Palau NP to review EKG  Intervals/rate reviewed and wnl on increased dose of sotalol 80 mg bid.

## 2015-07-28 ENCOUNTER — Ambulatory Visit: Payer: BC Managed Care – PPO

## 2015-07-28 DIAGNOSIS — Z510 Encounter for antineoplastic radiation therapy: Secondary | ICD-10-CM | POA: Diagnosis not present

## 2015-07-31 ENCOUNTER — Telehealth: Payer: Self-pay | Admitting: *Deleted

## 2015-07-31 ENCOUNTER — Ambulatory Visit
Admission: RE | Admit: 2015-07-31 | Discharge: 2015-07-31 | Disposition: A | Discharge status: Home / Self Care | Payer: BC Managed Care – PPO | Source: Ambulatory Visit | Attending: Radiation Oncology | Admitting: Radiation Oncology

## 2015-07-31 ENCOUNTER — Ambulatory Visit: Payer: BC Managed Care – PPO

## 2015-07-31 ENCOUNTER — Encounter: Payer: Self-pay | Admitting: Radiation Oncology

## 2015-07-31 VITALS — BP 138/73 | HR 54 | Temp 97.8°F | Ht 62.5 in | Wt 217.1 lb

## 2015-07-31 DIAGNOSIS — C50411 Malignant neoplasm of upper-outer quadrant of right female breast: Secondary | ICD-10-CM | POA: Insufficient documentation

## 2015-07-31 DIAGNOSIS — Z510 Encounter for antineoplastic radiation therapy: Secondary | ICD-10-CM | POA: Diagnosis not present

## 2015-07-31 MED ORDER — RADIAPLEXRX EX GEL
Freq: Once | CUTANEOUS | Status: AC
  Administered 2015-07-31: 13:00:00 via TOPICAL

## 2015-07-31 NOTE — Progress Notes (Signed)
   Weekly Management Note  Outpatient    ICD-9-CM ICD-10-CM   1. Breast cancer of upper-outer quadrant of right female breast (HCC) 174.4 C50.411 hyaluronate sodium (RADIAPLEXRX) gel    Completed Radiotherapy. Total Dose:  50Gy   Narrative:  The patient presents for routine under treatment assessment on last day of radiotherapy.  CBCT/MVCT images/Port film x-rays were reviewed.  The chart was checked. Doing well  Physical Findings:  height is 5' 2.5" (1.588 m) and weight is 217 lb 1.6 oz (98.476 kg). Her temperature is 97.8 F (36.6 C). Her blood pressure is 138/73 and her pulse is 54.   Bright erythema over right breast, skin intact  Impression:  The patient has tolerated radiotherapy.  Plan:  Routine follow-up in one month. Neosporin PRN peeling of skin  ________________________________   Eppie Gibson, M.D.

## 2015-07-31 NOTE — Progress Notes (Signed)
Ms. Martinique is here for her last fraction of radiation to her Right Breast. She reports some mild fatigue, especially is the afternoons. Her Right breast has hyperpigmentation and redness. The area under her breast is moist, but no open areas are observed. She does report some mild itching. I will provide her with some radiaplex cream to use.  BP 138/73 mmHg  Pulse 54  Temp(Src) 97.8 F (36.6 C)  Ht 5' 2.5" (1.588 m)  Wt 217 lb 1.6 oz (98.476 kg)  BMI 39.05 kg/m2  LMP 11/21/2010   Wt Readings from Last 3 Encounters:  07/31/15 217 lb 1.6 oz (98.476 kg)  07/24/15 216 lb 6.4 oz (98.158 kg)  07/24/15 216 lb 11.2 oz (98.294 kg)

## 2015-07-31 NOTE — Telephone Encounter (Signed)
Left message for a return phone call to follow up after radiation completion.  

## 2015-08-02 ENCOUNTER — Other Ambulatory Visit: Payer: Self-pay | Admitting: *Deleted

## 2015-08-02 MED ORDER — METOPROLOL TARTRATE 25 MG PO TABS: 25.0000 mg | ORAL_TABLET | Freq: Three times a day (TID) | ORAL | Status: DC | PRN

## 2015-08-03 ENCOUNTER — Encounter: Payer: Self-pay | Admitting: *Deleted

## 2015-08-03 NOTE — Progress Notes (Signed)
Pt called regarding a "blister" on the right breast.  She reports she has other blisters but this is a new one.  She reports it is on the right breast, left side of breast. Area approximately the size of a quarter.

## 2015-08-04 ENCOUNTER — Other Ambulatory Visit: Payer: Self-pay | Admitting: Adult Health

## 2015-08-04 DIAGNOSIS — C50411 Malignant neoplasm of upper-outer quadrant of right female breast: Secondary | ICD-10-CM

## 2015-08-12 ENCOUNTER — Telehealth: Payer: Self-pay | Admitting: Oncology

## 2015-08-12 NOTE — Telephone Encounter (Signed)
Appointments made,calendars and survivorship sheet mailed to patient

## 2015-08-22 ENCOUNTER — Encounter (HOSPITAL_COMMUNITY): Payer: Self-pay

## 2015-08-29 ENCOUNTER — Encounter: Payer: Self-pay | Admitting: Oncology

## 2015-08-29 NOTE — Progress Notes (Signed)
Placed [employer] form on desk for dr. Jana Hakim to sign

## 2015-09-01 ENCOUNTER — Telehealth: Payer: Self-pay | Admitting: *Deleted

## 2015-09-01 ENCOUNTER — Encounter: Payer: Self-pay | Admitting: Radiation Oncology

## 2015-09-01 ENCOUNTER — Ambulatory Visit
Admission: RE | Admit: 2015-09-01 | Discharge: 2015-09-01 | Disposition: A | Discharge status: Home / Self Care | Payer: BC Managed Care – PPO | Source: Ambulatory Visit | Attending: Radiation Oncology | Admitting: Radiation Oncology

## 2015-09-01 VITALS — BP 126/68 | HR 68 | Temp 98.3°F | Wt 217.4 lb

## 2015-09-01 DIAGNOSIS — C50411 Malignant neoplasm of upper-outer quadrant of right female breast: Secondary | ICD-10-CM

## 2015-09-01 NOTE — Progress Notes (Signed)
Ms. Courtney Dunlap is here for follow up of radiation completed 07/31/15 to her Right Breast. She reports her fatigue improved recently, but states she is still tired occasionally at the end of the day. She is attending a [employer] class at her [employer], and is enjoying that very much. She does complain of pain in her Right breast. She says at times it is a 6/10. She says it is a dull/ aching pain. Her breast is tender to touch, and swollen per her report. She reports she did just begin to wear a sports bra this past Monday, which is when her pain became worse (she had actually improved after treatment). She says she still has "shooting pains" over breast area occasionally most recently yesterday.  Her breast is slightly red in appearance, and there is a small old peeling area present underneath her breast. She is still using the radiaplex cream twice daily. She has a question regarding lifting and any restrictions she may have because of her treatment.   BP 126/68 mmHg  Pulse 68  Temp(Src) 98.3 F (36.8 C)  Wt 217 lb 6.4 oz (98.612 kg)  LMP 11/21/2010   Wt Readings from Last 3 Encounters:  09/01/15 217 lb 6.4 oz (98.612 kg)  07/31/15 217 lb 1.6 oz (98.476 kg)  07/24/15 216 lb 6.4 oz (98.158 kg)

## 2015-09-01 NOTE — Telephone Encounter (Signed)
Called dr. Brantley Stage office [phone] ith Sunday Spillers asking that patient be seen by Dr. Brantley Stage or associate to see patient , she has  Swelling a seroma cavity right breast, , no fever, does have erythema less than  A week presenting, could abx or aspirate for this? "as Dr. Brantley Stage is going to surgery in next few minutes,she will ask him what should be done for this patient;"His Partner Dr. Excell Seltzer  Can see her as he has a cancellation, will let patient know  What time, thnaked Sunday Spillers   Be at office today at 345pm 12:30 PM

## 2015-09-01 NOTE — Progress Notes (Signed)
Radiation Oncology         [phone] ________________________________  Name: Courtney Dunlap MRN: [id number]  Date: 09/01/2015  DOB: [birth year]  Follow-Up Visit Note  Outpatient  CC: [name], MD  Magrinat, Virgie Dad, MD  Diagnosis and Prior Radiotherapy:    ICD-9-CM ICD-10-CM   1. 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  Narrative:  The patient returns today for routine follow-up.  She has noted new tenderness and swelling at the lumpectomy site for 5 days. No fevers. Possibly increased right breast erythema. Otherwise, well. Doing the [place] program at the [facility].                       ALLERGIES:  is allergic to codeine; adhesive; and latex.  Meds: Current Outpatient Prescriptions  Medication Sig Dispense Refill  . acetaminophen (TYLENOL) 500 MG tablet Take 500 mg by mouth every 6 (six) hours as needed for pain.    [name] anastrozole (ARIMIDEX) 1 MG tablet Take 1 tablet (1 mg total) by mouth daily. 90 tablet 4  . atorvastatin (LIPITOR) 20 MG tablet Take 20 mg by mouth daily after breakfast.     . diclofenac sodium (VOLTAREN) 1 % GEL Apply 2 g topically as needed (FOR KNEES).     . hydrochlorothiazide (MICROZIDE) 12.5 MG capsule Take 1 capsule (12.5 mg total) by mouth daily. 90 capsule 3  . metoprolol tartrate (LOPRESSOR) 25 MG tablet Take 1 tablet (25 mg total) by mouth 3 (three) times daily as needed (for palpations and heart rate greater than 140 per minute). 45 tablet 3  . Omega-3 Fatty Acids (FISH OIL) 1000 MG CAPS Take 1,000 mg by mouth daily.    . sotalol (BETAPACE) 80 MG tablet Take 1 tablet (80 mg total) by mouth 2 (two) times daily. 90 tablet 3  . traMADol (ULTRAM) 50 MG tablet Take 1 tablet (50 mg total) by mouth every 6 (six) hours as needed for moderate pain. 30 tablet 0  . vitamin  B-12 (CYANOCOBALAMIN) 1000 MCG tablet Take 1,000 mcg by mouth daily.    . Vitamin D, Ergocalciferol, (DRISDOL) 50000 UNITS CAPS capsule Take 50,000 Units by mouth every 7 (seven) days. On Saturday    . warfarin (COUMADIN) 5 MG tablet Take 7.5 mg by mouth daily.     . ranitidine (ZANTAC) 150 MG tablet Take 150 mg by mouth 2 (two) times daily as needed for heartburn.     No current facility-administered medications for this encounter.    Physical Findings: The patient is in no acute distress. Patient is alert and oriented.  weight is 217 lb 6.4 oz (98.612 kg). Her temperature is 98.3 F (36.8 C). Her blood pressure is 126/68 and her pulse is 68. .   Right breast - mild erythema.  No obvious warmth.  Seroma at lumpectomy site is tender.   Lab Findings: Lab Results  Component Value Date   WBC 7.8 07/11/2015   HGB 13.1 07/11/2015   HCT 40.1 07/11/2015   MCV 93.3 07/11/2015   PLT 202 07/11/2015    Radiographic Findings: No results found.  Impression/Plan:  Dr. Excell Seltzer graciously agreed to see patient today due to her breast changes, possibly infected seroma.  I encouraged her to continue with anti-estrogen pill, yearly mammography and followup with medical  oncology. I will see her back on an as-needed basis. I have encouraged her to call if she has any issues or concerns in the future. I wished her the very best.  _____________________________________   Eppie Gibson, MD    This document serves as a record of services personally performed by Eppie Gibson, MD. It was created on her behalf by Lendon Collar, a trained medical scribe. The creation of this record is based on the scribe's personal observations and the provider's statements to them. This document has been checked and approved by the attending provider.

## 2015-09-04 ENCOUNTER — Other Ambulatory Visit: Payer: Self-pay | Admitting: Internal Medicine

## 2015-09-04 ENCOUNTER — Telehealth: Payer: Self-pay | Admitting: Internal Medicine

## 2015-09-04 MED ORDER — SOTALOL HCL 80 MG PO TABS: 80.0000 mg | ORAL_TABLET | Freq: Two times a day (BID) | ORAL | Status: DC

## 2015-09-04 NOTE — Telephone Encounter (Signed)
Pt's Rx sent to pt pharmacy as requested. Confirmation received.

## 2015-09-04 NOTE — Telephone Encounter (Signed)
°*  STAT* If patient is at the pharmacy, call can be transferred to refill team.   1. Which medications need to be refilled? (please list name of each medication and dose if known) Sotalol 80mg    2. Which pharmacy/location (including street and city if local pharmacy) is medication to be sent to?[facility]  3. Do they need a 30 day or 90 day supply? 17   Pt stated the prescription that was sent was wrong and she need a prescription for 90 day supply and pt takes 2pills a day which equal to 180 pills. Please advise.

## 2015-09-04 NOTE — Telephone Encounter (Signed)
Pt's Rx was sent to pt pharmacy as requested with correction. Confirmation received.

## 2015-09-04 NOTE — Telephone Encounter (Signed)
°*  STAT* If patient is at the pharmacy, call can be transferred to refill team.   1. Which medications need to be refilled? (please list name of each medication and dose if known) Sotalol 80mg   2. Which pharmacy/location (including street and city if local pharmacy) is medication to be sent to?[facility]  3. Do they need a 30 day or 90 day supply? 90day supply   Pt need new prescription.

## 2015-09-11 NOTE — Progress Notes (Deleted)
[facility] End of Treatment Note  Name:Courtney Dunlap  Date: 07/30/2016 QKS:[id number] DOB:08-17-1950     CC: [name], MD  No ref. 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                        PLAN: Routine followup in one month. Patient instructed to call if questions or worsening complaints in interim.  -----------------------------------  Eppie Gibson, MD

## 2015-09-11 NOTE — Progress Notes (Signed)
[facility] End of Treatment Note  Name:Courtney Dunlap  Date: 07/31/2015 UDJ:[id number] DOB:August 08, 1951     CC: [name], MD  No ref. 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                        PLAN: Routine followup in one month. Patient instructed to call if questions or worsening complaints in interim.  -----------------------------------  Eppie Gibson, MD

## 2015-09-28 ENCOUNTER — Ambulatory Visit (HOSPITAL_BASED_OUTPATIENT_CLINIC_OR_DEPARTMENT_OTHER): Payer: BC Managed Care – PPO | Admitting: Oncology

## 2015-09-28 ENCOUNTER — Telehealth: Payer: Self-pay | Admitting: Oncology

## 2015-09-28 VITALS — BP 134/64 | HR 64 | Temp 98.0°F | Resp 18 | Ht 62.5 in | Wt 212.4 lb

## 2015-09-28 DIAGNOSIS — C50811 Malignant neoplasm of overlapping sites of right female breast: Secondary | ICD-10-CM | POA: Diagnosis not present

## 2015-09-28 DIAGNOSIS — Z170 Estrogen receptor positive status [ER+]: Secondary | ICD-10-CM

## 2015-09-28 DIAGNOSIS — C50411 Malignant neoplasm of upper-outer quadrant of right female breast: Secondary | ICD-10-CM

## 2015-09-28 NOTE — Telephone Encounter (Signed)
appt made and avs printed °

## 2015-09-28 NOTE — Progress Notes (Signed)
[place]  Telephone:([phone]) [phone] [phone]) [phone]     ID: Courtney Dunlap DOB: 29-Mar-1951  MR#: [id number]  TRR#:[id number]  Patient Care Team: Reynold Bowen, MD as PCP - General (Endocrinology) Wenda Low, MD as Consulting Physician (Internal Medicine) Sueanne Margarita, MD as Consulting Physician (Cardiology) Erroll Luna, MD as Consulting Physician (General Surgery) Chauncey Cruel, MD as Consulting Physician (Oncology) Eppie Gibson, MD as Attending Physician (Radiation Oncology) Rockwell Germany, RN as Registered Nurse Mauro Kaufmann, RN as Registered Nurse Megan Salon, MD as Consulting Physician (Gynecology) Sylvan Cheese, NP as Nurse Practitioner (Nurse Practitioner) PCP: [name], MD OTHER MD: Christene Slates M.D.  CHIEF COMPLAINT: Estrogen receptor positive breast cancer  CURRENT TREATMENT: anastrozole   BREAST CANCER HISTORY: From the original intake note:  Courtney Dunlap had routine screening mammography at [facility] 04/11/2015 showing a new irregular lesion in the right breast. Diagnostic right unilateral mammogram and ultrasound 04/13/2015 found the breast density to be category B. In the right breast at the 9:00 position there was a mass with indistinct margins associated with architectural distortion. By ultrasound this was a 1 cm. Ultrasound of the axilla showed no abnormality.  Biopsy of the right breast mass in question 04/18/2015 showed ([id number]) and invasive ductal carcinoma, grade 2, estrogen receptor and progesterone receptor both 100% positive, with strong staining intensity, with an MIB-1 of 3%, and no HER-2 amplification, the signals ratio being 1.59 and the number per cell 2.30.  The patient's subsequent history is as detailed below.   INTERVAL HISTORY: Desire returns today for follow-up of her estrogen receptor positive breast cancer. She started anastrozole 08/13/2015. She is doing terrific so far. She has had no  problems with hot flashes, vaginal dryness, or arthralgias myalgias. She is obtaining at at less than $20 for 3 month supply.  REVIEW OF SYSTEMS: She she does have some aches and pains here and there but these are not new and they're not increased in intensity or frequency. She ended up in the emergency room in November because of palpitations. She has had extensive workup for this under Dr. Radford Pax and is on a beta blocker as well as warfarin. She has a little bit of a rash in her external ear and she is using a steroid cream for that with some success. The best news is that she joined [employer] and is really loving. Aside from that she walks about a mile most days. She is also improved her nutrition. She is appropriately proud of these lifestyle changes that she has made. Finally she has had some reaccumulation of fluid in the right breast. She has had them drained on more than one occasion and she thinks she may need to have that done again next week under Dr. Paula Libra. Aside from these issues a detailed review of systems today was stable . PAST MEDICAL HISTORY: Past Medical History  Diagnosis Date  . A-fib ([place])     PAF 08/19/11 admitted due to AFIB with RVR  . Hypertension   . Hypercholesteremia   . GERD (gastroesophageal reflux disease)   . Elevated troponin 1/13    in setting of AF with RVR  . Obesity   . Hiatal hernia     with GERD  . Shingles   . Mild cognitive impairment with memory loss   . Allergic dermatitis   . Lipoma     of the brain  . Anxiety   . Depression   . Family history of  breast cancer   . Sleep apnea     severe with AHI 37/hr now on CPAP at 13cm H2O  . Shortness of breath dyspnea   . Hyperthyroidism     pt. reports that she thinks she has hypothyroidism, states MD- [place] took her off med. in the past 1-2 yrs.   . Arthritis     back, knees & ankles   . Cancer ([place])     skin- lower abdomen  . Breast cancer ([place])   . Morbid obesity ([place])   . SVT (supraventricular  tachycardia) ([place])     PAST SURGICAL HISTORY: Past Surgical History  Procedure Laterality Date  . Tubal ligation    . Tonsillectomy    . Dilation and curettage of uterus  1996     Polyp resection   . Hysteroscopy  4/12    D&C  . Appendectomy  28 years ago  . Radioactive seed guided mastectomy with axillary sentinel lymph node biopsy Right 05/18/2015    Procedure: RIGHT BREAST RADIOACTIVE SEED PARTIAL MASTECTOMY WITH RIGHT SENTINEL LYMPH NODE MAPPING;  Surgeon: Erroll Luna, MD;  Location: [facility];  Service: General;  Laterality: Right;    FAMILY HISTORY Family History  Problem Relation Age of Onset  . Hypertension Mother   . Brain cancer Mother   . Hypertension Sister   . Thyroid disease Sister   . Hypertension Sister   . Hypertension Brother   . Thyroid disease Brother   . Diabetes Maternal Grandmother   . Heart attack Maternal Grandmother   . Breast cancer Paternal Aunt     dx in her late 14s- early 84s  . Melanoma Paternal Aunt   . Breast cancer Paternal Aunt   . Multiple myeloma Paternal Aunt   . Other Daughter 37    ITP   the patient's father was diagnosed with squamous cell cancer of the throat at age 28 and died 49 years later. The patient's mother died from end-stage renal disease at age 38. The patient had one brother, 2 sisters. There is no cancer in the immediate family except as noted, but one of the patient's father's 5 sisters was diagnosed with breast cancer before the age of 69. Another 1 had multiple myeloma. On the patient's mother sides there is a history of brain tumor in a niece.   GYNECOLOGIC HISTORY:  Patient's last menstrual period was 11/21/2010. Menarche age 46, first live birth age 61. The patient is GX P2. She went through menopause in 2002 or thereabouts. She did not take hormone replacement. 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 Her husband Courtney Dunlap is a retired Clinical biochemist. Daughter Courtney Dunlap lives in [place] and is a housewife. Daughter "[name]" and Dunlap this in [place]. 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 The patient has 2 grandchildren and 3 great-grandchildren. She is a Psychologist, forensic.    ADVANCED DIRECTIVES: Not in place   HEALTH MAINTENANCE: Social History  Substance Use Topics  . Smoking status: Former Smoker -- 4 years    Types: Cigarettes  . Smokeless tobacco: Former Systems developer    Quit date: 05/15/1974     Comment: quit 35-40 years ago  . Alcohol Use: No     Colonoscopy: ?1997  PAP: FEB 2016  Bone density: October 2016 at [facility], normal  Lipid panel:  Allergies  Allergen Reactions  . Codeine Swelling    Facial/lips  . Adhesive [Tape] Itching and Rash    MUST USE PAPER TAPE-CAUSES BLISTERS AND BRUISES ALSO  . Latex Itching and Rash    Current Outpatient Prescriptions  Medication Sig Dispense Refill  . acetaminophen (TYLENOL) 500 MG tablet Take 500 mg by mouth every 6 (six) hours as needed for pain.    [name] anastrozole (ARIMIDEX) 1 MG tablet Take 1 tablet (1 mg total) by mouth daily. 90 tablet 4  . atorvastatin (LIPITOR) 20 MG tablet Take 20 mg by mouth daily after breakfast.     . diclofenac sodium (VOLTAREN) 1 % GEL Apply 2 g topically as needed (FOR KNEES).     . hydrochlorothiazide (MICROZIDE) 12.5 MG capsule Take 1 capsule (12.5 mg total) by mouth daily. 90 capsule 3  . metoprolol tartrate (LOPRESSOR) 25 MG tablet Take 1 tablet (25 mg total) by mouth 3 (three) times daily as needed (for palpations and heart rate greater than 140 per minute). 45 tablet 3  . Omega-3 Fatty Acids (FISH OIL) 1000 MG CAPS Take 1,000 mg by mouth daily.    . ranitidine (ZANTAC) 150 MG tablet Take 150 mg by mouth 2 (two) times daily as needed for heartburn.    . sotalol (BETAPACE) 80 MG tablet Take 1 tablet (80 mg  total) by mouth 2 (two) times daily. 180 tablet 3  . traMADol (ULTRAM) 50 MG tablet Take 1 tablet (50 mg total) by mouth every 6 (six) hours as needed for moderate pain. 30 tablet 0  . vitamin B-12 (CYANOCOBALAMIN) 1000 MCG tablet Take 1,000 mcg by mouth daily.    . Vitamin D, Ergocalciferol, (DRISDOL) 50000 UNITS CAPS capsule Take 50,000 Units by mouth every 7 (seven) days. On Saturday    . warfarin (COUMADIN) 5 MG tablet Take 7.5 mg by mouth daily.      No current facility-administered medications for this visit.    OBJECTIVE: Middle-aged white woman who appears stated age 65 Vitals:   09/28/15 1533  BP: 134/64  Pulse: 64  Temp: 98 F (36.7 C)  Resp: 18     Body mass index is 38.21 kg/(m^2).    ECOG FS:0 - Asymptomatic  Sclerae unicteric, pupils round and equal Oropharynx clear and moist-- no thrush or other lesions No cervical or supraclavicular adenopathy Lungs no rales or rhonchi Heart regular rate and rhythm Abd soft, nontender, positive bowel sounds MSK no focal spinal tenderness, no upper extremity lymphedema Neuro: nonfocal, well oriented, appropriate affect Breasts: The right breast is status post lumpectomy and radiation. There is some skin edema and hyperpigmentation related to the radiation, as expected also in the upper outer quadrant there is what is most consistent with a fluid accumulation. There is no erythema associated with this. The right axilla is benign. Left breast is unremarkable.     LAB RESULTS:  CMP     Component Value Date/Time   NA 137 07/11/2015 2116   NA 140 07/04/2015 1206   K 3.6 07/11/2015 2116   K 3.9 07/04/2015 1206   CL 103 07/11/2015 2116   CO2 26 07/11/2015 2116   CO2 28 07/04/2015 1206   GLUCOSE 143* 07/11/2015 2116   GLUCOSE 86 07/04/2015 1206   BUN 17 07/11/2015 2116   BUN 16.0 07/04/2015 1206   CREATININE 0.96 07/11/2015 2116   CREATININE 0.8 07/04/2015 1206   CALCIUM 8.9 07/11/2015 2116   CALCIUM 9.3 07/04/2015 1206    PROT 7.0 07/04/2015 1206  PROT 6.9 05/16/2015 1547   ALBUMIN 3.6 07/04/2015 1206   ALBUMIN 3.7 05/16/2015 1547   AST 22 07/04/2015 1206   AST 33 05/16/2015 1547   ALT 22 07/04/2015 1206   ALT 39 05/16/2015 1547   ALKPHOS 130 07/04/2015 1206   ALKPHOS 102 05/16/2015 1547   BILITOT 0.72 07/04/2015 1206   BILITOT 0.8 05/16/2015 1547   GFRNONAA >60 07/11/2015 2116   GFRAA >60 07/11/2015 2116    INo results found for: SPEP, UPEP  Lab Results  Component Value Date   WBC 7.8 07/11/2015   NEUTROABS 4.4 07/04/2015   HGB 13.1 07/11/2015   HCT 40.1 07/11/2015   MCV 93.3 07/11/2015   PLT 202 07/11/2015      Chemistry      Component Value Date/Time   NA 137 07/11/2015 2116   NA 140 07/04/2015 1206   K 3.6 07/11/2015 2116   K 3.9 07/04/2015 1206   CL 103 07/11/2015 2116   CO2 26 07/11/2015 2116   CO2 28 07/04/2015 1206   BUN 17 07/11/2015 2116   BUN 16.0 07/04/2015 1206   CREATININE 0.96 07/11/2015 2116   CREATININE 0.8 07/04/2015 1206      Component Value Date/Time   CALCIUM 8.9 07/11/2015 2116   CALCIUM 9.3 07/04/2015 1206   ALKPHOS 130 07/04/2015 1206   ALKPHOS 102 05/16/2015 1547   AST 22 07/04/2015 1206   AST 33 05/16/2015 1547   ALT 22 07/04/2015 1206   ALT 39 05/16/2015 1547   BILITOT 0.72 07/04/2015 1206   BILITOT 0.8 05/16/2015 1547       No results found for: LABCA2  No components found for: LABCA125  No results for input(s): INR in the last 168 hours.  Urinalysis    Component Value Date/Time   BILIRUBINUR n 09/30/2014 0911   PROTEINUR n 09/30/2014 0911   UROBILINOGEN negative 09/30/2014 0911   NITRITE n 09/30/2014 0911   LEUKOCYTESUR Negative 09/30/2014 0911    STUDIES: CLINICAL DATA: 65 year old female with chest pain and shortness of breath  EXAM: CHEST 2 VIEW  COMPARISON: Radiograph dated 12/03/2014  FINDINGS: Evaluation is limited due to overlying support lines and devices.  Two views of the chest do not demonstrate any  focal consolidation, pleural effusion, or pneumothorax. Stable cardiac silhouette. There is degenerative changes of the spine. No acute fracture.  IMPRESSION: No active cardiopulmonary disease.   Electronically Signed  By: Anner Crete M.D.  On: 07/11/2015 22:02  ASSESSMENT: 65 y.o. [place] woman, status post right breast biopsy 04/18/2015 for a clinical T1b N0, stage IA invasive ductal carcinoma, grade 2, strongly estrogen and progesterone receptor positive, HER-2 not amplified, with an MIB-1 of 3%  (1) status post right lumpectomy and sentinel lymph node sampling 05/18/2015 for a  pT1c pN0, stage IA invasive ductal carcinoma, grade 1, repeat HER-2 again negative  (2) Oncotype DX score of 11 predicts an outside the breast recurrence within 10 years of 7% if the patient's only systemic therapy is tamoxifen for 5 years. It also predicts no benefit from chemotherapy  (3) adjuvant radiation in process  (4) genetics testing 05/11/2015 through the Breast/Ovarian gene panel offered by [facility] found no deleterious mutations in ATM, BARD1, BRCA1, BRCA2, BRIP1, CDH1, CHEK2, EPCAM, FANCC, MLH1, MSH2, MSH6, NBN, PALB2, PMS2, PTEN, RAD51C, RAD51D, TP53, and XRCC2.  (5) to start anastrozole 08/12/2014  (a) DEXA scan at [facility] 05/30/2015 showed a T score of 1.0 (normal)  PLAN: Nardos is doing very well on the  anastrozole and the plan will be to continue that for 5 years.  It is encouraging that she has a normal bone density at baseline. Now that she is on a good exercise program including walking, hopefully she will be able to maintain her good bone density results.  She does have some knee discomfort which may limit her. If this becomes significant she may need orthopedic intervention. Hopefully with some weight loss and some rehabbing that should improve.  This fluid reaccumulation in the right breast is not in itself dangerous but it is a bit of a nuisance and  causes her discomfort. She is really scheduled to see Dr. Brantley Stage next week. Hopefully this problem will abate without further intervention.  Otherwise she will follow closely with Dr. Forde Dandy whom she sees in April and October. She also has appointments with Dr. Radford Pax and Dr. Brantley Stage. She will see me again in July. Her graph incidentally she is interested in becoming a Product manager and I gave her information on that. I also suggested she consider the "finding your near normal" program here.  She will call with any problems that may develop before her next visit.   :Chauncey Cruel, MD   09/28/2015 3:40 PM Medical Oncology and Hematology [facility] [place], [place] [address] Tel. [phone]    Fax. [phone]

## 2015-10-05 ENCOUNTER — Telehealth: Payer: Self-pay | Admitting: Nurse Practitioner

## 2015-10-05 NOTE — Telephone Encounter (Signed)
Received call from patient inquiring about upcoming appointment with this provider scheduled for 10/13/2015.  Explained to patient purpose of appointment including review of survivorship care plan.  Pt would prefer not to come in for appointment at this time and requests that copy be mailed to her.  This provider will mail copy of care plan to patient along with contact information in the event patient has questions or would like to schedule appointment in future.  Pt doing well without complaints; no questions at this time.

## 2015-10-13 ENCOUNTER — Encounter: Payer: BC Managed Care – PPO | Admitting: Nurse Practitioner

## 2015-10-23 ENCOUNTER — Ambulatory Visit (INDEPENDENT_AMBULATORY_CARE_PROVIDER_SITE_OTHER): Payer: BC Managed Care – PPO | Admitting: Internal Medicine

## 2015-10-23 ENCOUNTER — Encounter: Payer: Self-pay | Admitting: Internal Medicine

## 2015-10-23 VITALS — BP 122/70 | HR 56 | Ht 62.5 in | Wt 210.8 lb

## 2015-10-23 DIAGNOSIS — I480 Paroxysmal atrial fibrillation: Secondary | ICD-10-CM

## 2015-10-23 DIAGNOSIS — G4733 Obstructive sleep apnea (adult) (pediatric): Secondary | ICD-10-CM | POA: Diagnosis not present

## 2015-10-23 DIAGNOSIS — I10 Essential (primary) hypertension: Secondary | ICD-10-CM

## 2015-10-23 LAB — BASIC METABOLIC PANEL
BUN: 13 mg/dL (ref 7–25)
CALCIUM: 9.3 mg/dL (ref 8.6–10.4)
CO2: 32 mmol/L — ABNORMAL HIGH (ref 20–31)
CREATININE: 0.58 mg/dL (ref 0.50–0.99)
Chloride: 102 mmol/L (ref 98–110)
Glucose, Bld: 92 mg/dL (ref 65–99)
Potassium: 4 mmol/L (ref 3.5–5.3)
Sodium: 141 mmol/L (ref 135–146)

## 2015-10-23 LAB — MAGNESIUM: MAGNESIUM: 2 mg/dL (ref 1.5–2.5)

## 2015-10-23 NOTE — Patient Instructions (Addendum)
Medication Instructions:  Your physician recommends that you continue on your current medications as directed. Please refer to the Current Medication list given to you today.   Labwork: Your physician recommends that you return for lab work:  Mag/BMP   Testing/Procedures: None ordered   Follow-Up: Your physician wants you to follow-up in: 6 months with Dr Radford Pax and  12 months with Dr Vallery Ridge will receive a reminder letter in the mail two months in advance. If you don't receive a letter, please call our office to schedule the follow-up appointment.   Any Other Special Instructions Will Be Listed Below (If Applicable).     If you need a refill on your cardiac medications before your next appointment, please call your pharmacy.

## 2015-10-23 NOTE — Progress Notes (Signed)
Electrophysiology Office Note   Date:  10/23/2015   ID:  Courtney Dunlap, Courtney Dunlap [birth year], MRN [id number]  PCP:  Courtney Stack, MD  Cardiologist:  Dr Radford Pax Primary Electrophysiologist: Courtney Grayer, MD    Chief Complaint  Patient presents with  . Atrial Fibrillation    palpatations     History of Present Illness: Courtney Dunlap is a 65 y.o. female who presents today for electrophysiology evaluation.   No further sustained arrhythmias since returning to sotalol 22m BID.  Doing well at this time. Today, she denies symptoms of palpitations, chest pain, shortness of breath, orthopnea, PND, lower extremity edema, claudication, dizziness, presyncope, syncope, bleeding, or neurologic sequela. The patient is tolerating medications without difficulties and is otherwise without complaint today.    Past Medical History  Diagnosis Date  [place]     PAF 08/19/11 admitted due to AFIB with RVR  . Hypertension   . Hypercholesteremia   . GERD (gastroesophageal reflux disease)   . Elevated troponin 1/13    in setting of AF with RVR  . Obesity   . Hiatal hernia     with GERD  . Shingles   . Mild cognitive impairment with memory loss   . Allergic dermatitis   . Lipoma     of the brain  . Anxiety   . Depression   . Family history of breast cancer   . Sleep apnea     severe with AHI 37/hr now on CPAP at 13cm H2O  . Shortness of breath dyspnea   . Hyperthyroidism     pt. reports that she thinks she has hypothyroidism, [place]   . Arthritis     back, knees & ankles   . Cancer ([place]     skin- lower abdomen  . Breast cancer ([place]   . [place]bid [place] ([place]   . SVT (supraventricular tachycardia) ([facility]    Past Surgical History  Procedure Laterality Date  . Tubal ligation    . Tonsillectomy    . Dilation and curettage of uterus  1996     Polyp resection   . Hysteroscopy  4/12    D&C  . Appendectomy  28 years ago  .  Radioactive seed guided mastectomy with axillary sentinel lymph node biopsy Right 05/18/2015    Procedure: RIGHT BREAST RADIOACTIVE SEED PARTIAL MASTECTOMY WITH RIGHT SENTINEL LYMPH NODE MAPPING;  Surgeon: TErroll Luna MD;  [place];  Service: General;  Laterality: Right;     Current Outpatient Prescriptions  Medication Sig Dispense Refill  . acetaminophen (TYLENOL) 500 MG tablet Take 500 mg by mouth every 6 (six) hours as needed for pain.    .Courtney Dunlap (ARIMIDEX) 1 MG tablet Take 1 tablet (1 mg total) by mouth daily. 90 tablet 4  . atorvastatin (LIPITOR) 20 MG tablet Take 20 mg by mouth daily after breakfast.     . diclofenac sodium (VOLTAREN) 1 % GEL Apply 2 g topically as needed (FOR KNEES).     . hydrochlorothiazide (MICROZIDE) 12.5 MG capsule Take 1 capsule (12.5 mg total) by mouth daily. 90 capsule 3  . metoprolol tartrate (LOPRESSOR) 25 MG tablet Take 1 tablet (25 mg total) by mouth 3 (three) times daily as needed (for palpations and heart rate greater than 140 per minute). 45 tablet 3  . Omega-3 Fatty Acids (FISH OIL) 1000 MG CAPS Take 1,000 mg by mouth daily.    . ranitidine (  ZANTAC) 150 MG tablet Take 150 mg by mouth 2 (two) times daily as needed for heartburn.    . sotalol (BETAPACE) 80 MG tablet Take 1 tablet (80 mg total) by mouth 2 (two) times daily. 180 tablet 3  . traMADol (ULTRAM) 50 MG tablet Take 1 tablet (50 mg total) by mouth every 6 (six) hours as needed for moderate pain. 30 tablet 0  . vitamin B-12 (CYANOCOBALAMIN) 1000 MCG tablet Take 1,000 mcg by mouth daily.    . Vitamin D, Ergocalciferol, (DRISDOL) 50000 UNITS CAPS capsule Take 50,000 Units by mouth every 7 (seven) days. On Saturday    . warfarin (COUMADIN) 5 MG tablet Take 7.5 mg by mouth daily.      No current facility-administered medications for this visit.    Allergies:   Codeine; Adhesive; and Latex   Social History:  The patient  reports that she has quit smoking. Her smoking use included  Cigarettes. She quit after 4 years of use. She quit smokeless tobacco use about 41 years ago. She reports that she does not drink alcohol or use illicit drugs.   Family History:  The patient's  family history includes Brain cancer in her mother; Breast cancer in her paternal aunt and paternal aunt; Diabetes in her maternal grandmother; Heart attack in her maternal grandmother; Hypertension in her brother, mother, sister, and sister; Melanoma in her paternal aunt; Multiple myeloma in her paternal aunt; Other (age of onset: 28) in her daughter; Thyroid disease in her brother and sister.    ROS:  Please see the history of present illness.   All other systems are reviewed and negative.    PHYSICAL EXAM: VS:  BP 122/70 mmHg  Pulse 56  Ht 5' 2.5" (1.588 m)  Wt 210 lb 12.8 oz (95.618 kg)  BMI 37.92 kg/m2  LMP 11/21/2010 , BMI Body mass index is 37.92 kg/(m^2). GEN: Well nourished, well developed, in no acute distress HEENT: normal Neck: no JVD, carotid bruits, or masses Cardiac: RRR; no murmurs, rubs, or gallops,no edema  Respiratory:  clear to auscultation bilaterally, normal work of breathing GI: soft, nontender, nondistended, + BS MS: no deformity or atrophy Skin: warm and dry  Neuro:  Strength and sensation are intact Psych: euthymic mood, full affect  EKG:  EKG is ordered today. The ekg ordered today shows sinus rhythm 56 bpm, PR 142 msec, QRS 92 msec, Qtc 391 msec.   Recent Labs: 07/04/2015: ALT 22 07/11/2015: BUN 17; Creatinine, Ser 0.96; Hemoglobin 13.1; Platelets 202; Potassium 3.6; Sodium 137    Lipid Panel  No results found for: CHOL, TRIG, HDL, CHOLHDL, VLDL, LDLCALC, LDLDIRECT   Wt Readings from Last 3 Encounters:  10/23/15 210 lb 12.8 oz (95.618 kg)  09/28/15 212 lb 6.4 oz (96.344 kg)  09/01/15 217 lb 6.4 oz (98.612 kg)      ASSESSMENT AND PLAN:  1.  SVT, atrial fibrillation, and atrial flutter Doing well with sotalol No indication for change at this time. On  coumadin Today, I discussed Coumadin as well as novel anticoagulants including Pradaxa, Xarelto, and Eliquis today as indicated for risk reduction in stroke and systemic emboli with nonvalvular atrial fibrillation.  Risks, benefits, and alternatives to each of these drugs were discussed at length today.  She is clear that she would prefer to continue coumadin at this time.   2. Obesity Weight loss encouraged Body mass index is 37.92 kg/(m^2).  3. OSA Compliance with CPAP encouraged Followed by Dr Radford Pax  4.  HTN  Stable No change required today  Current medicines are reviewed at length with the patient today.   The patient does not have concerns regarding her medicines.  The following changes were made today:  none   Follow-up with Dr Radford Pax in 6 months and with me in a year  Signed, Courtney Grayer, MD  10/23/2015 10:08 AM     [facility] [address] [place] [address] [phone] (office) [phone] (fax)

## 2015-11-08 ENCOUNTER — Encounter: Payer: Self-pay | Admitting: Nurse Practitioner

## 2015-11-08 DIAGNOSIS — C50411 Malignant neoplasm of upper-outer quadrant of right female breast: Secondary | ICD-10-CM

## 2015-11-08 NOTE — Progress Notes (Signed)
The Survivorship Care Plan was mailed to Courtney Dunlap as she reported not being able to come in to the Survivorship Clinic for an in-person visit at this time. A letter was mailed to her outlining the purpose of the content of the care plan, as well as encouraging her to reach out to me with any questions or concerns.  My business card was included in the correspondence to the patient as well.  A copy of the care plan was also routed/faxed/mailed to [name], MD, the patient's PCP.  I will not be placing any follow-up appointments to the Survivorship Clinic for Courtney Dunlap, but I am happy to see her at any time in the future for any survivorship concerns that may arise. Thank you for allowing me to participate in her care!  Kenn File, [place] [phone]

## 2015-12-08 ENCOUNTER — Ambulatory Visit: Payer: BC Managed Care – PPO | Admitting: Obstetrics & Gynecology

## 2015-12-10 ENCOUNTER — Telehealth: Payer: Self-pay | Admitting: Oncology

## 2015-12-10 NOTE — Telephone Encounter (Signed)
s.w. pt and changed time of visit to morning due to md on call....pt ok and aware

## 2015-12-11 ENCOUNTER — Ambulatory Visit (INDEPENDENT_AMBULATORY_CARE_PROVIDER_SITE_OTHER): Payer: BC Managed Care – PPO | Admitting: Obstetrics & Gynecology

## 2015-12-11 ENCOUNTER — Encounter: Payer: Self-pay | Admitting: Obstetrics & Gynecology

## 2015-12-11 VITALS — BP 120/66 | HR 64 | Resp 16 | Ht 62.5 in | Wt 203.0 lb

## 2015-12-11 DIAGNOSIS — Z1211 Encounter for screening for malignant neoplasm of colon: Secondary | ICD-10-CM | POA: Diagnosis not present

## 2015-12-11 DIAGNOSIS — Z01419 Encounter for gynecological examination (general) (routine) without abnormal findings: Secondary | ICD-10-CM | POA: Diagnosis not present

## 2015-12-11 DIAGNOSIS — Z124 Encounter for screening for malignant neoplasm of cervix: Secondary | ICD-10-CM

## 2015-12-11 DIAGNOSIS — Z0000 Encounter for general adult medical examination without abnormal findings: Secondary | ICD-10-CM | POA: Diagnosis not present

## 2015-12-11 LAB — POCT URINALYSIS DIPSTICK
Bilirubin, UA: NEGATIVE
Blood, UA: NEGATIVE
Glucose, UA: NEGATIVE
Ketones, UA: NEGATIVE
LEUKOCYTES UA: NEGATIVE
NITRITE UA: NEGATIVE
PROTEIN UA: NEGATIVE
UROBILINOGEN UA: NEGATIVE
pH, UA: 7

## 2015-12-11 NOTE — Progress Notes (Signed)
65 y.o. [id number] MarriedCaucasianF here for annual exam.  Doing well.  Had 1A breast cancer last year with lumpectomy, sentinel node biopsy.  Now on anastrazole.  Has follow up with Dr. Jana Hakim in July.  Will see Dr. Brantley Stage in the summer.  Pt reports having some swelling in the breast where lumpectomy was done.  Pt did [employer] program at the [facility].  Down about 20 pounds.    PCP:  Dr. Forde Dandy.  Had routine appt this morning and lab work done.  INR was 2.2 today.     Patient's last menstrual period was 11/21/2010.          Sexually active: No.  The current method of family planning is post menopausal status.    Exercising: Yes.    Walking, Y machines Smoker: Former  Health Maintenance: Pap:  09/30/14 Neg. 01/02/12 Neg. HR HPV:neg History of abnormal Pap:  no MMG: 04/18/15 BIRADS5: Suspicious Malignant. US Breast Cancer Right Colonoscopy: does ifob BMD:  05/2015 Normal repeat 5 years  TDaP:  w/ PCP Screening Labs: PCP, Urine today: Negative   reports that she has quit smoking. Her smoking use included Cigarettes. She quit after 4 years of use. She quit smokeless tobacco use about 41 years ago. She reports that she does not drink alcohol or use illicit drugs.  Past Medical History  Diagnosis Date  . A-fib ([place])     PAF 08/19/11 admitted due to AFIB with RVR  . Hypertension   . Hypercholesteremia   . GERD (gastroesophageal reflux disease)   . Elevated troponin 1/13    in setting of AF with RVR  . Obesity   . Hiatal hernia     with GERD  . Shingles   . Mild cognitive impairment with memory loss   . Allergic dermatitis   . Lipoma     of the brain  . Anxiety   . Depression   . Family history of breast cancer   . Sleep apnea     severe with AHI 37/hr now on CPAP at 13cm H2O  . Shortness of breath dyspnea   . Hyperthyroidism     pt. reports that she thinks she has hypothyroidism, states MD- [place] took her off med. in the past 1-2 yrs.   . Arthritis     back, knees & ankles   .  Cancer ([place])     skin- lower abdomen  . Breast cancer ([place])   . Morbid obesity ([place])   . SVT (supraventricular tachycardia) [facility])     Past Surgical History  Procedure Laterality Date  . Tubal ligation    . Tonsillectomy    . Dilation and curettage of uterus  1996     Polyp resection   . Hysteroscopy  4/12    D&C  . Appendectomy  28 years ago  . Radioactive seed guided mastectomy with axillary sentinel lymph node biopsy Right 05/18/2015    Procedure: RIGHT BREAST RADIOACTIVE SEED PARTIAL MASTECTOMY WITH RIGHT SENTINEL LYMPH NODE MAPPING;  Surgeon: Erroll Luna, MD;  Location: [facility];  Service: General;  Laterality: Right;    Current Outpatient Prescriptions  Medication Sig Dispense Refill  . acetaminophen (TYLENOL) 500 MG tablet Take 500 mg by mouth every 6 (six) hours as needed for pain.    [name] anastrozole (ARIMIDEX) 1 MG tablet Take 1 tablet (1 mg total) by mouth daily. 90 tablet 4  . atorvastatin (LIPITOR) 20 MG tablet Take 20 mg by mouth daily after breakfast.     .  diclofenac sodium (VOLTAREN) 1 % GEL Apply 2 g topically as needed (FOR KNEES).     . hydrochlorothiazide (MICROZIDE) 12.5 MG capsule Take 1 capsule (12.5 mg total) by mouth daily. 90 capsule 3  . Omega-3 Fatty Acids (FISH OIL) 1000 MG CAPS Take 1,000 mg by mouth daily.    . ranitidine (ZANTAC) 150 MG tablet Take 150 mg by mouth 2 (two) times daily as needed for heartburn.    . sotalol (BETAPACE) 80 MG tablet Take 1 tablet (80 mg total) by mouth 2 (two) times daily. 180 tablet 3  . traMADol (ULTRAM) 50 MG tablet Take 1 tablet (50 mg total) by mouth every 6 (six) hours as needed for moderate pain. 30 tablet 0  . vitamin B-12 (CYANOCOBALAMIN) 1000 MCG tablet Take 1,000 mcg by mouth daily.    . Vitamin D, Ergocalciferol, (DRISDOL) 50000 UNITS CAPS capsule Take 50,000 Units by mouth every 7 (seven) days. On Saturday    . warfarin (COUMADIN) 5 MG tablet Take 7.5 mg by mouth daily.     . metoprolol tartrate (LOPRESSOR) 25 MG  tablet Take 1 tablet (25 mg total) by mouth 3 (three) times daily as needed (for palpations and heart rate greater than 140 per minute). (Patient not taking: Reported on 12/11/2015) 45 tablet 3   No current facility-administered medications for this visit.    Family History  Problem Relation Age of Onset  . Hypertension Mother   . Brain cancer Mother   . Hypertension Sister   . Thyroid disease Sister   . Hypertension Sister   . Hypertension Brother   . Thyroid disease Brother   . Diabetes Maternal Grandmother   . Heart attack Maternal Grandmother   . Breast cancer Paternal Aunt     dx in her late 44s- early 26s  . Melanoma Paternal Aunt   . Breast cancer Paternal Aunt   . Multiple myeloma Paternal Aunt   . Other Daughter 21    ITP    ROS:  Pertinent items are noted in HPI.  Otherwise, a comprehensive ROS was negative.  Exam:   BP 120/66 mmHg  Pulse 64  Resp 16  Ht 5' 2.5" (1.588 m)  Wt 203 lb (92.08 kg)  BMI 36.51 kg/m2  LMP 11/21/2010  Weight change: -18#  Height: 5' 2.5" (158.8 cm)  Ht Readings from Last 3 Encounters:  12/11/15 5' 2.5" (1.588 m)  10/23/15 5' 2.5" (1.588 m)  09/28/15 5' 2.5" (1.588 m)   General appearance: alert, cooperative and appears stated age Head: Normocephalic, without obvious abnormality, atraumatic Neck: no adenopathy, supple, symmetrical, trachea midline and thyroid normal to inspection and palpation Lungs: clear to auscultation bilaterally Breasts: normal appearance, no masses or tenderness, (on left), extensive radiation changes on right with two well healed scars.  Thickening noted along the scars. Heart: regular rate and rhythm Abdomen: soft, non-tender; bowel sounds normal; no masses,  no organomegaly Extremities: extremities normal, atraumatic, no cyanosis or edema Skin: Skin color, texture, turgor normal. No rashes or lesions Lymph nodes: Cervical, supraclavicular, and axillary nodes normal. No abnormal inguinal nodes  palpated Neurologic: Grossly normal   Pelvic: External genitalia:  no lesions              Urethra:  normal appearing urethra with no masses, tenderness or lesions              Bartholins and Skenes: normal  Vagina: normal appearing vagina with normal color and discharge, no lesions              Cervix: no lesions              Pap taken: Yes.   Bimanual Exam:  Uterus:  normal size, contour, position, consistency, mobility, non-tender              Adnexa: normal adnexa and no mass, fullness, tenderness               Rectovaginal: Confirms               Anus:  normal sphincter tone, no lesions  Chaperone was present for exam.  A:  Well Woman with normal exam PMP, no HRT Hyperthyroidism H/O afib, on chronic anticoagulation H/O MI--sees cardiology yearly (Dr. Radford Pax) GERD  H/O 1A breast cancer, ER/PR +, on anastrozole.  Will be on tamoxifen for 5 years.    P: Mammogram yearly pap smear with neg HR HPV 2013. Pap and HR HPV today. Sees Dr. Forde Dandy every 6 months.  Was seen today. return annually or prn

## 2015-12-13 LAB — IPS PAP TEST WITH HPV

## 2016-01-09 LAB — FECAL OCCULT BLOOD, IMMUNOCHEMICAL: IMMUNOLOGICAL FECAL OCCULT BLOOD TEST: NEGATIVE

## 2016-01-09 NOTE — Addendum Note (Signed)
Addended by: Susanne Greenhouse E on: 01/09/2016 11:13 AM   Modules accepted: Orders

## 2016-01-14 ENCOUNTER — Encounter: Payer: Self-pay | Admitting: Nurse Practitioner

## 2016-01-19 IMAGING — NM NM MISC PROCEDURE
6 series · 36 of 36 positions shown · non-contrast
Comparison: none

[Series 1: rest · 6.40mm/px · 6 of 64 frames shown]
[frame 6/64]
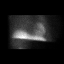
[frame 16/64]
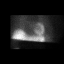
[frame 27/64]
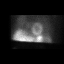
[frame 38/64]
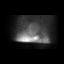
[frame 48/64]
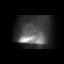
[frame 59/64]
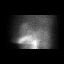

[Series 1: stress-gsp · 6.40mm/px · 6 of 503 frames shown]
[frame 42/503]
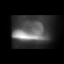
[frame 126/503]
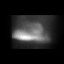
[frame 210/503]
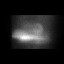
[frame 294/503]
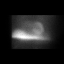
[frame 378/503]
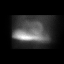
[frame 462/503]
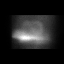

[Series 1: wbr_s-card_st stress-gsp · 6.4mm · 6.40mm/px · 6 of 161 frames shown]
[frame 14/161]
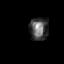
[frame 40/161]
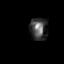
[frame 67/161]
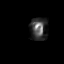
[frame 94/161]
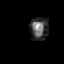
[frame 121/161]
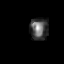
[frame 148/161]
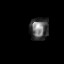

[Series 1: wbr_s-card_st stress-sum-em · 6.4mm · 6.40mm/px · 6 of 21 frames shown]
[frame 2/21]
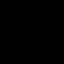
[frame 6/21]
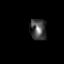
[frame 9/21]
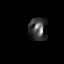
[frame 13/21]
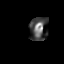
[frame 16/21]
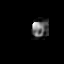
[frame 20/21]
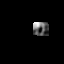

[Series 1: wbr_r-card_st rest · 6.4mm · 6.40mm/px · 6 of 19 frames shown]
[frame 2/19]
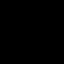
[frame 5/19]
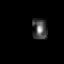
[frame 8/19]
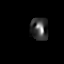
[frame 11/19]
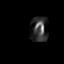
[frame 14/19]
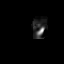
[frame 18/19]
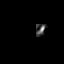

[Series 1: stress-sum-em · 6.40mm/px · 6 of 63 frames shown]
[frame 6/63]
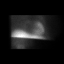
[frame 16/63]
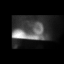
[frame 27/63]
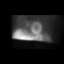
[frame 37/63]
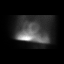
[frame 48/63]
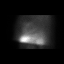
[frame 58/63]
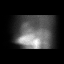

[36 of 36 positions shown; findings below may reference images not displayed]

Canned report from images found in remote index.

Refer to host system for actual result text.

## 2016-02-15 ENCOUNTER — Encounter (HOSPITAL_COMMUNITY): Payer: Self-pay

## 2016-03-03 NOTE — Progress Notes (Signed)
[facility]  [phone] [phone]     ID: Courtney Dunlap DOB: 09/25/1950  MR#: [id number]  CSN#:[id number]  Patient Care Team: Stephen South, MD as PCP - General (Endocrinology) Karrar Husain, MD as Consulting Physician (Internal Medicine) Traci R Turner, MD as Consulting Physician (Cardiology) Thomas Cornett, MD as Consulting Physician (General Surgery) Gustav C Magrinat, MD as Consulting Physician (Oncology) Sarah Squire, MD as Attending Physician (Radiation Oncology) Keisha N Martini, RN as Registered Nurse Dawn C Stuart, RN as Registered Nurse Mary S Miller, MD as Consulting Physician (Gynecology) Heather Thompson Mackey, NP as Nurse Practitioner (Nurse Practitioner) PCP: SOUTH,STEPHEN ALAN, MD OTHER MD: Rick Cornella M.D.  CHIEF COMPLAINT: Estrogen receptor positive breast cancer  CURRENT TREATMENT: anastrozole   BREAST CANCER HISTORY: From the original intake note:  Courtney Dunlap had routine screening mammography at [facility] 04/11/2015 showing a new irregular lesion in the right breast. Diagnostic right unilateral mammogram and ultrasound 04/13/2015 found the breast density to be category B. In the right breast at the 9:00 position there was a mass with indistinct margins associated with architectural distortion. By ultrasound this was a 1 cm. Ultrasound of the axilla showed no abnormality.  Biopsy of the right breast mass in question 04/18/2015 showed ([id number]) and invasive ductal carcinoma, grade 2, estrogen receptor and progesterone receptor both 100% positive, with strong staining intensity, with an MIB-1 of 3%, and no HER-2 amplification, the signals ratio being 1.59 and the number per cell 2.30.  The patient's subsequent history is as detailed below.   INTERVAL HISTORY: Courtney Dunlap returns today for follow-up of her breast cancer accompanied by her sister Courtney Dunlap. Courtney Dunlap continues on anastrozole. Generally she tolerates it well, with some hot  flashes, but more importantly she has been experiencing increasing fatigue and aches and pains. She hurts in her elbows and knees particularly. The third digit of the left hand also is "very stiff". She is going to the wife to exercise on a regular basis.  REVIEW OF SYSTEMS: Found some spots on her back that are of concern and she wanted me to look at those. Otherwise a detailed review of systems today was noncontributory . PAST MEDICAL HISTORY: Past Medical History:  Diagnosis Date  . A-fib ([facility])   . Allergic dermatitis   . Anxiety   . Arthritis    back, knees & ankles   . Breast cancer ([facility])   . Cancer ([facility])    skin- lower abdomen  . Depression   . GERD (gastroesophageal reflux disease)   . Hiatal hernia    with GERD  . Hypercholesteremia   . Hypertension   . Hyperthyroidism    pt. reports that she thinks she has hypothyroidism, states MD- [place] took her off med. in the past 1-2 yrs.   . Mild cognitive impairment with memory loss   . Morbid obesity ([facility])   . Sleep apnea    severe with AHI 37/hr now on CPAP at 13cm H2O    PAST SURGICAL HISTORY: Past Surgical History:  Procedure Laterality Date  . APPENDECTOMY  28 years ago  . DILATION AND CURETTAGE OF UTERUS  1996    Polyp resection   . HYSTEROSCOPY  4/12   D&C  . RADIOACTIVE SEED GUIDED MASTECTOMY WITH AXILLARY SENTINEL LYMPH NODE BIOPSY Right 05/18/2015   Procedure: RIGHT BREAST RADIOACTIVE SEED PARTIAL MASTECTOMY WITH RIGHT SENTINEL LYMPH NODE MAPPING;  Surgeon: Thomas Cornett, MD;  Location: [facility] OR;  Service: General;  Laterality: Right;  . TONSILLECTOMY    .   TUBAL LIGATION      FAMILY HISTORY Family History  Problem Relation Age of Onset  . Hypertension Mother   . Brain cancer Mother   . Hypertension Sister   . Thyroid disease Sister   . Hypertension Sister   . Hypertension Brother   . Thyroid disease Brother   . Diabetes Maternal Grandmother   . Heart attack Maternal Grandmother   . Breast cancer Paternal  Aunt     dx in her late 49s- early 29s  . Melanoma Paternal Aunt   . Breast cancer Paternal Aunt   . Multiple myeloma Paternal Aunt   . Other Daughter 36    ITP   the patient's father was diagnosed with squamous cell cancer of the throat at age 59 and died 73 years later. The patient's mother died from end-stage renal disease at age 28. The patient had one brother, 2 sisters. There is no cancer in the immediate family except as noted, but one of the patient's father's 5 sisters was diagnosed with breast cancer before the age of 68. Another 1 had multiple myeloma. On the patient's mother sides there is a history of brain tumor in a niece.   GYNECOLOGIC HISTORY:  Patient's last menstrual period was 11/21/2010. Menarche age 51, first live birth age 44. The patient is GX P2. She went through menopause in 2002 or thereabouts. She did not take hormone replacement. 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 Her husband Gwyndolyn Saxon "Rush Landmark" [place] is a retired Clinical biochemist. Daughter Courtney Dunlap lives in [place] and is a housewife. Daughter "Courtney Dunlap" and [place] this in [place]. 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 The patient has 2 grandchildren and 3 great-grandchildren. She is a Psychologist, forensic.    ADVANCED DIRECTIVES: Not in place   HEALTH MAINTENANCE: Social History  Substance Use Topics  . Smoking status: Former Smoker    Years: 4.00    Types: Cigarettes  . Smokeless tobacco: Former Systems developer    Quit date: 05/15/1974     Comment: quit 35-40 years ago  . Alcohol use No     Colonoscopy: ?1997  PAP: FEB 2016  B[facility] density: October 2016 [facility], normal  Lipid panel:  Allergies  Allergen Reactions  . Codeine Swelling    Facial/lips  . Adhesive [Tape] Itching and Rash    MUST USE PAPER TAPE-CAUSES  BLISTERS AND BRUISES ALSO  . Latex Itching and Rash    Current Outpatient Prescriptions  Medication Sig Dispense Refill  . acetaminophen (TYLENOL) 500 MG tablet Take 500 mg by mouth every 6 (six) hours as needed for pain.    [name] anastrozole (ARIMIDEX) 1 MG tablet Take 1 tablet (1 mg total) by mouth daily. 90 tablet 4  . atorvastatin (LIPITOR) 20 MG tablet Take 20 mg by mouth daily after breakfast.     . diclofenac sodium (VOLTAREN) 1 % GEL Apply 2 g topically as needed (FOR KNEES).     . hydrochlorothiazide (MICROZIDE) 12.5 MG capsule Take 1 capsule (12.5 mg total) by mouth daily. 90 capsule 3  . metoprolol tartrate (LOPRESSOR) 25 MG tablet Take 1 tablet (25 mg total) by mouth 3 (three) times daily as needed (for palpations and heart rate greater than 140 per minute). (Patient not taking: Reported on 12/11/2015) 45 tablet 3  . Omega-3 Fatty Acids (FISH OIL) 1000 MG CAPS  Take 1,000 mg by mouth daily.    . ranitidine (ZANTAC) 150 MG tablet Take 150 mg by mouth 2 (two) times daily as needed for heartburn.    . sotalol (BETAPACE) 80 MG tablet Take 1 tablet (80 mg total) by mouth 2 (two) times daily. 180 tablet 3  . traMADol (ULTRAM) 50 MG tablet Take 1 tablet (50 mg total) by mouth every 6 (six) hours as needed for moderate pain. 30 tablet 0  . vitamin B-12 (CYANOCOBALAMIN) 1000 MCG tablet Take 1,000 mcg by mouth daily.    . Vitamin D, Ergocalciferol, (DRISDOL) 50000 UNITS CAPS capsule Take 50,000 Units by mouth every 7 (seven) days. On Saturday    . warfarin (COUMADIN) 5 MG tablet Take 7.5 mg by mouth daily.      No current facility-administered medications for this visit.     OBJECTIVE: Middle-aged white woman In no acute distress Vitals:   03/04/16 1030  BP: 132/64  Pulse: 61  Resp: 18  Temp: 98.7 F (37.1 C)     Body mass index is 35.93 kg/m.    ECOG FS:1 - Symptomatic but completely ambulatory  Sclerae unicteric, EOMs intact Oropharynx clear and moist No cervical or supraclavicular  adenopathy Lungs no rales or rhonchi Heart regular rate and rhythm Abd soft, nontender, positive bowel sounds MSK no focal spinal tenderness, no upper extremity lymphedema Neuro: nonfocal, well oriented, appropriate affect Breasts: The right breast is status post lumpectomy and radiation. There is significant skin thickening. There is some scar tissue associated with the incision. None of this is suspicious for recurrence. The right axilla is benign. The left breast is unremarkable  skin: The lesions on her back are couple of early separate keratoses. They appear benign.   LAB RESULTS:  CMP     Component Value Date/Time   NA 142 03/04/2016 1006   K 3.8 03/04/2016 1006   CL 102 10/23/2015 1017   CO2 28 03/04/2016 1006   GLUCOSE 96 03/04/2016 1006   BUN 13.9 03/04/2016 1006   CREATININE 0.8 03/04/2016 1006   CALCIUM 9.5 03/04/2016 1006   PROT 7.1 03/04/2016 1006   ALBUMIN 3.7 03/04/2016 1006   AST 26 03/04/2016 1006   ALT 27 03/04/2016 1006   ALKPHOS 115 03/04/2016 1006   BILITOT 0.93 03/04/2016 1006   GFRNONAA >60 07/11/2015 2116   GFRAA >60 07/11/2015 2116    INo results found for: SPEP, UPEP  Lab Results  Component Value Date   WBC 6.0 03/04/2016   NEUTROABS 3.8 03/04/2016   HGB 12.8 03/04/2016   HCT 38.1 03/04/2016   MCV 92.0 03/04/2016   PLT 169 03/04/2016      Chemistry      Component Value Date/Time   NA 142 03/04/2016 1006   K 3.8 03/04/2016 1006   CL 102 10/23/2015 1017   CO2 28 03/04/2016 1006   BUN 13.9 03/04/2016 1006   CREATININE 0.8 03/04/2016 1006      Component Value Date/Time   CALCIUM 9.5 03/04/2016 1006   ALKPHOS 115 03/04/2016 1006   AST 26 03/04/2016 1006   ALT 27 03/04/2016 1006   BILITOT 0.93 03/04/2016 1006       No results found for: LABCA2  No components found for: LABCA125  No results for input(s): INR in the last 168 hours.  Urinalysis    Component Value Date/Time   BILIRUBINUR N 12/11/2015 1321   PROTEINUR N  12/11/2015 1321   UROBILINOGEN negative 12/11/2015 1321   NITRITE N   12/11/2015 1321   LEUKOCYTESUR Negative 12/11/2015 1321    STUDIES: No results found.   ASSESSMENT: 65 y.o. [place] woman, status post right breast upper outer quadrant biopsy 04/18/2015 for a clinical T1b N0, stage IA invasive ductal carcinoma, grade 2, strongly estrogen and progesterone receptor positive, HER-2 not amplified, with an MIB-1 of 3%  (1) status post right lumpectomy and sentinel lymph node sampling 05/18/2015 for a  pT1c pN0, stage IA invasive ductal carcinoma, grade 1, repeat HER-2 again negative  (2) Oncotype DX score of 11 predicts an outside the breast recurrence within 10 years of 7% if the patient's only systemic therapy is tamoxifen for 5 years. It also predicts no benefit from chemotherapy  (3) adjuvant radiation completed December 2017 thank you  (4) genetics testing 05/11/2015 through the Breast/Ovarian gene panel offered by [facility] found no deleterious mutations in ATM, BARD1, BRCA1, BRCA2, BRIP1, CDH1, CHEK2, EPCAM, FANCC, MLH1, MSH2, MSH6, NBN, PALB2, PMS2, PTEN, RAD51C, RAD51D, TP53, and XRCC2.  (5) started anastrozole 08/12/2014  (a) DEXA scan at [facility] 05/30/2015 showed a T score of 1.0 (normal)  PLAN: Michaelah is having enough arthralgias and myalgias that I do think we need to do a test and stop the anastrozole for 2 months. I reassured her that this should not put her at any significant risk.  She will return to see me in October. If she is feeling just the same then we will go back to anastrozole, but if she is feeling a lot better we will consider exemestane or tamoxifen.  I reassured her that her ask lesions are benign. She also has some petechia-like lesions on her feet but not elsewhere. Her platelet count however is normal  She knows to call for any problems that may develop before her next visit here.   :MAGRINAT,GUSTAV C, MD   03/04/2016 10:47 AM Medical  Oncology and Hematology [facility] [address] [place], [place] [address] Tel. [phone]    Fax. [phone] 

## 2016-03-04 ENCOUNTER — Ambulatory Visit (HOSPITAL_BASED_OUTPATIENT_CLINIC_OR_DEPARTMENT_OTHER): Payer: BC Managed Care – PPO | Admitting: Oncology

## 2016-03-04 ENCOUNTER — Ambulatory Visit: Payer: BC Managed Care – PPO | Admitting: Oncology

## 2016-03-04 ENCOUNTER — Telehealth: Payer: Self-pay | Admitting: Oncology

## 2016-03-04 ENCOUNTER — Other Ambulatory Visit (HOSPITAL_BASED_OUTPATIENT_CLINIC_OR_DEPARTMENT_OTHER): Payer: BC Managed Care – PPO

## 2016-03-04 ENCOUNTER — Other Ambulatory Visit: Payer: BC Managed Care – PPO

## 2016-03-04 VITALS — BP 132/64 | HR 61 | Temp 98.7°F | Resp 18 | Ht 62.5 in | Wt 199.6 lb

## 2016-03-04 DIAGNOSIS — C50411 Malignant neoplasm of upper-outer quadrant of right female breast: Secondary | ICD-10-CM

## 2016-03-04 DIAGNOSIS — M25562 Pain in left knee: Secondary | ICD-10-CM | POA: Diagnosis not present

## 2016-03-04 DIAGNOSIS — M791 Myalgia: Secondary | ICD-10-CM

## 2016-03-04 DIAGNOSIS — M25522 Pain in left elbow: Secondary | ICD-10-CM | POA: Diagnosis not present

## 2016-03-04 DIAGNOSIS — R233 Spontaneous ecchymoses: Secondary | ICD-10-CM

## 2016-03-04 DIAGNOSIS — Z170 Estrogen receptor positive status [ER+]: Secondary | ICD-10-CM

## 2016-03-04 LAB — CBC WITH DIFFERENTIAL/PLATELET
BASO%: 1.1 % (ref 0.0–2.0)
Basophils Absolute: 0.1 10*3/uL (ref 0.0–0.1)
EOS%: 2.5 % (ref 0.0–7.0)
Eosinophils Absolute: 0.2 10*3/uL (ref 0.0–0.5)
HEMATOCRIT: 38.1 % (ref 34.8–46.6)
HEMOGLOBIN: 12.8 g/dL (ref 11.6–15.9)
LYMPH#: 1.1 10*3/uL (ref 0.9–3.3)
LYMPH%: 18.5 % (ref 14.0–49.7)
MCH: 30.8 pg (ref 25.1–34.0)
MCHC: 33.5 g/dL (ref 31.5–36.0)
MCV: 92 fL (ref 79.5–101.0)
MONO#: 0.8 10*3/uL (ref 0.1–0.9)
MONO%: 13.4 % (ref 0.0–14.0)
NEUT#: 3.8 10*3/uL (ref 1.5–6.5)
NEUT%: 64.5 % (ref 38.4–76.8)
Platelets: 169 10*3/uL (ref 145–400)
RBC: 4.14 10*6/uL (ref 3.70–5.45)
RDW: 13.4 % (ref 11.2–14.5)
WBC: 6 10*3/uL (ref 3.9–10.3)

## 2016-03-04 LAB — COMPREHENSIVE METABOLIC PANEL
ALT: 27 U/L (ref 0–55)
AST: 26 U/L (ref 5–34)
Albumin: 3.7 g/dL (ref 3.5–5.0)
Alkaline Phosphatase: 115 U/L (ref 40–150)
Anion Gap: 10 mEq/L (ref 3–11)
BUN: 13.9 mg/dL (ref 7.0–26.0)
CALCIUM: 9.5 mg/dL (ref 8.4–10.4)
CHLORIDE: 104 meq/L (ref 98–109)
CO2: 28 mEq/L (ref 22–29)
CREATININE: 0.8 mg/dL (ref 0.6–1.1)
EGFR: 81 mL/min/{1.73_m2} — ABNORMAL LOW (ref >90)
Glucose: 96 mg/dl (ref 70–140)
Potassium: 3.8 mEq/L (ref 3.5–5.1)
Sodium: 142 mEq/L (ref 136–145)
TOTAL PROTEIN: 7.1 g/dL (ref 6.4–8.3)
Total Bilirubin: 0.93 mg/dL (ref 0.20–1.20)

## 2016-03-04 NOTE — Telephone Encounter (Signed)
appt made and avs printed °

## 2016-03-05 LAB — VITAMIN D 25 HYDROXY (VIT D DEFICIENCY, FRACTURES): VIT D 25 HYDROXY: 46.4 ng/mL (ref 30.0–100.0)

## 2016-04-18 ENCOUNTER — Encounter: Payer: Self-pay | Admitting: Cardiology

## 2016-05-02 ENCOUNTER — Encounter: Payer: Self-pay | Admitting: Cardiology

## 2016-05-02 ENCOUNTER — Ambulatory Visit (INDEPENDENT_AMBULATORY_CARE_PROVIDER_SITE_OTHER): Payer: BC Managed Care – PPO | Admitting: Cardiology

## 2016-05-02 ENCOUNTER — Encounter (INDEPENDENT_AMBULATORY_CARE_PROVIDER_SITE_OTHER): Payer: Self-pay

## 2016-05-02 VITALS — BP 142/78 | HR 56 | Ht 62.0 in | Wt 204.8 lb

## 2016-05-02 DIAGNOSIS — G4733 Obstructive sleep apnea (adult) (pediatric): Secondary | ICD-10-CM

## 2016-05-02 DIAGNOSIS — I10 Essential (primary) hypertension: Secondary | ICD-10-CM | POA: Diagnosis not present

## 2016-05-02 DIAGNOSIS — I480 Paroxysmal atrial fibrillation: Secondary | ICD-10-CM

## 2016-05-02 DIAGNOSIS — I471 Supraventricular tachycardia: Secondary | ICD-10-CM

## 2016-05-02 NOTE — Patient Instructions (Signed)

## 2016-05-02 NOTE — Progress Notes (Signed)
Cardiology Office Note    Date:  05/02/2016   ID:  Courtney Dunlap, DOB [birth year], MRN [id number]  PCP:  [name], MD  Cardiologist:  Fransico Him, MD   Chief Complaint  Patient presents with  . Atrial Fibrillation  . Hypertension  . Sleep Apnea    History of Present Illness:  Courtney Dunlap is a 65 y.o. female with a history of PAF in the setting of hyperthyroidism, SVT, HTN and OSA on CPAP who is doing well.She denies any chest pain, SOB, DOE (except with going up stairs or exercising) or syncope.  She has chronic  LE edema mainly from achilles tendonitis. She has had a few episodes of palpitations but only lasted 15 minutes.  She tolerates the CPAP and uses a nasal pillow mask which she tolerates well.  She feels rested in the am but does nap during the day some.   Past Medical History:  Diagnosis Date  . A-fib ([place])   . Allergic dermatitis   . Anxiety   . Arthritis    back, knees & ankles   . Breast cancer ([place])   . Cancer ([place])    skin- lower abdomen  . Depression   . GERD (gastroesophageal reflux disease)   . Hiatal hernia    with GERD  . Hypercholesteremia   . Hypertension   . Hyperthyroidism    pt. reports that she thinks she has hypothyroidism, states MD- [place] took her off med. in the past 1-2 yrs.   . Mild cognitive impairment with memory loss   . Morbid obesity ([place])   . Sleep apnea    severe with AHI 37/hr now on CPAP at 13cm H2O  . SVT (supraventricular tachycardia) (HCC)     Past Surgical History:  Procedure Laterality Date  . APPENDECTOMY  28 years ago  . DILATION AND CURETTAGE OF UTERUS  1996    Polyp resection   . HYSTEROSCOPY  4/12   D&C  . RADIOACTIVE SEED GUIDED MASTECTOMY WITH AXILLARY SENTINEL LYMPH NODE BIOPSY Right 05/18/2015   Procedure: RIGHT BREAST RADIOACTIVE SEED PARTIAL MASTECTOMY WITH RIGHT SENTINEL LYMPH NODE MAPPING;  Surgeon: Erroll Luna, MD;  Location: [facility];  Service: General;  Laterality: Right;  .  TONSILLECTOMY    . TUBAL LIGATION      Current Medications: Outpatient Medications Prior to Visit  Medication Sig Dispense Refill  . acetaminophen (TYLENOL) 500 MG tablet Take 500 mg by mouth every 6 (six) hours as needed for pain.    [name] atorvastatin (LIPITOR) 20 MG tablet Take 20 mg by mouth daily after breakfast.     . diclofenac sodium (VOLTAREN) 1 % GEL Apply 2 g topically as needed (FOR KNEES).     . hydrochlorothiazide (MICROZIDE) 12.5 MG capsule Take 1 capsule (12.5 mg total) by mouth daily. 90 capsule 3  . metoprolol tartrate (LOPRESSOR) 25 MG tablet Take 1 tablet (25 mg total) by mouth 3 (three) times daily as needed (for palpations and heart rate greater than 140 per minute). 45 tablet 3  . Omega-3 Fatty Acids (FISH OIL) 1000 MG CAPS Take 1,000 mg by mouth daily.    . ranitidine (ZANTAC) 150 MG tablet Take 150 mg by mouth 2 (two) times daily as needed for heartburn.    . sotalol (BETAPACE) 80 MG tablet Take 1 tablet (80 mg total) by mouth 2 (two) times daily. 180 tablet 3  . traMADol (ULTRAM) 50 MG tablet Take 1 tablet (50 mg total) by mouth  every 6 (six) hours as needed for moderate pain. 30 tablet 0  . vitamin B-12 (CYANOCOBALAMIN) 1000 MCG tablet Take 1,000 mcg by mouth daily.    . Vitamin D, Ergocalciferol, (DRISDOL) 50000 UNITS CAPS capsule Take 50,000 Units by mouth every 7 (seven) days. On Saturday    . warfarin (COUMADIN) 5 MG tablet Take 7.5 mg by mouth daily.     [name] anastrozole (ARIMIDEX) 1 MG tablet Take 1 tablet (1 mg total) by mouth daily. (Patient not taking: Reported on 05/02/2016) 90 tablet 4   No facility-administered medications prior to visit.      Allergies:   Codeine; Adhesive [tape]; and Latex   Social History   Social History  . Marital status: Married    Spouse name: N/A  . Number of children: 2  . Years of education: N/A   Occupational History  .  [place] History Main Topics  . Smoking status: Former Smoker    Years: 4.00     Types: Cigarettes  . Smokeless tobacco: Former Systems developer    Quit date: 05/15/1974     Comment: quit 35-40 years ago  . Alcohol use No  . Drug use: No  . Sexual activity: Not Currently    Birth control/ protection: Post-menopausal   Other Topics Concern  . None   Social History Narrative  . None     Family History:  The patient's family history includes Brain cancer in her mother; Breast cancer in her paternal aunt and paternal aunt; Diabetes in her maternal grandmother; Heart attack in her maternal grandmother; Hypertension in her brother, mother, sister, and sister; Melanoma in her paternal aunt; Multiple myeloma in her paternal aunt; Other (age of onset: 51) in her daughter; Thyroid disease in her brother and sister.   ROS:   Please see the history of present illness.    ROS All other systems reviewed and are negative.  No flowsheet data found.     PHYSICAL EXAM:   VS:  BP (!) 142/78   Pulse (!) 56   Ht '5\' 2"'  (1.575 m)   Wt 204 lb 12.8 oz (92.9 kg)   LMP 11/21/2010 Comment: spotting-had hysteroscopy/d&c  SpO2 98%   BMI 37.46 kg/m    GEN: Well nourished, well developed, in no acute distress  HEENT: normal  Neck: no JVD, carotid bruits, or masses Cardiac: RRR; no murmurs, rubs, or gallops.  1+ edema.  Intact distal pulses bilaterally.  Respiratory:  clear to auscultation bilaterally, normal work of breathing GI: soft, nontender, nondistended, + BS MS: no deformity or atrophy  Skin: warm and dry, no rash Neuro:  Alert and Oriented x 3, Strength and sensation are intact Psych: euthymic mood, full affect  Wt Readings from Last 3 Encounters:  05/02/16 204 lb 12.8 oz (92.9 kg)  03/04/16 199 lb 9.6 oz (90.5 kg)  12/11/15 203 lb (92.1 kg)      Studies/Labs Reviewed:   EKG:  EKG is not ordered today.    Recent Labs: 10/23/2015: Magnesium 2.0 03/04/2016: ALT 27; BUN 13.9; Creatinine 0.8; HGB 12.8; Platelets 169; Potassium 3.8; Sodium 142   Lipid Panel No results  found for: CHOL, TRIG, HDL, CHOLHDL, VLDL, LDLCALC, LDLDIRECT  Additional studies/ records that were reviewed today include:  none    ASSESSMENT:    1. Paroxysmal atrial fibrillation (HCC)   2. Benign essential HTN   3. OSA (obstructive sleep apnea)   4. SVT (supraventricular tachycardia) ([place])  PLAN:  In order of problems listed above:  1.  PAF - maintaining NSR.  Continue Betapace and warfarin.  She uses metoprolol PRN for breakthrough 2.  HTN - BP controlled on current meds.  Continue diuretic/BB. 3.  OSA - the patient is tolerating PAP therapy well without any problems. The patient has been using and benefiting from CPAP use and will continue to benefit from therapy. I will get a download from her DME. 4.  SVT with no reoccurence.  Continue BB.     Medication Adjustments/Labs and Tests Ordered: Current medicines are reviewed at length with the patient today.  Concerns regarding medicines are outlined above.  Medication changes, Labs and Tests ordered today are listed in the Patient Instructions below.  There are no Patient Instructions on file for this visit.   Signed, Fransico Him, MD  05/02/2016 10:15 AM    [address], [place], [place]  [address] Phone: [phone]; Fax: [phone]

## 2016-05-07 ENCOUNTER — Encounter: Payer: Self-pay | Admitting: Obstetrics & Gynecology

## 2016-05-13 ENCOUNTER — Ambulatory Visit (HOSPITAL_BASED_OUTPATIENT_CLINIC_OR_DEPARTMENT_OTHER): Payer: BC Managed Care – PPO | Admitting: Oncology

## 2016-05-13 VITALS — BP 132/54 | HR 67 | Temp 98.4°F | Resp 18 | Ht 62.0 in | Wt 204.9 lb

## 2016-05-13 DIAGNOSIS — N6489 Other specified disorders of breast: Secondary | ICD-10-CM | POA: Diagnosis not present

## 2016-05-13 DIAGNOSIS — Z170 Estrogen receptor positive status [ER+]: Secondary | ICD-10-CM

## 2016-05-13 DIAGNOSIS — C50411 Malignant neoplasm of upper-outer quadrant of right female breast: Secondary | ICD-10-CM

## 2016-05-13 DIAGNOSIS — Z79811 Long term (current) use of aromatase inhibitors: Secondary | ICD-10-CM | POA: Diagnosis not present

## 2016-05-13 MED ORDER — ANASTROZOLE 1 MG PO TABS: 1.0000 mg | ORAL_TABLET | Freq: Every day | ORAL | 4 refills | Status: DC

## 2016-05-13 NOTE — Progress Notes (Signed)
[facility]  Telephone:([phone]) [phone] Fax:(336) [phone]     ID: Courtney Dunlap DOB: 03-Aug-1951  MR#: [id number]  LPF#:[id number]  Patient Care Team: Reynold Bowen, MD as PCP - General (Endocrinology) Wenda Low, MD as Consulting Physician (Internal Medicine) Sueanne Margarita, MD as Consulting Physician (Cardiology) Erroll Luna, MD as Consulting Physician (General Surgery) Chauncey Cruel, MD as Consulting Physician (Oncology) Eppie Gibson, MD as Attending Physician (Radiation Oncology) Rockwell Germany, RN as Registered Nurse Mauro Kaufmann, RN as Registered Nurse Megan Salon, MD as Consulting Physician (Gynecology) Sylvan Cheese, NP as Nurse Practitioner (Nurse Practitioner) PCP: [name], MD OTHER MD: Christene Slates M.D.  CHIEF COMPLAINT: Estrogen receptor positive breast cancer  CURRENT TREATMENT: anastrozole   BREAST CANCER HISTORY: From the original intake note:  Courtney Dunlap had routine screening mammography at [facility] 04/11/2015 showing a new irregular lesion in the right breast. Diagnostic right unilateral mammogram and ultrasound 04/13/2015 found the breast density to be category B. In the right breast at the 9:00 position there was a mass with indistinct margins associated with architectural distortion. By ultrasound this was a 1 cm. Ultrasound of the axilla showed no abnormality.  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  The patient's subsequent history is as detailed below.   INTERVAL HISTORY: Courtney Dunlap returns today for follow-up of her estrogen receptor positive breast cancer. At the last visit she complained of multiple aches and pains so we decided to give  anastrozole A location. She has been off 2 months now and there has been really no improvement in symptoms. She hurts just as much particularly over her knees and ankles as before. Accordingly she is agreeable to resuming that medication.   REVIEW OF SYSTEMS: Aside from the knee pain she is bothered by the seroma in the right breast. This was recently evaluated with ultrasound and it is felt to be benign. However it is now loculated and it would be a bit more complex to drain. She has had no pain or erythema in that area. [facility] days a [facility]. A detailed review of systems today was otherwise stable.   PAST MEDICAL HISTORY: Past Medical History:  Diagnosis Date  . A-fib ([place])   . Allergic dermatitis   . Anxiety   . Arthritis    back, knees & ankles   . Breast cancer ([place])   . Cancer ([place])    skin- lower abdomen  . Depression   . GERD (gastroesophageal reflux disease)   . Hiatal hernia    with GERD  . Hypercholesteremia   . Hypertension   . Hyperthyroidism    pt. reports that she thinks she has hypothyroidism, states MD- [place] took her off med. in the past 1-2 yrs.   . Mild cognitive impairment with memory loss   . Morbid obesity ([place])   . Sleep apnea    severe with AHI 37/hr now on CPAP at 13cm H2O  . SVT (supraventricular tachycardia) ([place])     PAST SURGICAL HISTORY: Past Surgical History:  Procedure Laterality Date  . APPENDECTOMY  28 years ago  . DILATION AND CURETTAGE OF UTERUS  1996    Polyp resection   .  HYSTEROSCOPY  4/12   D&C  . RADIOACTIVE SEED GUIDED MASTECTOMY WITH AXILLARY SENTINEL LYMPH NODE BIOPSY Right 05/18/2015   Procedure: RIGHT BREAST RADIOACTIVE SEED PARTIAL MASTECTOMY WITH RIGHT SENTINEL LYMPH NODE MAPPING;  Surgeon: Erroll Luna, MD;  Location: [facility];  Service: General;  Laterality: Right;  . TONSILLECTOMY    . TUBAL LIGATION      FAMILY HISTORY Family History  Problem Relation Age of Onset  . Hypertension  Mother   . Brain cancer Mother   . Hypertension Sister   . Thyroid disease Sister   . Hypertension Sister   . Hypertension Brother   . Thyroid disease Brother   . Diabetes Maternal Grandmother   . Heart attack Maternal Grandmother   . Breast cancer Paternal Aunt     dx in her late 38s- early 83s  . Melanoma Paternal Aunt   . Breast cancer Paternal Aunt   . Multiple myeloma Paternal Aunt   . Other Daughter 67    ITP   the patient's father was diagnosed with squamous cell cancer of the throat at age 36 and died 72 years later. The patient's mother died from end-stage renal disease at age 30. The patient had one brother, 2 sisters. There is no cancer in the immediate family except as noted, but one of the patient's father's 5 sisters was diagnosed with breast cancer before the age of 57. Another 1 had multiple myeloma. On the patient's mother sides there is a history of brain tumor in a niece.   GYNECOLOGIC HISTORY:  Patient's last menstrual period was 11/21/2010. Menarche age 66, first live birth age 53. The patient is GX P2. She went through menopause in 2002 or thereabouts. She did not take hormone replacement. 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 Her husband Courtney Dunlap is a retired Clinical biochemist. Daughter Courtney Dunlap lives in [place] and is a housewife. Daughter "[name]" and Dunlap this in [place]. 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 The patient has 2 grandchildren and 3 great-grandchildren. She is a Psychologist, forensic.    ADVANCED DIRECTIVES: Not in place   HEALTH MAINTENANCE: Social History  Substance Use Topics  . Smoking status: Former Smoker    Years: 4.00    Types: Cigarettes  . Smokeless tobacco: Former Systems developer    Quit date: 05/15/1974     Comment: quit 35-40 years ago  .  Alcohol use No     Colonoscopy: ?1997  PAP: FEB 2016  Bone density: October 2016 at [facility], normal  Lipid panel:  Allergies  Allergen Reactions  . Codeine Swelling    Facial/lips  . Adhesive [Tape] Itching and Rash    MUST USE PAPER TAPE-CAUSES BLISTERS AND BRUISES ALSO  . Latex Itching and Rash    Current Outpatient Prescriptions  Medication Sig Dispense Refill  . acetaminophen (TYLENOL) 500 MG tablet Take 500 mg by mouth every 6 (six) hours as needed for pain.    [name] anastrozole (ARIMIDEX) 1 MG tablet Take 1 tablet (1 mg total) by mouth daily. 90 tablet 4  . atorvastatin (LIPITOR) 20 MG tablet Take 20 mg by mouth daily after breakfast.     . diclofenac sodium (VOLTAREN) 1 % GEL Apply 2 g topically as needed (FOR KNEES).     . hydrochlorothiazide (MICROZIDE) 12.5 MG capsule Take 1 capsule (12.5 mg total)  by mouth daily. 90 capsule 3  . metoprolol tartrate (LOPRESSOR) 25 MG tablet Take 1 tablet (25 mg total) by mouth 3 (three) times daily as needed (for palpations and heart rate greater than 140 per minute). 45 tablet 3  . Omega-3 Fatty Acids (FISH OIL) 1000 MG CAPS Take 1,000 mg by mouth daily.    . ranitidine (ZANTAC) 150 MG tablet Take 150 mg by mouth 2 (two) times daily as needed for heartburn.    . sotalol (BETAPACE) 80 MG tablet Take 1 tablet (80 mg total) by mouth 2 (two) times daily. 180 tablet 3  . traMADol (ULTRAM) 50 MG tablet Take 1 tablet (50 mg total) by mouth every 6 (six) hours as needed for moderate pain. 30 tablet 0  . vitamin B-12 (CYANOCOBALAMIN) 1000 MCG tablet Take 1,000 mcg by mouth daily.    . Vitamin D, Ergocalciferol, (DRISDOL) 50000 UNITS CAPS capsule Take 50,000 Units by mouth every 7 (seven) days. On Saturday    . warfarin (COUMADIN) 5 MG tablet Take 7.5 mg by mouth daily.      No current facility-administered medications for this visit.     OBJECTIVE: Middle-aged white woman Who appears stated age 20:   05/13/16 1610  BP: (!) 132/54    Pulse: 67  Resp: 18  Temp: 98.4 F (36.9 C)     Body mass index is 37.48 kg/m.    ECOG FS:1 - Symptomatic but completely ambulatory  Sclerae unicteric, pupils round and equal Oropharynx clear and moist-- no thrush or other lesions No cervical or supraclavicular adenopathy Lungs no rales or rhonchi Heart regular rate and rhythm Abd soft, obese, nontender, positive bowel sounds MSK no focal spinal tenderness, no upper extremity lymphedema Neuro: nonfocal, well oriented, appropriate affect Breasts: The right breast is status post lumpectomy and radiation. The skin is thickened and the force or noticeable as expected. There is a palpable seroma which is not tender and does not significantly distort the breast contour. The right axilla is benign. The left breast is unremarkable     LAB RESULTS:  CMP     Component Value Date/Time   NA 142 03/04/2016 1006   K 3.8 03/04/2016 1006   CL 102 10/23/2015 1017   CO2 28 03/04/2016 1006   GLUCOSE 96 03/04/2016 1006   BUN 13.9 03/04/2016 1006   CREATININE 0.8 03/04/2016 1006   CALCIUM 9.5 03/04/2016 1006   PROT 7.1 03/04/2016 1006   ALBUMIN 3.7 03/04/2016 1006   AST 26 03/04/2016 1006   ALT 27 03/04/2016 1006   ALKPHOS 115 03/04/2016 1006   BILITOT 0.93 03/04/2016 1006   GFRNONAA >60 07/11/2015 2116   GFRAA >60 07/11/2015 2116    INo results found for: SPEP, UPEP  Lab Results  Component Value Date   WBC 6.0 03/04/2016   NEUTROABS 3.8 03/04/2016   HGB 12.8 03/04/2016   HCT 38.1 03/04/2016   MCV 92.0 03/04/2016   PLT 169 03/04/2016      Chemistry      Component Value Date/Time   NA 142 03/04/2016 1006   K 3.8 03/04/2016 1006   CL 102 10/23/2015 1017   CO2 28 03/04/2016 1006   BUN 13.9 03/04/2016 1006   CREATININE 0.8 03/04/2016 1006      Component Value Date/Time   CALCIUM 9.5 03/04/2016 1006   ALKPHOS 115 03/04/2016 1006   AST 26 03/04/2016 1006   ALT 27 03/04/2016 1006   BILITOT 0.93 03/04/2016 1006  No results found for: LABCA2  No components found for: [id number]  No results for input(s): INR in the last 168 hours.  Urinalysis    Component Value Date/Time   BILIRUBINUR N 12/11/2015 1321   PROTEINUR N 12/11/2015 1321   UROBILINOGEN negative 12/11/2015 1321   NITRITE N 12/11/2015 1321   LEUKOCYTESUR Negative 12/11/2015 1321    STUDIES: Mamm[facility] right breast at [facility] 04/18/2016 showed a 5.3 cm oval fluid collection consistent with seroma. There was no evidence of malignancy.  ASSESSMENT: 65 y.o. [place] woman, status post right breast upper outer quadrant biopsy 04/18/2015 for a clinical T1b N0, stage IA invasive ductal carcinoma, grade 2, strongly estrogen and progesterone receptor positive, HER-2 not amplified, with an MIB-1 of 3%  (1) status post right lumpectomy and sentinel lymph node sampling 05/18/2015 for a  pT1c pN0, stage IA invasive ductal carcinoma, grade 1, repeat HER-2 again negative  (2) Oncotype DX score of 11 predicts an outside the breast recurrence within 10 years of 7% if the patient's only systemic therapy is tamoxifen for 5 years. It also predicts no benefit from chemotherapy  (3) adjuvant radiation completed December 2017 thank you  (4) genetics testing 05/11/2015 through the Breast/Ovarian gene panel offered by [facility] found no deleterious mutations in ATM, BARD1, BRCA1, BRCA2, BRIP1, CDH1, CHEK2, EPCAM, FANCC, MLH1, MSH2, MSH6, NBN, PALB2, PMS2, PTEN, RAD51C, RAD51D, TP53, and XRCC2.  (5) started anastrozole 08/12/2014  (a) DEXA scan at [facility] 05/30/2015 showed a T score of 1.0 (normal)  (b) anastrozole hed Jul;y 2017 due to arthralgias/ myalgias  (c) anastrozole resumed October 2017 as there was no change in symptoms off the drug  PLAN: Teasia noted no benefit in terms of symptoms going off the anastrozole, so we are going back on that medication. The plan will be to stay on it for a total of 5 years.  She is  very concerned about the fluid in her right breast. He just wants to be reassured that it is "okay". We did discuss that at length and she understands the options which include marsupialization. After much discussion she decided it does not bother her that much and she is willing to "wait and see".  She was alerted to let us know if pain or erythema develops over that area.  Also I am adding a right breast ultrasound to her next set of mammography September of next year.  As far as her knees are concerned I suggested water aerobics and other water exercises as well as the stationary bike.  She knows to call for any problems that may develop before her next visit  :Chauncey Cruel, MD   05/13/2016 4:27 PM Medical Oncology and Hematology [facility] [place], [place] [address] Tel. [phone]    Fax. [phone]

## 2016-05-25 ENCOUNTER — Encounter (HOSPITAL_COMMUNITY): Payer: Self-pay

## 2016-05-25 ENCOUNTER — Inpatient Hospital Stay (HOSPITAL_COMMUNITY)
Admission: EM | Admit: 2016-05-25 | Discharge: 2016-05-28 | DRG: 274 | Disposition: A | Discharge status: Home / Self Care | Payer: BC Managed Care – PPO | Attending: Internal Medicine | Admitting: Internal Medicine

## 2016-05-25 DIAGNOSIS — I4510 Unspecified right bundle-branch block: Secondary | ICD-10-CM | POA: Diagnosis not present

## 2016-05-25 DIAGNOSIS — Z808 Family history of malignant neoplasm of other organs or systems: Secondary | ICD-10-CM

## 2016-05-25 DIAGNOSIS — E669 Obesity, unspecified: Secondary | ICD-10-CM | POA: Diagnosis present

## 2016-05-25 DIAGNOSIS — I484 Atypical atrial flutter: Secondary | ICD-10-CM | POA: Diagnosis not present

## 2016-05-25 DIAGNOSIS — Z807 Family history of other malignant neoplasms of lymphoid, hematopoietic and related tissues: Secondary | ICD-10-CM | POA: Diagnosis not present

## 2016-05-25 DIAGNOSIS — I4892 Unspecified atrial flutter: Secondary | ICD-10-CM

## 2016-05-25 DIAGNOSIS — Z87891 Personal history of nicotine dependence: Secondary | ICD-10-CM | POA: Diagnosis not present

## 2016-05-25 DIAGNOSIS — Z833 Family history of diabetes mellitus: Secondary | ICD-10-CM

## 2016-05-25 DIAGNOSIS — Z803 Family history of malignant neoplasm of breast: Secondary | ICD-10-CM | POA: Diagnosis not present

## 2016-05-25 DIAGNOSIS — C50411 Malignant neoplasm of upper-outer quadrant of right female breast: Secondary | ICD-10-CM | POA: Diagnosis present

## 2016-05-25 DIAGNOSIS — Z8249 Family history of ischemic heart disease and other diseases of the circulatory system: Secondary | ICD-10-CM

## 2016-05-25 DIAGNOSIS — R000 Tachycardia, unspecified: Secondary | ICD-10-CM | POA: Diagnosis not present

## 2016-05-25 DIAGNOSIS — I471 Supraventricular tachycardia, unspecified: Secondary | ICD-10-CM | POA: Diagnosis present

## 2016-05-25 DIAGNOSIS — Z6836 Body mass index (BMI) 36.0-36.9, adult: Secondary | ICD-10-CM | POA: Diagnosis not present

## 2016-05-25 DIAGNOSIS — E785 Hyperlipidemia, unspecified: Secondary | ICD-10-CM | POA: Diagnosis present

## 2016-05-25 DIAGNOSIS — K219 Gastro-esophageal reflux disease without esophagitis: Secondary | ICD-10-CM | POA: Diagnosis present

## 2016-05-25 DIAGNOSIS — E7800 Pure hypercholesterolemia, unspecified: Secondary | ICD-10-CM | POA: Diagnosis not present

## 2016-05-25 DIAGNOSIS — G4733 Obstructive sleep apnea (adult) (pediatric): Secondary | ICD-10-CM | POA: Diagnosis present

## 2016-05-25 DIAGNOSIS — Z79899 Other long term (current) drug therapy: Secondary | ICD-10-CM | POA: Diagnosis not present

## 2016-05-25 DIAGNOSIS — I10 Essential (primary) hypertension: Secondary | ICD-10-CM | POA: Diagnosis not present

## 2016-05-25 DIAGNOSIS — Z7901 Long term (current) use of anticoagulants: Secondary | ICD-10-CM | POA: Diagnosis not present

## 2016-05-25 DIAGNOSIS — Z853 Personal history of malignant neoplasm of breast: Secondary | ICD-10-CM

## 2016-05-25 DIAGNOSIS — Z170 Estrogen receptor positive status [ER+]: Secondary | ICD-10-CM | POA: Diagnosis present

## 2016-05-25 DIAGNOSIS — I483 Typical atrial flutter: Secondary | ICD-10-CM | POA: Diagnosis not present

## 2016-05-25 DIAGNOSIS — I480 Paroxysmal atrial fibrillation: Secondary | ICD-10-CM | POA: Diagnosis not present

## 2016-05-25 DIAGNOSIS — Z8349 Family history of other endocrine, nutritional and metabolic diseases: Secondary | ICD-10-CM

## 2016-05-25 DIAGNOSIS — E876 Hypokalemia: Secondary | ICD-10-CM | POA: Diagnosis present

## 2016-05-25 DIAGNOSIS — I4819 Other persistent atrial fibrillation: Secondary | ICD-10-CM | POA: Diagnosis present

## 2016-05-25 HISTORY — DX: Unspecified atrial flutter: I48.92

## 2016-05-25 LAB — T4, FREE
FREE T4: 1.34 ng/dL — AB (ref 0.61–1.12)
Free T4: 1.34 ng/dL — ABNORMAL HIGH (ref 0.61–1.12)

## 2016-05-25 LAB — TROPONIN I
Troponin I: <0.03 ng/mL (ref <0.03)
Troponin I: 0.41 ng/mL (ref <0.03)
Troponin I: 0.42 ng/mL (ref <0.03)

## 2016-05-25 LAB — CBC WITH DIFFERENTIAL/PLATELET
BASOS ABS: 0 10*3/uL (ref 0.0–0.1)
BASOS PCT: 0 %
Eosinophils Absolute: 0.2 10*3/uL (ref 0.0–0.7)
Eosinophils Relative: 2 %
HEMATOCRIT: 41.3 % (ref 36.0–46.0)
HEMOGLOBIN: 14.2 g/dL (ref 12.0–15.0)
LYMPHS PCT: 18 %
Lymphs Abs: 1.6 10*3/uL (ref 0.7–4.0)
MCH: 31.1 pg (ref 26.0–34.0)
MCHC: 34.4 g/dL (ref 30.0–36.0)
MCV: 90.6 fL (ref 78.0–100.0)
MONOS PCT: 10 %
Monocytes Absolute: 0.9 10*3/uL (ref 0.1–1.0)
NEUTROS ABS: 6.2 10*3/uL (ref 1.7–7.7)
NEUTROS PCT: 70 %
Platelets: 240 10*3/uL (ref 150–400)
RBC: 4.56 MIL/uL (ref 3.87–5.11)
RDW: 12.6 % (ref 11.5–15.5)
WBC: 8.9 10*3/uL (ref 4.0–10.5)

## 2016-05-25 LAB — COMPREHENSIVE METABOLIC PANEL
ALBUMIN: 3.6 g/dL (ref 3.5–5.0)
ALK PHOS: 119 U/L (ref 38–126)
ALT: 21 U/L (ref 14–54)
AST: 27 U/L (ref 15–41)
Anion gap: 13 (ref 5–15)
BILIRUBIN TOTAL: 1.1 mg/dL (ref 0.3–1.2)
BUN: 11 mg/dL (ref 6–20)
CALCIUM: 9.3 mg/dL (ref 8.9–10.3)
CO2: 22 mmol/L (ref 22–32)
CREATININE: 0.71 mg/dL (ref 0.44–1.00)
Chloride: 100 mmol/L — ABNORMAL LOW (ref 101–111)
GFR calc Af Amer: >60 mL/min (ref >60)
GFR calc non Af Amer: >60 mL/min (ref >60)
GLUCOSE: 132 mg/dL — AB (ref 65–99)
Potassium: 3.4 mmol/L — ABNORMAL LOW (ref 3.5–5.1)
Sodium: 135 mmol/L (ref 135–145)
Total Protein: 6.8 g/dL (ref 6.5–8.1)

## 2016-05-25 LAB — URINALYSIS, ROUTINE W REFLEX MICROSCOPIC
Bilirubin Urine: NEGATIVE
Glucose, UA: NEGATIVE mg/dL
Hgb urine dipstick: NEGATIVE
KETONES UR: NEGATIVE mg/dL
LEUKOCYTES UA: NEGATIVE
NITRITE: NEGATIVE
PH: 7.5 (ref 5.0–8.0)
Protein, ur: NEGATIVE mg/dL
SPECIFIC GRAVITY, URINE: 1.008 (ref 1.005–1.030)

## 2016-05-25 LAB — PROTIME-INR
INR: 1.79
PROTHROMBIN TIME: 21 s — AB (ref 11.4–15.2)

## 2016-05-25 LAB — TSH: TSH: 0.449 u[IU]/mL (ref 0.350–4.500)

## 2016-05-25 LAB — MAGNESIUM: MAGNESIUM: 1.9 mg/dL (ref 1.7–2.4)

## 2016-05-25 MED ORDER — METOPROLOL TARTRATE 5 MG/5ML IV SOLN
5.0000 mg | Freq: Once | INTRAVENOUS | Status: AC
Start: 2016-05-25 — End: 2016-05-25
  Administered 2016-05-25: 5 mg via INTRAVENOUS

## 2016-05-25 MED ORDER — METOPROLOL TARTRATE 5 MG/5ML IV SOLN
INTRAVENOUS | Status: AC
  Administered 2016-05-25: 5 mg via INTRAVENOUS
  Filled 2016-05-25: qty 5

## 2016-05-25 MED ORDER — MIDAZOLAM HCL 2 MG/2ML IJ SOLN
INTRAMUSCULAR | Status: AC
  Filled 2016-05-25: qty 4

## 2016-05-25 MED ORDER — METOPROLOL TARTRATE 25 MG PO TABS
25.0000 mg | ORAL_TABLET | Freq: Three times a day (TID) | ORAL | Status: DC
  Administered 2016-05-25 – 2016-05-27 (×5): 25 mg via ORAL
  Filled 2016-05-25 (×5): qty 1

## 2016-05-25 MED ORDER — VITAMIN B-12 1000 MCG PO TABS
1000.0000 ug | ORAL_TABLET | Freq: Every day | ORAL | Status: DC
  Administered 2016-05-26 – 2016-05-28 (×3): 1000 ug via ORAL
  Filled 2016-05-25 (×3): qty 1

## 2016-05-25 MED ORDER — OMEGA-3-ACID ETHYL ESTERS 1 G PO CAPS
1.0000 g | ORAL_CAPSULE | Freq: Every day | ORAL | Status: DC
  Administered 2016-05-26 – 2016-05-28 (×3): 1 g via ORAL
  Filled 2016-05-25 (×3): qty 1

## 2016-05-25 MED ORDER — WARFARIN SODIUM 10 MG PO TABS
10.0000 mg | ORAL_TABLET | Freq: Once | ORAL | Status: AC
  Administered 2016-05-25: 10 mg via ORAL
  Filled 2016-05-25: qty 1

## 2016-05-25 MED ORDER — ANASTROZOLE 1 MG PO TABS
1.0000 mg | ORAL_TABLET | Freq: Every day | ORAL | Status: DC
  Administered 2016-05-26 – 2016-05-28 (×3): 1 mg via ORAL
  Filled 2016-05-25 (×3): qty 1

## 2016-05-25 MED ORDER — VITAMIN D (ERGOCALCIFEROL) 1.25 MG (50000 UNIT) PO CAPS: 50000.0000 [IU] | ORAL_CAPSULE | ORAL | Status: DC

## 2016-05-25 MED ORDER — WARFARIN - PHARMACIST DOSING INPATIENT: Freq: Every day | Status: DC

## 2016-05-25 MED ORDER — ACETAMINOPHEN 325 MG PO TABS
650.0000 mg | ORAL_TABLET | ORAL | Status: DC | PRN
  Administered 2016-05-27 (×2): 650 mg via ORAL
  Filled 2016-05-25 (×3): qty 2

## 2016-05-25 MED ORDER — MIDAZOLAM HCL 5 MG/5ML IJ SOLN
INTRAMUSCULAR | Status: AC | PRN
  Administered 2016-05-25: 4 mg via INTRAVENOUS

## 2016-05-25 MED ORDER — ONDANSETRON HCL 4 MG/2ML IJ SOLN: 4.0000 mg | Freq: Four times a day (QID) | INTRAMUSCULAR | Status: DC | PRN

## 2016-05-25 MED ORDER — HYDROCHLOROTHIAZIDE 12.5 MG PO CAPS
12.5000 mg | ORAL_CAPSULE | Freq: Every day | ORAL | Status: DC
  Administered 2016-05-26 – 2016-05-28 (×3): 12.5 mg via ORAL
  Filled 2016-05-25 (×3): qty 1

## 2016-05-25 MED ORDER — ATORVASTATIN CALCIUM 20 MG PO TABS
20.0000 mg | ORAL_TABLET | Freq: Every day | ORAL | Status: DC
  Administered 2016-05-26 – 2016-05-28 (×3): 20 mg via ORAL
  Filled 2016-05-25 (×3): qty 1

## 2016-05-25 NOTE — Progress Notes (Signed)
Pt troponin value is 0.41 , MD notified. Next drawing due at 05/26/16 at 0520

## 2016-05-25 NOTE — ED Triage Notes (Signed)
[facility]- pt arrives with EMS after she called out for chest pressure, she was initially in SVT (hx of same), pt also has hx of a-fib. Pt had 6mg  of adenosine prior to arrival and converted to SVT en route. Amio drip hanging. Pt is alert and oriented on arrival.

## 2016-05-25 NOTE — Progress Notes (Signed)
[place] for warfarin Indication: atrial fibrillation  Allergies  Allergen Reactions  . Codeine Swelling    Facial/lips  . Adhesive [Tape] Itching and Rash    MUST USE PAPER TAPE-CAUSES BLISTERS AND BRUISES ALSO  . Latex Itching and Rash    Patient Measurements: Height: 5\' 2"  (157.5 cm) Weight: 199 lb 6.4 oz (90.4 kg) IBW/kg (Calculated) : 50.1 Heparin Dosing Weight:   Vital Signs: Temp: 98.4 F (36.9 C) (10/14 1713) Temp Source: Oral (10/14 1713) BP: 157/81 (10/14 1713) Pulse Rate: 75 (10/14 1713)  Labs:  Recent Labs  05/25/16 1345  HGB 14.2  HCT 41.3  PLT 240  CREATININE 0.71  TROPONINI <0.03    Estimated Creatinine Clearance: 73.3 mL/min (by C-G formula based on SCr of 0.71 mg/dL).   Medical History: Past Medical History:  Diagnosis Date  . A-fib ([place])   . Allergic dermatitis   . Anxiety   . Arthritis    back, knees & ankles   . Breast cancer ([place])   . Cancer ([place])    skin- lower abdomen  . Depression   . GERD (gastroesophageal reflux disease)   . Hiatal hernia    with GERD  . Hypercholesteremia   . Hypertension   . Hyperthyroidism    pt. reports that she thinks she has hypothyroidism, states MD- [place] took her off med. in the past 1-2 yrs.   . Mild cognitive impairment with memory loss   . Morbid obesity ([place])   . Sleep apnea    severe with AHI 37/hr now on CPAP at 13cm H2O  . SVT (supraventricular tachycardia) (HCC)      Assessment: Pt is on warfarin PTA for hx of AFib. INR 1.79, last dose last night PTA dose: 7.5 mg po daily  Goal of Therapy:  INR 2-3 Monitor platelets by anticoagulation protocol: Yes   Plan:  -Warfarin 10 mg po x1 -Daily INR  Harvel Quale 05/25/2016,5:48 PM

## 2016-05-25 NOTE — H&P (Signed)
Physician History and Physical    Patient ID: Courtney Dunlap MRN: [id number] DOB/AGE: [birth year] 65 y.o. Admit date: 05/25/2016  Primary Care Physician: [name], MD Primary Cardiologist Fransico Him MD EP: Thompson Grayer MD  HPI: Pleasant 65 yo WF with history of AFib/flutter and SVT presents with symptoms of refractory tachycardia. She was in her usual state of health and doing some light yard work today when she developed chest pain and tachycardia. EMS was called and she was found to be in a narrow complex tachycardia with rate 220 bpm. She was given IV adenosine with some slowing and development of RBBB aberrancy. Review of strips suggests underlying atrial flutter. Rate then again increased with rate 214 with LBBB morphology. IV amiodarone 150 mg was given and IV lopressor without effect. Patient became hypotensive and was cardioverted in the ED. In NSR patient has a new RBBB aberrancy. She is mildly sedated but feeling much better. She has a history of afib and flutter. She has been on chronic Sotalol therapy and Coumadin. She had episodes of sustained tachycardia in August and November 2016. In November she had an atrial flutter with RBBB aberrancy. Denies caffeine use. No recent illnesses. Myoview study in September 2016 was normal. Last Echo in 2013 showed normal EF with diastolic dysfunction and mild pulmonary HTN.   Review of systems complete and found to be negative unless listed above  Past Medical History:  Diagnosis Date  . A-fib ([place])   . Allergic dermatitis   . Anxiety   . Arthritis    back, knees & ankles   . Breast cancer ([place])   . Cancer ([place])    skin- lower abdomen  . Depression   . GERD (gastroesophageal reflux disease)   . Hiatal hernia    with GERD  . Hypercholesteremia   . Hypertension   . Hyperthyroidism    pt. reports that she thinks she has hypothyroidism, states MD- [place] took her off med. in the past 1-2 yrs.   . Mild cognitive impairment with  memory loss   . Morbid obesity ([place])   . Sleep apnea    severe with AHI 37/hr now on CPAP at 13cm H2O  . SVT (supraventricular tachycardia) (HCC)     Family History  Problem Relation Age of Onset  . Hypertension Mother   . Brain cancer Mother   . Hypertension Sister   . Thyroid disease Sister   . Hypertension Sister   . Hypertension Brother   . Thyroid disease Brother   . Diabetes Maternal Grandmother   . Heart attack Maternal Grandmother   . Breast cancer Paternal Aunt     dx in her late 40s- early 77s  . Melanoma Paternal Aunt   . Breast cancer Paternal Aunt   . Multiple myeloma Paternal Aunt   . Other Daughter 29    ITP    Social History   Social History  . Marital status: Married    Spouse name: N/A  . Number of children: 2  . Years of education: N/A   Occupational History  .  [employer] History Main Topics  . Smoking status: Former Smoker    Years: 4.00    Types: Cigarettes  . Smokeless tobacco: Former Systems developer    Quit date: 05/15/1974     Comment: quit 35-40 years ago  . Alcohol use No  . Drug use: No  . Sexual activity: Not Currently    Birth control/ protection: Post-menopausal  Other Topics Concern  . Not on file   Social History Narrative  . No narrative on file    Past Surgical History:  Procedure Laterality Date  . APPENDECTOMY  28 years ago  . DILATION AND CURETTAGE OF UTERUS  1996    Polyp resection   . HYSTEROSCOPY  4/12   D&C  . RADIOACTIVE SEED GUIDED MASTECTOMY WITH AXILLARY SENTINEL LYMPH NODE BIOPSY Right 05/18/2015   Procedure: RIGHT BREAST RADIOACTIVE SEED PARTIAL MASTECTOMY WITH RIGHT SENTINEL LYMPH NODE MAPPING;  Surgeon: Erroll Luna, MD;  Location: [facility];  Service: General;  Laterality: Right;  . TONSILLECTOMY    . TUBAL LIGATION        (Not in a hospital admission)  Physical Exam: Blood pressure 141/89, pulse 68, resp. rate 18, height '5\' 2"'  (1.575 m), weight 204 lb (92.5 kg), last menstrual period  11/21/2010, SpO2 91 %. Current Weight  05/25/16 204 lb (92.5 kg)  05/13/16 204 lb 14.4 oz (92.9 kg)  05/02/16 204 lb 12.8 oz (92.9 kg)    GENERAL:  Well appearing 65 WF mildly sedated  HEENT:  PERRL, EOMI, sclera are clear. Oropharynx is clear. NECK:  No jugular venous distention, carotid upstroke brisk and symmetric, no bruits, no thyromegaly or adenopathy LUNGS:  Clear to auscultation bilaterally CHEST:  Unremarkable HEART:  RRR,  PMI not displaced or sustained,S1 and S2 within normal limits, no S3, no S4: no clicks, no rubs, no murmurs ABD:  Soft, nontender. BS +, no masses or bruits. No hepatomegaly, no splenomegaly EXT:  2 + pulses throughout, no edema, no cyanosis no clubbing SKIN:  Warm and dry.  No rashes NEURO:  Alert and oriented x 3. Cranial nerves II through XII intact. PSYCH:  Cognitively intact    Labs:   Lab Results  Component Value Date   WBC 6.0 03/04/2016   HGB 12.8 03/04/2016   HCT 38.1 03/04/2016   MCV 92.0 03/04/2016   PLT 169 03/04/2016   No results for input(s): NA, K, CL, CO2, BUN, CREATININE, CALCIUM, PROT, BILITOT, ALKPHOS, ALT, AST, GLUCOSE in the last 168 hours.  Invalid input(s): LABALBU Lab Results  Component Value Date   CKMB 3.5 08/16/2011   CKMB 4.4 (H) 08/16/2011   CKMB 5.3 (H) 08/16/2011   TROPONINI 0.42 (HH) 08/16/2011   TROPONINI 0.52 (HH) 08/16/2011   TROPONINI 0.65 (HH) 08/16/2011   No results found for: CHOL No results found for: HDL No results found for: LDLCALC No results found for: TRIG No results found for: CHOLHDL No results found for: LDLDIRECT  No results found for: PROBNP Lab Results  Component Value Date   TSH 1.060 08/16/2011   No results found for: HGBA1C  Radiology: No results found.  EKG: as noted in HPI. SVT initially narrow complex. Slowed with adenosine with RBBB aberrancy. Then accelerated with LBBB aberrancy. Now post CV NSR with new RBBB.   ASSESSMENT AND PLAN:  1. SVT most likely atrial flutter  with aberrancy. Different morphologies with RBBB and LBBB are concerning. This may suggest multiple rhythm abnormalities. Will admit today. Monitor. Strips reviewed with Dr. Rayann Heman. Will hold Sotalol now and plan possible EP study this week. Continue coumadin. Check baseline labs, TSH, free T4, serial troponin. Check Echo. 2. OSA on CPAP 3. HTN 4. Hyperlipidemia. 5. History of breast CA.   Signed: Peter Dunlap, [place]  05/25/2016, 2:42 PM

## 2016-05-25 NOTE — ED Notes (Signed)
Called 2W to give report and unable to take.

## 2016-05-26 DIAGNOSIS — I480 Paroxysmal atrial fibrillation: Secondary | ICD-10-CM

## 2016-05-26 LAB — PROTIME-INR
INR: 1.93
PROTHROMBIN TIME: 22.3 s — AB (ref 11.4–15.2)

## 2016-05-26 LAB — TROPONIN I: Troponin I: 0.23 ng/mL (ref <0.03)

## 2016-05-26 MED ORDER — SODIUM CHLORIDE 0.9 % IV SOLN: INTRAVENOUS | Status: DC

## 2016-05-26 MED ORDER — WARFARIN SODIUM 10 MG PO TABS
10.0000 mg | ORAL_TABLET | Freq: Once | ORAL | Status: AC
  Administered 2016-05-26: 10 mg via ORAL
  Filled 2016-05-26: qty 1

## 2016-05-26 NOTE — Consult Note (Signed)
ELECTROPHYSIOLOGY CONSULT NOTE    Primary Care Physician: [name], MD Referring Physician:  Dr Dunlap  Admit Date: 05/25/2016  Reason for consultation: SVT  Courtney Dunlap is a 65 y.o. female with a h/o afib, atrial flutter, and SVT who is well known to me.  She is now admitted with rapid SVT.  She reports being in her usual state of health until yesterday when she developed abrupt onset of tachypalpitations.  She was evaluated by EMS and noted to have a narrow complex SVT at 220 bpm.  She received adenosine and then the tachycardia aberrated and converted to a wide complex tachycardia at the same rate.  She presented to [facility] where she was given an amiodarone bolus and converted to sinus rhythm.  She reports feeling "washed out" since the episode.  She reports compliance with sotalol.  She has a h/o SVT and reports occasional episodes of short palpitations which will occur with bending or stretching.  She has not had afib since 2013.   Today she is doing "better".  She denies symptoms of palpitations, chest pain, shortness of breath, orthopnea, PND, lower extremity edema, dizziness, presyncope, syncope, or neurologic sequela. The patient is tolerating medications without difficulties and is otherwise without complaint today.   Past Medical History:  Diagnosis Date  . A-fib ([place])   . Allergic dermatitis   . Anxiety   . Arthritis    back, knees & ankles   . Breast cancer ([place])   . Cancer ([place])    skin- lower abdomen  . Depression   . GERD (gastroesophageal reflux disease)   . Hiatal hernia    with GERD  . Hypercholesteremia   . Hypertension   . Hyperthyroidism    pt. reports that she thinks she has hypothyroidism, states MD- [place] took her off med. in the past 1-2 yrs.   . Mild cognitive impairment with memory loss   . Morbid obesity ([place])   . Sleep apnea    severe with AHI 37/hr now on CPAP at 13cm H2O  . SVT (supraventricular tachycardia) (HCC)    Past  Surgical History:  Procedure Laterality Date  . APPENDECTOMY  28 years ago  . DILATION AND CURETTAGE OF UTERUS  1996    Polyp resection   . HYSTEROSCOPY  4/12   D&C  . RADIOACTIVE SEED GUIDED MASTECTOMY WITH AXILLARY SENTINEL LYMPH NODE BIOPSY Right 05/18/2015   Procedure: RIGHT BREAST RADIOACTIVE SEED PARTIAL MASTECTOMY WITH RIGHT SENTINEL LYMPH NODE MAPPING;  Surgeon: Erroll Luna, MD;  Location: [facility];  Service: General;  Laterality: Right;  . TONSILLECTOMY    . TUBAL LIGATION      . anastrozole  1 mg Oral Daily  . atorvastatin  20 mg Oral QPC breakfast  . hydrochlorothiazide  12.5 mg Oral Daily  . metoprolol tartrate  25 mg Oral TID  . omega-3 acid ethyl esters  1 g Oral Daily  . vitamin B-12  1,000 mcg Oral Daily  . [START ON 06/01/2016] Vitamin D (Ergocalciferol)  50,000 Units Oral Q7 days  . Warfarin - Pharmacist Dosing Inpatient   Does not apply q1800      Allergies  Allergen Reactions  . Codeine Swelling    Facial/lips  . Adhesive [Tape] Itching and Rash    MUST USE PAPER TAPE-CAUSES BLISTERS AND BRUISES ALSO  . Latex Itching and Rash    Social History   Social History  . Marital status: Married    Spouse name: N/A  .  Number of children: 2  . Years of education: N/A   Occupational History  .  [place] History Main Topics  . Smoking status: Former Smoker    Years: 4.00    Types: Cigarettes  . Smokeless tobacco: Former Systems developer    Quit date: 05/15/1974     Comment: quit 35-40 years ago  . Alcohol use No  . Drug use: No  . Sexual activity: Not Currently    Birth control/ protection: Post-menopausal   Other Topics Concern  . Not on file   Social History Narrative  . No narrative on file    Family History  Problem Relation Age of Onset  . Hypertension Mother   . Brain cancer Mother   . Hypertension Sister   . Thyroid disease Sister   . Hypertension Sister   . Hypertension Brother   . Thyroid disease Brother   . Diabetes  Maternal Grandmother   . Heart attack Maternal Grandmother   . Breast cancer Paternal Aunt     dx in her late 55s- early 61s  . Melanoma Paternal Aunt   . Breast cancer Paternal Aunt   . Multiple myeloma Paternal Aunt   . Other Daughter 6    ITP    ROS- All systems are reviewed and negative except as per the HPI above  Physical Exam: Telemetry: sinus rhythm Vitals:   05/25/16 1615 05/25/16 1713 05/25/16 2022 05/26/16 0500  BP: 149/72 (!) 157/81 109/62 134/63  Pulse: 75 75 63 63  Resp:  _0 Temp:  98.4 F (36.9 C) 98.1 F (36.7 C) 98.2 F (36.8 C)  TempSrc:  Oral Axillary Oral  SpO2: 100% 100% 97% 97%  Weight:  199 lb 6.4 oz (90.4 kg)    Height:        GEN- The patient is overweight appearing, alert and oriented x 3 today.   Head- normocephalic, atraumatic Eyes-  Sclera clear, conjunctiva pink Ears- hearing intact Oropharynx- clear Neck- supple, no JVP Lymph- no cervical lymphadenopathy Lungs- Clear to ausculation bilaterally, normal work of breathing Heart- Regular rate and rhythm, no murmurs, rubs or gallops, PMI not laterally displaced GI- soft, NT, ND, + BS Extremities- no clubbing, cyanosis, or edema MS- no significant deformity or atrophy Skin- no rash or lesion Psych- euthymic mood, full affect Neuro- strength and sensation are intact  EKGs are reviewed in epic  Labs:   Lab Results  Component Value Date   WBC 8.9 05/25/2016   HGB 14.2 05/25/2016   HCT 41.3 05/25/2016   MCV 90.6 05/25/2016   PLT 240 05/25/2016    Recent Labs Lab 05/25/16 1345  NA 135  K 3.4*  CL 100*  CO2 22  BUN 11  CREATININE 0.71  CALCIUM 9.3  PROT 6.8  BILITOT 1.1  ALKPHOS 119  ALT 21  AST 27  GLUCOSE 132*   Lab Results  Component Value Date   CKTOTAL 60 08/16/2011   CKMB 3.5 08/16/2011   TROPONINI 0.23 ([place]) 05/26/2016   No results found for: CHOL No results found for: HDL No results found for: LDLCALC No results found for: TRIG No results found for:  CHOLHDL No results found for: LDLDIRECT     Echo: pending  ASSESSMENT AND PLAN:   1. SVT/ atrial flutter The patient has a h/o SVT and atrial flutter.  Her presenting rhythm was a narrow complex tachycardia at 220 bpm.  With adenosine, it appears that there may be flutter  waves that are revealed with about the same cycle length.  She then develops a wide complex tachycardia of the same cylcle length for which she received IV amiodarone.  I suspect that she may have had 1:1 atrial flutter with QRS widening sp adenosine.  I cannot completely rule out adenosine induced VT though given same cycle length as SVT, this would seem much less likely.  Her history suggests that she may also have had prior reentrant SVT.  EPS could be helpful in evaluating for both.  She has failed medical therapy with sotalol. Therapeutic strategies for supraventricular tachycardia including medicine and ablation were discussed in detail with the patient today. Risk, benefits, and alternatives to EP study and radiofrequency ablation were also discussed in detail today. These risks include but are not limited to stroke, bleeding, vascular damage, tamponade, perforation, damage to the heart and other structures, AV block requiring pacemaker, worsening renal function, and death. The patient understands these risk and wishes to proceed.  Dr Lovena Le may be able to perform the procedure tomorrow.  I will give clear liquid breakfast and place tentatively on his schedule.  2. Paroxysmal atrial fibrillation No symptoms of Afib since 2013.  She is on coumadin for cahds2score of 3.  She was subtherapeutic on admit.  I have previously (and again today) advised DOAC therapy.  She would prefer to continue coumadin for now.  Echo pending  3. Obesity Body mass index is 36.47 kg/m. Weight loss advised  4. HTN Stable No change required today  5. OSA Compliance with CPAP advised  Hopefully to have EPS and RFA by Dr Lovena Le tomorrow.  Will  therefore keep in the hospital at this time.   Thompson Grayer, MD 05/26/2016  9:16 AM

## 2016-05-26 NOTE — Progress Notes (Signed)
ANTICOAGULATION CONSULT NOTE - Follow Up Consult  Pharmacy Consult for Coumadin Indication: afib/flutter  Allergies  Allergen Reactions  . Codeine Swelling    Facial/lips  . Adhesive [Tape] Itching and Rash    MUST USE PAPER TAPE-CAUSES BLISTERS AND BRUISES ALSO  . Latex Itching and Rash    Patient Measurements: Height: 5\' 2"  (157.5 cm) Weight: 199 lb 6.4 oz (90.4 kg) IBW/kg (Calculated) : 50.1  Vital Signs: Temp: 98.2 F (36.8 C) (10/15 0500) Temp Source: Oral (10/15 0500) BP: 134/63 (10/15 0500) Pulse Rate: 63 (10/15 0500)  Labs:  Recent Labs  05/25/16 1345 05/25/16 1818 05/25/16 2315 05/26/16 0458  HGB 14.2  --   --   --   HCT 41.3  --   --   --   PLT 240  --   --   --   LABPROT  --  21.0*  --  22.3*  INR  --  1.79  --  1.93  CREATININE 0.71  --   --   --   TROPONINI <0.03 0.41* 0.42* 0.23*    Estimated Creatinine Clearance: 73.3 mL/min (by C-G formula based on SCr of 0.71 mg/dL).   Assessment:  Anticoag: warfarin pta for AFib/flutter, admit INR 1.79. CBC WNL. Today INR 1.93. CHADS2VASC 3.  -PTA wafarin: 7.5 mg po daily - MD notes previously (and again today) advised DOAC therapy.  She would prefer to continue coumadin for now.   Goal of Therapy:  INR 2-3 Monitor platelets by anticoagulation protocol: Yes   Plan:  Repeat Coumadin 10mg  po x 1 again tonight. Daily INR   Desani Sprung S. Alford Highland, PharmD, BCPS Clinical Staff Pharmacist Pager [phone]  Eilene Ghazi [place] 05/26/2016,12:05 PM

## 2016-05-26 NOTE — ED Provider Notes (Signed)
[facility] DEPT Provider Note   CSN: [id number] Arrival date & time: 05/25/16  1329     History   Chief Complaint Chief Complaint  Patient presents with  . Tachycardia    HPI Courtney Dunlap is a 65 y.o. female.  HPI Patient with history of SVT and atrial fibrillation on Coumadin and followed by Dr. Radford Pax resents with acute onset heart palpitations starting around noon. Associated with lightheadedness, chest pressure and mild shortness of breath. When EMS arrived patient found to be in narrow complex tachycardia consistent with SVT. Was given 6 mg of adenosine with conversion of rhythm to wide-complex tachycardia with a rate roughly 220 beats per minute. Patient was then given 150 mg of amiodarone for possible ventricular tachycardia. Maintain distal pulses.  Past Medical History:  Diagnosis Date  . A-fib ([place])   . Allergic dermatitis   . Anxiety   . Arthritis    back, knees & ankles   . Breast cancer ([place])   . Cancer ([place])    skin- lower abdomen  . Depression   . GERD (gastroesophageal reflux disease)   . Hiatal hernia    with GERD  . Hypercholesteremia   . Hypertension   . Hyperthyroidism    pt. reports that she thinks she has hypothyroidism, states MD- [place] took her off med. in the past 1-2 yrs.   . Mild cognitive impairment with memory loss   . Morbid obesity ([place])   . Sleep apnea    severe with AHI 37/hr now on CPAP at 13cm H2O  . SVT (supraventricular tachycardia) [facility])     Patient Active Problem List   Diagnosis Date Noted  . Atrial flutter ([place]) 05/25/2016  . SVT (supraventricular tachycardia) ([place]) 05/02/2016  . Morbid obesity ([place]) 07/24/2015  . Genetic testing 05/12/2015  . Family history of breast cancer   . Breast cancer of upper-outer quadrant of right female breast ([place]) 04/24/2015  . Atrial fibrillation ([place]) 02/04/2014  . Benign essential HTN 02/04/2014  . OSA (obstructive sleep apnea) 02/04/2014  . Dyslipidemia 08/16/2011    Past  Surgical History:  Procedure Laterality Date  . APPENDECTOMY  28 years ago  . DILATION AND CURETTAGE OF UTERUS  1996    Polyp resection   . HYSTEROSCOPY  4/12   D&C  . RADIOACTIVE SEED GUIDED MASTECTOMY WITH AXILLARY SENTINEL LYMPH NODE BIOPSY Right 05/18/2015   Procedure: RIGHT BREAST RADIOACTIVE SEED PARTIAL MASTECTOMY WITH RIGHT SENTINEL LYMPH NODE MAPPING;  Surgeon: Erroll Luna, MD;  Location: [facility];  Service: General;  Laterality: Right;  . TONSILLECTOMY    . TUBAL LIGATION      OB History    Gravida Para Term Preterm AB Living   '2 2 2     2   ' SAB TAB Ectopic Multiple Live Births                   Home Medications    Prior to Admission medications   Medication Sig Start Date End Date Taking? Authorizing Provider  acetaminophen (TYLENOL) 500 MG tablet Take 500 mg by mouth every 6 (six) hours as needed for pain.   Yes Historical Provider, MD  anastrozole (ARIMIDEX) 1 MG tablet Take 1 tablet (1 mg total) by mouth daily. 05/13/16  Yes Chauncey Cruel, MD  atorvastatin (LIPITOR) 20 MG tablet Take 20 mg by mouth daily after breakfast.  07/27/13  Yes Historical Provider, MD  hydrochlorothiazide (MICROZIDE) 12.5 MG capsule Take 1 capsule (12.5 mg total) by  mouth daily. 04/27/15  Yes Sueanne Margarita, MD  metoprolol tartrate (LOPRESSOR) 25 MG tablet Take 1 tablet (25 mg total) by mouth 3 (three) times daily as needed (for palpations and heart rate greater than 140 per minute). Patient taking differently: Take 25 mg by mouth 3 (three) times daily.  08/02/15  Yes Thompson Grayer, MD  Omega-3 Fatty Acids (FISH OIL) 1000 MG CAPS Take 1,000 mg by mouth daily.   Yes Historical Provider, MD  sotalol (BETAPACE) 80 MG tablet Take 1 tablet (80 mg total) by mouth 2 (two) times daily. 09/04/15  Yes Thompson Grayer, MD  vitamin B-12 (CYANOCOBALAMIN) 1000 MCG tablet Take 1,000 mcg by mouth daily.   Yes Historical Provider, MD  Vitamin D, Ergocalciferol, (DRISDOL) 50000 UNITS CAPS capsule Take 50,000 Units  by mouth every 7 (seven) days. On Saturday 07/27/13  Yes Historical Provider, MD  warfarin (COUMADIN) 5 MG tablet Take 7.5 mg by mouth daily.    Yes Historical Provider, MD  traMADol (ULTRAM) 50 MG tablet Take 1 tablet (50 mg total) by mouth every 6 (six) hours as needed for moderate pain. Patient not taking: Reported on 05/25/2016 05/18/15   Erroll Luna, MD    Family History Family History  Problem Relation Age of Onset  . Hypertension Mother   . Brain cancer Mother   . Hypertension Sister   . Thyroid disease Sister   . Hypertension Sister   . Hypertension Brother   . Thyroid disease Brother   . Diabetes Maternal Grandmother   . Heart attack Maternal Grandmother   . Breast cancer Paternal Aunt     dx in her late 59s- early 69s  . Melanoma Paternal Aunt   . Breast cancer Paternal Aunt   . Multiple myeloma Paternal Aunt   . Other Daughter 66    ITP    Social History Social History  Substance Use Topics  . Smoking status: Former Smoker    Years: 4.00    Types: Cigarettes  . Smokeless tobacco: Former Systems developer    Quit date: 05/15/1974     Comment: quit 35-40 years ago  . Alcohol use No     Allergies   Codeine; Adhesive [tape]; and Latex   Review of Systems Review of Systems  Constitutional: Negative for chills and fever.  Respiratory: Positive for chest tightness and shortness of breath. Negative for cough.   Cardiovascular: Positive for palpitations. Negative for chest pain and leg swelling.  Gastrointestinal: Negative for abdominal pain, diarrhea, nausea and vomiting.  Musculoskeletal: Negative for back pain, neck pain and neck stiffness.  Skin: Positive for pallor. Negative for rash and wound.  Neurological: Positive for dizziness, weakness (generalized) and light-headedness. Negative for syncope and headaches.  Psychiatric/Behavioral: The patient is nervous/anxious.   All other systems reviewed and are negative.    Physical Exam Updated Vital Signs BP 134/63  (BP Location: Left Arm)   Pulse 63   Temp 98.2 F (36.8 C) (Oral)   Resp 16   Ht '5\' 2"'  (1.575 m)   Wt 199 lb 6.4 oz (90.4 kg)   LMP 11/21/2010 Comment: spotting-had hysteroscopy/d&c  SpO2 97%   BMI 36.47 kg/m   Physical Exam  Constitutional: She is oriented to person, place, and time. She appears well-developed and well-nourished. She appears distressed.  HENT:  Head: Normocephalic and atraumatic.  Mouth/Throat: Oropharynx is clear and moist.  Eyes: EOM are normal. Pupils are equal, round, and reactive to light.  Neck: Normal range of motion. Neck supple. No  JVD present.  Cardiovascular: Regular rhythm.   Tachycardia  Pulmonary/Chest: Breath sounds normal.  Mild tachypnea  Abdominal: Soft. Bowel sounds are normal. There is no tenderness. There is no rebound and no guarding.  Musculoskeletal: Normal range of motion. She exhibits no edema or tenderness.  No calf swelling, asymmetry or tenderness.  Neurological: She is alert and oriented to person, place, and time.  Moves all extremities without deficit. Sensation intact.  Skin: Skin is warm and dry. Capillary refill takes less than 2 seconds. No rash noted. No erythema.  Psychiatric: She has a normal mood and affect. Her behavior is normal.  Nursing note and vitals reviewed.    ED Treatments / Results  Labs (all labs ordered are listed, but only abnormal results are displayed) Labs Reviewed  COMPREHENSIVE METABOLIC PANEL - Abnormal; Notable for the following:       Result Value   Potassium 3.4 (*)    Chloride 100 (*)    Glucose, Bld 132 (*)    All other components within normal limits  T4, FREE - Abnormal; Notable for the following:    Free T4 1.34 (*)    All other components within normal limits  T4, FREE - Abnormal; Notable for the following:    Free T4 1.34 (*)    All other components within normal limits  TROPONIN I - Abnormal; Notable for the following:    Troponin I 0.41 (*)    All other components within  normal limits  TROPONIN I - Abnormal; Notable for the following:    Troponin I 0.42 (*)    All other components within normal limits  TROPONIN I - Abnormal; Notable for the following:    Troponin I 0.23 (*)    All other components within normal limits  PROTIME-INR - Abnormal; Notable for the following:    Prothrombin Time 21.0 (*)    All other components within normal limits  PROTIME-INR - Abnormal; Notable for the following:    Prothrombin Time 22.3 (*)    All other components within normal limits  CBC WITH DIFFERENTIAL/PLATELET  TROPONIN I  URINALYSIS, ROUTINE W REFLEX MICROSCOPIC (NOT AT [facility])  TSH  MAGNESIUM    EKG  EKG Interpretation  Date/Time:  Saturday May 25 2016 13:34:09 EDT Ventricular Rate:  214 PR Interval:    QRS Duration: 143 QT Interval:  264 QTC Calculation: 499 R Axis:   -57 Text Interpretation:  Extreme tachycardia with wide complex, no further rhythm analysis attempted Baseline wander in lead(s) V3 V4 Confirmed by Lita Mains  MD, Shadasia Oldfield ([id number]) on 05/25/2016 1:38:32 PM       Radiology No results found.  Procedures .Cardioversion Date/Time: 05/25/2016 1:30 PM Performed by: Julianne Rice Authorized by: Lita Mains, [place]   Consent:    Consent obtained:  Emergent situation Pre-procedure details:    Cardioversion basis:  Emergent   Rhythm:  Ventricular tachycardia   Electrode placement:  Anterior-posterior Attempt one:    Cardioversion mode:  Synchronous   Waveform:  Biphasic   Shock (Joules):  200   Shock outcome:  Conversion to normal sinus rhythm Post-procedure details:    Patient status:  Awake   Patient tolerance of procedure:  Tolerated well, no immediate complications   (including critical care time)  Medications Ordered in ED Medications  anastrozole (ARIMIDEX) tablet 1 mg (1 mg Oral Not Given 05/25/16 1730)  metoprolol tartrate (LOPRESSOR) tablet 25 mg (25 mg Oral Given 05/25/16 2212)  hydrochlorothiazide (MICROZIDE) capsule  12.5 mg (12.5 mg Oral Not  Given 05/25/16 1730)  omega-3 acid ethyl esters (LOVAZA) capsule 1 g (not administered)  vitamin B-12 (CYANOCOBALAMIN) tablet 1,000 mcg (not administered)  atorvastatin (LIPITOR) tablet 20 mg (not administered)  Vitamin D (Ergocalciferol) (DRISDOL) capsule 50,000 Units (not administered)  acetaminophen (TYLENOL) tablet 650 mg (not administered)  ondansetron (ZOFRAN) injection 4 mg (not administered)  Warfarin - Pharmacist Dosing Inpatient (not administered)  metoprolol (LOPRESSOR) injection 5 mg (5 mg Intravenous Given 05/25/16 1345)  midazolam (VERSED) 5 MG/5ML injection (4 mg Intravenous Given 05/25/16 1346)  warfarin (COUMADIN) tablet 10 mg (10 mg Oral Given 05/25/16 2048)   CRITICAL CARE Performed by: Lita Mains, Rayan Dyal Total critical care time: 40 minutes Critical care time was exclusive of separately billable procedures and treating other patients. Critical care was necessary to treat or prevent imminent or life-threatening deterioration. Critical care was time spent personally by me on the following activities: development of treatment plan with patient and/or surrogate as well as nursing, discussions with consultants, evaluation of patient's response to treatment, examination of patient, obtaining history from patient or surrogate, ordering and performing treatments and interventions, ordering and review of laboratory studies, ordering and review of radiographic studies, pulse oximetry and re-evaluation of patient's condition.  Initial Impression / Assessment and Plan / ED Course  I have reviewed the triage vital signs and the nursing notes.  Pertinent labs & imaging results that were available during my care of the patient were reviewed by me and considered in my medical decision making (see chart for details).  Clinical Course   Patient given dose of IV metoprolol in the emergency department with little response from heart rate. She became increasingly  lightheaded and near syncopal. Increasing pallor and diaphoresis. Repeat blood pressure with systolic in the 42H. Decision made to emergently cardiovert. Given 4 mg of Versed and synchronized cardioversion at 200 J with return to sinus rhythm. Patient maintained wide complex QRS consistent with right bundle-branch pattern. Dr. [place] cardiology at bedside to evaluate the patient and for admission.  Final Clinical Impressions(s) / ED Diagnoses   Final diagnoses:  [place] Prescriptions Current Discharge Medication List       Julianne Rice, MD 05/26/16 [phone]

## 2016-05-27 ENCOUNTER — Encounter (HOSPITAL_COMMUNITY)
Admission: EM | Disposition: A | Discharge status: Home / Self Care | Payer: Self-pay | Source: Home / Self Care | Attending: Internal Medicine

## 2016-05-27 ENCOUNTER — Inpatient Hospital Stay (HOSPITAL_COMMUNITY): Payer: BC Managed Care – PPO

## 2016-05-27 DIAGNOSIS — I4892 Unspecified atrial flutter: Secondary | ICD-10-CM

## 2016-05-27 DIAGNOSIS — I483 Typical atrial flutter: Secondary | ICD-10-CM

## 2016-05-27 HISTORY — PX: ELECTROPHYSIOLOGIC STUDY: SHX172A

## 2016-05-27 LAB — BASIC METABOLIC PANEL
Anion gap: 8 (ref 5–15)
BUN: 10 mg/dL (ref 6–20)
CALCIUM: 9.3 mg/dL (ref 8.9–10.3)
CHLORIDE: 103 mmol/L (ref 101–111)
CO2: 29 mmol/L (ref 22–32)
CREATININE: 0.64 mg/dL (ref 0.44–1.00)
GFR calc non Af Amer: >60 mL/min (ref >60)
GLUCOSE: 101 mg/dL — AB (ref 65–99)
Potassium: 3.1 mmol/L — ABNORMAL LOW (ref 3.5–5.1)
Sodium: 140 mmol/L (ref 135–145)

## 2016-05-27 LAB — CBC
HCT: 39.4 % (ref 36.0–46.0)
Hemoglobin: 13.2 g/dL (ref 12.0–15.0)
MCH: 30.6 pg (ref 26.0–34.0)
MCHC: 33.5 g/dL (ref 30.0–36.0)
MCV: 91.4 fL (ref 78.0–100.0)
PLATELETS: 198 10*3/uL (ref 150–400)
RBC: 4.31 MIL/uL (ref 3.87–5.11)
RDW: 12.8 % (ref 11.5–15.5)
WBC: 7.4 10*3/uL (ref 4.0–10.5)

## 2016-05-27 LAB — ECHOCARDIOGRAM COMPLETE
HEIGHTINCHES: 62 in
WEIGHTICAEL: 3190.4 [oz_av]

## 2016-05-27 LAB — PROTIME-INR
INR: 2.14
PROTHROMBIN TIME: 24.3 s — AB (ref 11.4–15.2)

## 2016-05-27 SURGERY — A-FLUTTER/A-TACH/SVT ABLATION
Anesthesia: LOCAL

## 2016-05-27 MED ORDER — MIDAZOLAM HCL 5 MG/5ML IJ SOLN
INTRAMUSCULAR | Status: DC | PRN
Start: 2016-05-27 — End: 2016-05-27
  Administered 2016-05-27: 2 mg via INTRAVENOUS
  Administered 2016-05-27 (×3): 1 mg via INTRAVENOUS
  Administered 2016-05-27 (×2): 2 mg via INTRAVENOUS

## 2016-05-27 MED ORDER — FENTANYL CITRATE (PF) 100 MCG/2ML IJ SOLN
INTRAMUSCULAR | Status: AC
  Filled 2016-05-27: qty 2

## 2016-05-27 MED ORDER — MIDAZOLAM HCL 5 MG/5ML IJ SOLN
INTRAMUSCULAR | Status: AC
  Filled 2016-05-27: qty 5

## 2016-05-27 MED ORDER — ACETAMINOPHEN 325 MG PO TABS: 650.0000 mg | ORAL_TABLET | ORAL | Status: DC | PRN

## 2016-05-27 MED ORDER — BUPIVACAINE HCL (PF) 0.25 % IJ SOLN
INTRAMUSCULAR | Status: DC | PRN
  Administered 2016-05-27: 60 mL

## 2016-05-27 MED ORDER — ONDANSETRON HCL 4 MG/2ML IJ SOLN: 4.0000 mg | Freq: Four times a day (QID) | INTRAMUSCULAR | Status: DC | PRN

## 2016-05-27 MED ORDER — WARFARIN SODIUM 7.5 MG PO TABS: 7.5000 mg | ORAL_TABLET | Freq: Once | ORAL | Status: DC

## 2016-05-27 MED ORDER — POTASSIUM CHLORIDE CRYS ER 20 MEQ PO TBCR
40.0000 meq | EXTENDED_RELEASE_TABLET | Freq: Once | ORAL | Status: AC
  Administered 2016-05-27: 40 meq via ORAL
  Filled 2016-05-27: qty 2

## 2016-05-27 MED ORDER — SODIUM CHLORIDE 0.9% FLUSH: 3.0000 mL | INTRAVENOUS | Status: DC | PRN

## 2016-05-27 MED ORDER — SODIUM CHLORIDE 0.9 % IV SOLN: 250.0000 mL | INTRAVENOUS | Status: DC | PRN

## 2016-05-27 MED ORDER — HEPARIN (PORCINE) IN NACL 2-0.9 UNIT/ML-% IJ SOLN
INTRAMUSCULAR | Status: DC | PRN
  Administered 2016-05-27: 17:00:00

## 2016-05-27 MED ORDER — SODIUM CHLORIDE 0.9% FLUSH
3.0000 mL | Freq: Two times a day (BID) | INTRAVENOUS | Status: DC
  Administered 2016-05-27: 3 mL via INTRAVENOUS

## 2016-05-27 MED ORDER — FENTANYL CITRATE (PF) 100 MCG/2ML IJ SOLN
INTRAMUSCULAR | Status: DC | PRN
  Administered 2016-05-27 (×2): 25 ug via INTRAVENOUS
  Administered 2016-05-27: 12.5 ug via INTRAVENOUS
  Administered 2016-05-27: 25 ug via INTRAVENOUS
  Administered 2016-05-27: 12.5 ug via INTRAVENOUS

## 2016-05-27 MED ORDER — SOTALOL HCL 80 MG PO TABS
80.0000 mg | ORAL_TABLET | Freq: Two times a day (BID) | ORAL | Status: DC
  Administered 2016-05-27 – 2016-05-28 (×2): 80 mg via ORAL
  Filled 2016-05-27 (×2): qty 1

## 2016-05-27 SURGICAL SUPPLY — 12 items
BAG SNAP BAND KOVER 36X36 (MISCELLANEOUS) ×1 IMPLANT
CATH BLAZERPRIME XP (ABLATOR) ×1 IMPLANT
CATH DUODECA HALO/ISMUS 7FR (CATHETERS) ×2 IMPLANT
CATH HEX JOS 2-5-2 65CM 6F REP (CATHETERS) ×1 IMPLANT
CATH JOSEPHSON QUAD-ALLRED 6FR (CATHETERS) ×2 IMPLANT
PACK EP LATEX FREE (CUSTOM PROCEDURE TRAY) ×2
PACK EP LF (CUSTOM PROCEDURE TRAY) IMPLANT
PAD DEFIB LIFELINK (PAD) ×1 IMPLANT
SHEATH PINNACLE 6F 10CM (SHEATH) ×1 IMPLANT
SHEATH PINNACLE 7F 10CM (SHEATH) ×1 IMPLANT
SHEATH PINNACLE 8F 10CM (SHEATH) ×2 IMPLANT
SHIELD RADPAD SCOOP 12X17 (MISCELLANEOUS) ×1 IMPLANT

## 2016-05-27 NOTE — Progress Notes (Signed)
Site area: RFV x 3 Site Prior to Removal:  Level 0 Pressure Applied For:62min Manual:  yes  Patient Status During Pull: stable  Post Pull Site:  Level Post Pull Instructions Given:  yes Post Pull Pulses Present: palpable Dressing Applied:  tegaderm Bedrest begins @ [id number] till 2430 Comments:

## 2016-05-27 NOTE — Progress Notes (Addendum)
Site area: Rt IJ Site Prior to Removal:  Level O Pressure Applied For: 52min Manual:   yes Patient Status During Pull: A/O  Post Pull Site:  Level O Post Pull Instructions Given:  Yes, pt understands post instructions Post Pull Pulses Present:  Dressing Applied:  2x2 and a tegaderm Bedrest begins @ 18:30:00 Comments:by Tammy Mink

## 2016-05-27 NOTE — Progress Notes (Signed)
ANTICOAGULATION CONSULT NOTE - Follow Up Consult  Pharmacy Consult for Coumadin Indication: afib/flutter  Allergies  Allergen Reactions  . Codeine Swelling    Facial/lips  . Adhesive [Tape] Itching and Rash    MUST USE PAPER TAPE-CAUSES BLISTERS AND BRUISES ALSO  . Latex Itching and Rash    Patient Measurements: Height: 5\' 2"  (157.5 cm) Weight: 199 lb 6.4 oz (90.4 kg) IBW/kg (Calculated) : 50.1  Vital Signs: Temp: 98.6 F (37 C) (10/16 0544) Temp Source: Oral (10/16 0544) BP: 144/68 (10/16 0544) Pulse Rate: 65 (10/16 0544)  Labs:  Recent Labs  05/25/16 1345 05/25/16 1818 05/25/16 2315 05/26/16 0458 05/27/16 0226  HGB 14.2  --   --   --  13.2  HCT 41.3  --   --   --  39.4  PLT 240  --   --   --  198  LABPROT  --  21.0*  --  22.3* 24.3*  INR  --  1.79  --  1.93 2.14  CREATININE 0.71  --   --   --  0.64  TROPONINI <0.03 0.41* 0.42* 0.23*  --     Estimated Creatinine Clearance: 73.3 mL/min (by C-G formula based on SCr of 0.64 mg/dL).   Assessment: 65 year old female continues on Coumadin INR today = 2.14 (after 10 mg x 2 doses)  Goal of Therapy:  INR 2-3 Monitor platelets by anticoagulation protocol: Yes   Plan:  Coumadin 7.5 mg po x 1 dose tonight Follow up AM INR  Thank you Anette Guarneri, PharmD [phone] 05/27/2016,9:52 AM

## 2016-05-27 NOTE — Progress Notes (Signed)
*  PRELIMINARY RESULTS* Echocardiogram 2D Echocardiogram has been performed.  Leavy Cella 05/27/2016, 10:53 AM

## 2016-05-27 NOTE — Progress Notes (Signed)
SUBJECTIVE: The patient is doing well today.  At this time, she denies chest pain, shortness of breath, or any new concerns.  She has a slight headache and didn't sleep well last night.  CURRENT MEDICATIONS: . anastrozole  1 mg Oral Daily  . atorvastatin  20 mg Oral QPC breakfast  . hydrochlorothiazide  12.5 mg Oral Daily  . metoprolol tartrate  25 mg Oral TID  . omega-3 acid ethyl esters  1 g Oral Daily  . vitamin B-12  1,000 mcg Oral Daily  . [START ON 06/01/2016] Vitamin D (Ergocalciferol)  50,000 Units Oral Q7 days  . Warfarin - Pharmacist Dosing Inpatient   Does not apply q1800   . sodium chloride      OBJECTIVE: Physical Exam: Vitals:   05/26/16 0500 05/26/16 1436 05/26/16 2030 05/27/16 0544  BP: 134/63 (!) 115/58 (!) 112/57 (!) 144/68  Pulse: 63 72 67 65  Resp: 16 18 18 16   Temp: 98.2 F (36.8 C) 97.8 F (36.6 C) 98.3 F (36.8 C) 98.6 F (37 C)  TempSrc: Oral Axillary Oral Oral  SpO2: 97% 97% 98% 98%  Weight:      Height:        Intake/Output Summary (Last 24 hours) at 05/27/16 0743 Last data filed at 05/26/16 1300  Gross per 24 hour  Intake              480 ml  Output                0 ml  Net              480 ml    Telemetry reveals sinus rhythm  GEN- The patient is elderly and overweight appearing, alert and oriented x 3 today.   Head- normocephalic, atraumatic Eyes-  Sclera clear, conjunctiva pink Ears- hearing intact Oropharynx- clear Neck- supple  Lungs- Clear to ausculation bilaterally, normal work of breathing Heart- Regular rate and rhythm, no murmurs, rubs or gallops  GI- soft, NT, ND, + BS Extremities- no clubbing, cyanosis, or edema Skin- no rash or lesion Psych- euthymic mood, full affect Neuro- strength and sensation are intact  LABS: Basic Metabolic Panel:  Recent Labs  05/25/16 1345 05/25/16 1818 05/27/16 0226  NA 135  --  140  K 3.4*  --  3.1*  CL 100*  --  103  CO2 22  --  29  GLUCOSE 132*  --  101*  BUN 11  --  10    CREATININE 0.71  --  0.64  CALCIUM 9.3  --  9.3  MG  --  1.9  --    Liver Function Tests:  Recent Labs  05/25/16 1345  AST 27  ALT 21  ALKPHOS 119  BILITOT 1.1  PROT 6.8  ALBUMIN 3.6   CBC:  Recent Labs  05/25/16 1345 05/27/16 0226  WBC 8.9 7.4  NEUTROABS 6.2  --   HGB 14.2 13.2  HCT 41.3 39.4  MCV 90.6 91.4  PLT 240 198    Recent Labs  05/25/16 1818 05/25/16 2315 05/26/16 0458  TROPONINI 0.41* 0.42* 0.23*  Thyroid Function Tests:  Recent Labs  05/25/16 1548  TSH 0.449    ASSESSMENT AND PLAN:  Principal Problem:   Atrial flutter (HCC) Active Problems:   Atrial fibrillation (H[facility])   Benign essential HTN   OSA (obstructive sleep apnea)   Breast cancer of upper-outer quadrant of right female breast ([facility])   SVT (supraventricular tachycardia) ([facility])  1.  SVT/ atrial flutter Plan for EPS/ablation today Clear liquids this morning then NPO  Risks, benefits reviewed with patient by Dr Rayann Heman who wishes to proceed.  2. Paroxysmal atrial fibrillation No symptoms of Afib since 2013.   Continue Warfarin for CHADS2VASC of 3 INR 2.14 today She has declined switching Warfarin to DOAC   3. Obesity Body mass index is 36.47 kg/m. Weight loss advised  4. HTN Stable No change required today  5. OSA Compliance with CPAP advised   6.  Hypokalemia Will replete   Dr Lovena Le to see later today  Chanetta Marshall, NP 05/27/2016 7:46 AM  EP Attending  Patient seen and examined. Agree with above. She is stable for EP study and catheter ablation of a narrow and wide QRS tachycardia. I have discussed the risk/benefit/goals/expectation of the procedure and she wishes to proceed.  Mikle Bosworth.D.

## 2016-05-28 ENCOUNTER — Encounter (HOSPITAL_COMMUNITY): Payer: Self-pay | Admitting: Internal Medicine

## 2016-05-28 DIAGNOSIS — I483 Typical atrial flutter: Secondary | ICD-10-CM

## 2016-05-28 LAB — PROTIME-INR
INR: 3.22
PROTHROMBIN TIME: 33.6 s — AB (ref 11.4–15.2)

## 2016-05-28 MED ORDER — WARFARIN SODIUM 5 MG PO TABS: 7.5000 mg | ORAL_TABLET | Freq: Every day | ORAL | Status: AC

## 2016-05-28 NOTE — Progress Notes (Signed)
Order received to discharge patient.  Patient expresses readiness to discharge.  Telemetry monitor was removed and CCMD notified.  PIV access removed without complication.  Discharge instructions, follow up, medications and instructions for their use were discussed with patient and patient voiced understanding.

## 2016-05-28 NOTE — Discharge Summary (Signed)
ELECTROPHYSIOLOGY PROCEDURE DISCHARGE SUMMARY    Patient ID: Courtney Dunlap,  MRN: [id number], DOB/AGE: 65-Oct-1952 65 y.o.  Admit date: 05/25/2016 Discharge date: 05/28/2016  Primary Care Physician: [name], MD Primary Cardiologist: Radford Pax Electrophysiologist: Allred  Primary Discharge Diagnosis:  Principal Problem:   Atrial flutter [facility]) Active Problems:   Atrial fibrillation ([place])   Benign essential HTN   OSA (obstructive sleep apnea)   Breast cancer of upper-outer quadrant of right female breast ([place])   SVT (supraventricular tachycardia) ([facility])   Allergies  Allergen Reactions  . Codeine Swelling    Facial/lips  . Adhesive [Tape] Itching and Rash    MUST USE PAPER TAPE-CAUSES BLISTERS AND BRUISES ALSO  . Latex Itching and Rash    Procedures This Admission:  1.  Echocardiogram on 05/27/16 demonstrated EF 55-60%, no RWMA, LA 37 2.  Electrophysiology study and radiofrequency catheter ablation on 05/27/16 by Dr Lovena Le demonstrated typical atrial flutter with 1:1 conduction at 240bpm and LBBB aberrancy that was successfully ablated. There were no early apparent complications.   Brief HPI/Hospital Course:  Courtney Dunlap is a 65 y.o. female with a past medical history significant for atrial fibrillation, atrial flutter, SVT, anxiety, hypertension, obesity, and OSA who was admitted for tachypalpitations. She was found to have a narrow complex tachycardia at 220bpm and was given adenosine which converted the tachycardia to a wide complex rhythm at the same rate. She was given amiodarone bolus which converted her to SR.  She was admitted for EPS/ablation.  She underwent EPS/ablation with details as outlined above. Typical atrial flutter was induced with LBBB pattern (identical to when after EMS gave adenosine).  She did not have any inducible ventricular arrhythmias.  She was monitored on telemetry overnight which demonstrated sinus rhythm. She did have a groin  rebleed which required pressure to be held again overnight.  Her Warfarin will be held for 2 days. She will remain on Sotalol and anticoagulation and follow up with Dr Rayann Heman in 4 weeks in the office.   This patients CHA2DS2-VASc Score and unadjusted Ischemic Stroke Rate (% per year) is equal to 3.2 % stroke rate/year from a score of 3 Above score calculated as 1 point each if present [CHF, HTN, DM, Vascular=MI/PAD/Aortic Plaque, Age if 65-74, or Female] Above score calculated as 2 points each if present [Age > 75, or Stroke/TIA/TE]   Physical Exam: Vitals:   05/27/16 2032 05/27/16 2100 05/28/16 0159 05/28/16 0502  BP: 126/84 135/61 (!) 149/70 (!) 155/63  Pulse: 71 80 (!) 58 61  Resp:      Temp:   97.6 F (36.4 C) 97.6 F (36.4 C)  TempSrc:   Oral Oral  SpO2:  97% 99% 100%  Weight:      Height:        GEN- The patient is obese appearing, alert and oriented x 3 today.   HEENT: normocephalic, atraumatic; sclera clear, conjunctiva pink; hearing intact; oropharynx clear; neck supple  Lungs- Clear to ausculation bilaterally, normal work of breathing.  No wheezes, rales, rhonchi Heart- Regular rate and rhythm  GI- soft, non-tender, non-distended, bowel sounds present, no hepatosplenomegaly Extremities- no clubbing, cyanosis, or edema  MS- no significant deformity or atrophy Skin- warm and dry, no rash or lesion Psych- euthymic mood, full affect Neuro- strength and sensation are intact    Labs:   Lab Results  Component Value Date   WBC 7.4 05/27/2016   HGB 13.2 05/27/2016   HCT 39.4 05/27/2016  MCV 91.4 05/27/2016   PLT 198 05/27/2016     Recent Labs Lab 05/25/16 1345 05/27/16 0226  NA 135 140  K 3.4* 3.1*  CL 100* 103  CO2 22 29  BUN 11 10  CREATININE 0.71 0.64  CALCIUM 9.3 9.3  PROT 6.8  --   BILITOT 1.1  --   ALKPHOS 119  --   ALT 21  --   AST 27  --   GLUCOSE 132* 101*     Discharge Medications:  Current Discharge Medication List    CONTINUE these  medications which have CHANGED   Details  warfarin (COUMADIN) 5 MG tablet Take 1.5 tablets (7.5 mg total) by mouth daily. Resume on 05/30/2016      CONTINUE these medications which have NOT CHANGED   Details  acetaminophen (TYLENOL) 500 MG tablet Take 500 mg by mouth every 6 (six) hours as needed for pain.    anastrozole (ARIMIDEX) 1 MG tablet Take 1 tablet (1 mg total) by mouth daily. Qty: 90 tablet, Refills: 4    atorvastatin (LIPITOR) 20 MG tablet Take 20 mg by mouth daily after breakfast.     hydrochlorothiazide (MICROZIDE) 12.5 MG capsule Take 1 capsule (12.5 mg total) by mouth daily. Qty: 90 capsule, Refills: 3    metoprolol tartrate (LOPRESSOR) 25 MG tablet Take 1 tablet (25 mg total) by mouth 3 (three) times daily as needed (for palpations and heart rate greater than 140 per minute). Qty: 45 tablet, Refills: 3    Omega-3 Fatty Acids (FISH OIL) 1000 MG CAPS Take 1,000 mg by mouth daily.    sotalol (BETAPACE) 80 MG tablet Take 1 tablet (80 mg total) by mouth 2 (two) times daily. Qty: 180 tablet, Refills: 3    vitamin B-12 (CYANOCOBALAMIN) 1000 MCG tablet Take 1,000 mcg by mouth daily.    Vitamin D, Ergocalciferol, (DRISDOL) 50000 UNITS CAPS capsule Take 50,000 Units by mouth every 7 (seven) days. On Saturday    traMADol (ULTRAM) 50 MG tablet Take 1 tablet (50 mg total) by mouth every 6 (six) hours as needed for moderate pain. Qty: 30 tablet, Refills: 0        Disposition: Pt is being discharged home today in good condition. Discharge Instructions    Diet - low sodium heart healthy    Complete by:  As directed    Increase activity slowly    Complete by:  As directed      Follow-up Information    Thompson Grayer, MD Follow up on 07/01/2016.   Specialty:  Cardiology Why:  at [facility] information: [place] [address] [phone]           Duration of Discharge Encounter: Greater than 30 minutes including physician  time.  Signed, Chanetta Marshall, NP 05/28/2016 8:33 AM  EP Attending  Patient seen and examined. Agree with above. She is stable for DC home. Usual followup.  Mikle Bosworth.D.

## 2016-05-28 NOTE — Progress Notes (Signed)
Attempted getting out of bed on her own when the bed rest was over. Bleeding occurred from the Right groin incision. Pressure was applied, bleeding stopped. Vital signs were taken and MD notified. No new orders obtained at this time.  Patient told to call whenever she needs to get out of bed.Will continue to monitor.

## 2016-05-28 NOTE — Discharge Instructions (Signed)
No driving for 4 days. No lifting over 5 lbs for 1 week. No sexual activity for 1 week. You may return to work on 06/04/2016. Keep procedure site clean & dry. If you notice increased pain, swelling, bleeding or pus, call/return!  You may shower, but no soaking baths/hot tubs/pools for 1 week.

## 2016-05-30 ENCOUNTER — Telehealth: Payer: Self-pay | Admitting: Internal Medicine

## 2016-05-30 NOTE — Telephone Encounter (Signed)
Follow up      Pt is taking off her bandages from the ablation.  The last bandage is kinda of stuck to her skin.  Pt is afraid to pull it---any suggestions on how to get the bandage off?

## 2016-05-30 NOTE — Telephone Encounter (Signed)
Returned call to patient and advised to take the dressing off.  It has been greater than 24 hours since discharge and should be okay.  Wash area with warm soapy water.

## 2016-05-30 NOTE — Telephone Encounter (Signed)
Follow Up:    Pt had an ablation on Monday,she have a question about removing the bandage.

## 2016-05-31 NOTE — Telephone Encounter (Signed)
Spoke with patient and she was able to get the bandage off by putting warm water on the area and this loosened the dried blood making it easier to come off.  She has not had anymore bleeding.  She does have a large bruise which I told her was normal.  She will keep a watch on it and let me know if she has any further questions.

## 2016-06-12 HISTORY — PX: ABLATION: SHX5711

## 2016-07-01 ENCOUNTER — Ambulatory Visit (INDEPENDENT_AMBULATORY_CARE_PROVIDER_SITE_OTHER): Payer: BC Managed Care – PPO | Admitting: Internal Medicine

## 2016-07-01 ENCOUNTER — Encounter: Payer: Self-pay | Admitting: Internal Medicine

## 2016-07-01 VITALS — BP 110/72 | HR 77 | Ht 62.5 in | Wt 191.0 lb

## 2016-07-01 DIAGNOSIS — I471 Supraventricular tachycardia: Secondary | ICD-10-CM

## 2016-07-01 DIAGNOSIS — I480 Paroxysmal atrial fibrillation: Secondary | ICD-10-CM

## 2016-07-01 DIAGNOSIS — G4733 Obstructive sleep apnea (adult) (pediatric): Secondary | ICD-10-CM

## 2016-07-01 NOTE — Progress Notes (Signed)
Electrophysiology Office Note   Date:  07/01/2016   ID:  Courtney Dunlap, DOB [birth year], MRN [id number]  PCP:  Courtney Stack, MD  Cardiologist:  Dr Radford Pax Primary Electrophysiologist: Thompson Grayer, MD    Chief Complaint  Patient presents with  . Atrial Fibrillation     History of Present Illness: Courtney Dunlap is a 65 y.o. female who presents today for electrophysiology follow-up.   She recently presented to [facility] with rapidly conducting atrial flutter.  She underwent EPS and ablation of typical atrial flutter by Dr Lovena Le.  Though the procedure was quite routine, it took her "several weeks" to return to her baseline.  She reports groin discomfort as well as significant fatigue and generalized weakness.  These symptoms now seem better.  She is unaware of any additional arrhythmias. Today, she denies symptoms of palpitations, chest pain, shortness of breath, orthopnea, PND, lower extremity edema, claudication, dizziness, presyncope, syncope, bleeding, or neurologic sequela. The patient is tolerating medications without difficulties and is otherwise without complaint today.    Past Medical History:  Diagnosis Date  . A-fib ([place])   . Allergic dermatitis   . Anxiety   . Arthritis    back, knees & ankles   . Breast cancer ([place])   . Cancer ([place])    skin- lower abdomen  . Depression   . GERD (gastroesophageal reflux disease)   . Hiatal hernia    with GERD  . Hypercholesteremia   . Hypertension   . Hyperthyroidism    pt. reports that she thinks she has hypothyroidism, states MD- [place] took her off med. in the past 1-2 yrs.   . Mild cognitive impairment with memory loss   . Morbid obesity ([place])   . Sleep apnea    severe with AHI 37/hr now on CPAP at 13cm H2O  . SVT (supraventricular tachycardia) ([facility])    Past Surgical History:  Procedure Laterality Date  . APPENDECTOMY  28 years ago  . DILATION AND CURETTAGE OF UTERUS  1996    Polyp resection   .  ELECTROPHYSIOLOGIC STUDY N/A 05/27/2016   Procedure: EPS/SVT Ablation;  Surgeon: Evans Lance, MD;  Location: [place];  Service: Cardiovascular;  Laterality: N/A;  . HYSTEROSCOPY  4/12   D&C  . RADIOACTIVE SEED GUIDED MASTECTOMY WITH AXILLARY SENTINEL LYMPH NODE BIOPSY Right 05/18/2015   Procedure: RIGHT BREAST RADIOACTIVE SEED PARTIAL MASTECTOMY WITH RIGHT SENTINEL LYMPH NODE MAPPING;  Surgeon: Erroll Luna, MD;  Location: [facility];  Service: General;  Laterality: Right;  . TONSILLECTOMY    . TUBAL LIGATION       Current Outpatient Prescriptions  Medication Sig Dispense Refill  . acetaminophen (TYLENOL) 500 MG tablet Take 500 mg by mouth every 6 (six) hours as needed for pain.    [name] anastrozole (ARIMIDEX) 1 MG tablet Take 1 tablet (1 mg total) by mouth daily. 90 tablet 4  . atorvastatin (LIPITOR) 20 MG tablet Take 20 mg by mouth daily after breakfast.     . hydrochlorothiazide (MICROZIDE) 12.5 MG capsule Take 1 capsule (12.5 mg total) by mouth daily. 90 capsule 3  . metoprolol tartrate (LOPRESSOR) 25 MG tablet Take 1 tablet (25 mg total) by mouth 3 (three) times daily as needed (for palpations and heart rate greater than 140 per minute). 45 tablet 3  . Omega-3 Fatty Acids (FISH OIL) 1000 MG CAPS Take 1,000 mg by mouth daily.    . sotalol (BETAPACE) 80 MG tablet Take 1 tablet (80  mg total) by mouth 2 (two) times daily. 180 tablet 3  . traMADol (ULTRAM) 50 MG tablet Take 1 tablet (50 mg total) by mouth every 6 (six) hours as needed for moderate pain. 30 tablet 0  . vitamin B-12 (CYANOCOBALAMIN) 1000 MCG tablet Take 1,000 mcg by mouth daily.    . Vitamin D, Ergocalciferol, (DRISDOL) 50000 UNITS CAPS capsule Take 50,000 Units by mouth every 7 (seven) days. On Saturday    . warfarin (COUMADIN) 5 MG tablet Take 1.5 tablets (7.5 mg total) by mouth daily. Resume on 05/30/2016     No current facility-administered medications for this visit.     Allergies:   Codeine; Adhesive [tape]; and  Latex   Social History:  The patient  reports that she has quit smoking. Her smoking use included Cigarettes. She quit after 4.00 years of use. She quit smokeless tobacco use about 42 years ago. She reports that she does not drink alcohol or use drugs.   Family History:  The patient's  family history includes Brain cancer in her mother; Breast cancer in her paternal aunt and paternal aunt; Diabetes in her maternal grandmother; Heart attack in her maternal grandmother; Hypertension in her brother, mother, sister, and sister; Melanoma in her paternal aunt; Multiple myeloma in her paternal aunt; Other (age of onset: 63) in her daughter; Thyroid disease in her brother and sister.    ROS:  Please see the history of present illness.   All other systems are reviewed and negative.    PHYSICAL EXAM: VS:  BP 110/72   Pulse 77   Ht 5' 2.5" (1.588 m)   Wt 191 lb (86.6 kg)   LMP 11/21/2010 Comment: spotting-had hysteroscopy/d&c  BMI 34.38 kg/m  , BMI Body mass index is 34.38 kg/m. GEN: Well nourished, well developed, in no acute distress  HEENT: normal  Neck: no JVD, carotid bruits, or masses Cardiac: RRR; no murmurs, rubs, or gallops,no edema  Respiratory:  clear to auscultation bilaterally, normal work of breathing GI: soft, nontender, nondistended, + BS MS: no deformity or atrophy  Skin: warm and dry  Neuro:  Strength and sensation are intact Psych: euthymic mood, full affect  EKG:  EKG is ordered today. The ekg ordered today shows sinus rhythm 77 bpm, PR 140 msec, QRS 88 msec, Qtc 411 msec.   Recent Labs: 05/25/2016: ALT 21; Magnesium 1.9; TSH 0.449 05/27/2016: BUN 10; Creatinine, Ser 0.64; Hemoglobin 13.2; Platelets 198; Potassium 3.1; Sodium 140    Lipid Panel  No results found for: CHOL, TRIG, HDL, CHOLHDL, VLDL, LDLCALC, LDLDIRECT   Wt Readings from Last 3 Encounters:  07/01/16 191 lb (86.6 kg)  05/25/16 199 lb 6.4 oz (90.4 kg)  05/13/16 204 lb 14.4 oz (92.9 kg)       ASSESSMENT AND PLAN:  1.  SVT/atrial flutter Doing well s/p CTI ablation Resolved No complications with the procedure  2. Paroxysmal atrial fibrillation She has previously had afib though this was remotely Continue sotalol and coumadin for now Could consider stopping sotalol down the road if appropriate lifestyle modification can be performed  3. Obesity Weight loss encouraged Body mass index is 34.38 kg/m.  4. OSA Compliance with CPAP encouraged Followed by Dr Radford Pax  5.  HTN Stable No change required today  Current medicines are reviewed at length with the patient today.   The patient does not have concerns regarding her medicines.  The following changes were made today:  none   Follow-up with Dr Radford Pax as scheduled Follow-up  with EP NP in 3 months  Signed, Thompson Grayer, MD  07/01/2016 10:41 PM     [address] [place] [address] [phone] (office) [phone] (fax)

## 2016-07-01 NOTE — Patient Instructions (Signed)
Medication Instructions:  Your physician recommends that you continue on your current medications as directed. Please refer to the Current Medication list given to you today.   Labwork: None ordered   Testing/Procedures: None ordered   Follow-Up: Your physician recommends that you schedule a follow-up appointment in: 3 months with  Chanetta Marshall, NP and 6 months with Dr Rayann Heman   Any Other Special Instructions Will Be Listed Below (If Applicable).     If you need a refill on your cardiac medications before your next appointment, please call your pharmacy.

## 2016-07-17 ENCOUNTER — Telehealth: Payer: Self-pay | Admitting: *Deleted

## 2016-07-17 NOTE — Telephone Encounter (Signed)
Patient called to question if Dr Radford Pax wanted her to continue taking the hctz. Patient can be reached at [phone]. Please advise. Thanks, MI

## 2016-07-18 NOTE — Telephone Encounter (Signed)
Left message to call back  

## 2016-07-25 MED ORDER — HYDROCHLOROTHIAZIDE 12.5 MG PO CAPS: 12.5000 mg | ORAL_CAPSULE | Freq: Every day | ORAL | 3 refills | Status: DC

## 2016-07-25 NOTE — Telephone Encounter (Signed)
Patient states she thought she was supposed to stop HCTZ because the pharmacy has no active refills. Informed her she is only to stop a medication if an MD instructs her to stop. Refills called in to [facility]. She was grateful for assistance.

## 2016-07-30 ENCOUNTER — Encounter: Payer: Self-pay | Admitting: Radiation Oncology

## 2016-09-03 ENCOUNTER — Other Ambulatory Visit: Payer: Self-pay | Admitting: Internal Medicine

## 2016-09-03 NOTE — Telephone Encounter (Signed)
Rx refill sent to pharmacy. 

## 2016-10-02 ENCOUNTER — Ambulatory Visit: Payer: BC Managed Care – PPO | Admitting: Nurse Practitioner

## 2016-10-09 DIAGNOSIS — Z0289 Encounter for other administrative examinations: Secondary | ICD-10-CM

## 2016-10-15 ENCOUNTER — Encounter: Payer: Self-pay | Admitting: Cardiology

## 2016-10-22 ENCOUNTER — Ambulatory Visit: Payer: BC Managed Care – PPO | Admitting: Cardiology

## 2016-11-04 ENCOUNTER — Other Ambulatory Visit: Payer: BC Managed Care – PPO

## 2016-11-06 ENCOUNTER — Other Ambulatory Visit (HOSPITAL_BASED_OUTPATIENT_CLINIC_OR_DEPARTMENT_OTHER): Payer: BC Managed Care – PPO

## 2016-11-06 DIAGNOSIS — C50411 Malignant neoplasm of upper-outer quadrant of right female breast: Secondary | ICD-10-CM | POA: Diagnosis not present

## 2016-11-06 DIAGNOSIS — Z170 Estrogen receptor positive status [ER+]: Principal | ICD-10-CM

## 2016-11-06 LAB — COMPREHENSIVE METABOLIC PANEL
ALBUMIN: 3.7 g/dL (ref 3.5–5.0)
ALT: 26 U/L (ref 0–55)
ANION GAP: 9 meq/L (ref 3–11)
AST: 25 U/L (ref 5–34)
Alkaline Phosphatase: 162 U/L — ABNORMAL HIGH (ref 40–150)
BILIRUBIN TOTAL: 0.39 mg/dL (ref 0.20–1.20)
BUN: 18.5 mg/dL (ref 7.0–26.0)
CALCIUM: 9.9 mg/dL (ref 8.4–10.4)
CO2: 28 mEq/L (ref 22–29)
Chloride: 101 mEq/L (ref 98–109)
Creatinine: 0.8 mg/dL (ref 0.6–1.1)
EGFR: 78 mL/min/{1.73_m2} — AB (ref >90)
Glucose: 109 mg/dl (ref 70–140)
Potassium: 4.8 mEq/L (ref 3.5–5.1)
Sodium: 138 mEq/L (ref 136–145)
TOTAL PROTEIN: 7.8 g/dL (ref 6.4–8.3)

## 2016-11-06 LAB — CBC WITH DIFFERENTIAL/PLATELET
BASO%: 0.5 % (ref 0.0–2.0)
Basophils Absolute: 0.1 10*3/uL (ref 0.0–0.1)
EOS ABS: 0.3 10*3/uL (ref 0.0–0.5)
EOS%: 2.8 % (ref 0.0–7.0)
HCT: 41.2 % (ref 34.8–46.6)
HGB: 13.6 g/dL (ref 11.6–15.9)
LYMPH%: 14.9 % (ref 14.0–49.7)
MCH: 30.4 pg (ref 25.1–34.0)
MCHC: 33 g/dL (ref 31.5–36.0)
MCV: 92 fL (ref 79.5–101.0)
MONO#: 1.1 10*3/uL — ABNORMAL HIGH (ref 0.1–0.9)
MONO%: 12 % (ref 0.0–14.0)
NEUT%: 69.8 % (ref 38.4–76.8)
NEUTROS ABS: 6.6 10*3/uL — AB (ref 1.5–6.5)
PLATELETS: 228 10*3/uL (ref 145–400)
RBC: 4.48 10*6/uL (ref 3.70–5.45)
RDW: 14 % (ref 11.2–14.5)
WBC: 9.5 10*3/uL (ref 3.9–10.3)
lymph#: 1.4 10*3/uL (ref 0.9–3.3)

## 2016-11-11 ENCOUNTER — Telehealth: Payer: Self-pay | Admitting: Oncology

## 2016-11-11 ENCOUNTER — Encounter: Payer: Self-pay | Admitting: Oncology

## 2016-11-11 ENCOUNTER — Ambulatory Visit (HOSPITAL_BASED_OUTPATIENT_CLINIC_OR_DEPARTMENT_OTHER): Payer: BC Managed Care – PPO | Admitting: Oncology

## 2016-11-11 VITALS — BP 130/56 | HR 68 | Temp 98.6°F | Resp 18 | Ht 62.5 in | Wt 204.8 lb

## 2016-11-11 DIAGNOSIS — C50411 Malignant neoplasm of upper-outer quadrant of right female breast: Secondary | ICD-10-CM

## 2016-11-11 DIAGNOSIS — Z170 Estrogen receptor positive status [ER+]: Secondary | ICD-10-CM | POA: Diagnosis not present

## 2016-11-11 DIAGNOSIS — Z79811 Long term (current) use of aromatase inhibitors: Secondary | ICD-10-CM

## 2016-11-11 NOTE — Telephone Encounter (Signed)
Gave patient avs report and appointments for October  °

## 2016-11-11 NOTE — Progress Notes (Signed)
Courtney Dunlap  [phone] [phone]     ID: Courtney Dunlap DOB: [birth year]  MR#: [id number]  QFD#:[id number]  Patient Care Team: Courtney Bowen, MD as PCP - General (Endocrinology) Courtney Low, MD as Consulting Physician (Internal Medicine) Courtney Margarita, MD as Consulting Physician (Cardiology) Courtney Luna, MD as Consulting Physician (General Surgery) Courtney Cruel, MD as Consulting Physician (Oncology) Courtney Gibson, MD as Attending Physician (Radiation Oncology) Courtney Germany, RN as Registered Nurse Courtney Kaufmann, RN as Registered Nurse Courtney Salon, MD as Consulting Physician (Gynecology) Courtney Cheese, NP as Nurse Practitioner (Nurse Practitioner) PCP: Courtney Stack, MD OTHER MD: Courtney Dunlap M.D.  CHIEF COMPLAINT: Estrogen receptor positive breast cancer  CURRENT TREATMENT: anastrozole   BREAST CANCER HISTORY: From the original intake note:  Courtney Dunlap had routine screening mammography at [facility] 04/11/2015 showing a new irregular lesion in the right breast. Diagnostic right unilateral mammogram and ultrasound 04/13/2015 found the breast density to be category B. In the right breast at the 9:00 position there was a mass with indistinct margins associated with architectural distortion. By ultrasound this was a 1 cm. Ultrasound of the axilla showed no abnormality.  Biopsy of the right breast mass in question 04/18/2015 showed ([id number]) and invasive ductal carcinoma, grade 2, estrogen receptor and progesterone receptor both 100% positive, with strong staining intensity, with an MIB-1 of 3%, and no HER-2 amplification, the signals ratio being 1.59 and the number per cell 2.30.  The patient's subsequent history is as detailed below.   INTERVAL HISTORY: Courtney Dunlap returns today for follow-up of her estrogen receptor positive breast cancer. She continues on anastrozole, with good tolerance.  Hot flashes and vaginal dryness  are not a major issue. She never developed the arthralgias or myalgias that many patients can experience on this medication. She obtains it at a good price.  Since the last visit here she had an episode of atrial flutter and she underwent an ablation procedure October 2017 under Courtney Dunlap. She tells me she has had no further problems though of course she continues to have atrial fibrillation and continues on Coumadin, followed through [facility] Dunlap clinic    REVIEW OF SYSTEMS: A detailed review of systems today was otherwise stable  PAST MEDICAL HISTORY: Past Medical History:  Diagnosis Date  . A-fib ([place])   . Allergic dermatitis   . Anxiety   . Arthritis    back, knees & ankles   . Breast cancer ([place])   . Cancer ([place])    skin- lower abdomen  . Depression   . GERD (gastroesophageal reflux disease)   . Hiatal hernia    with GERD  . Hypercholesteremia   . Hypertension   . Hyperthyroidism    pt. reports that she thinks she has hypothyroidism, states MD- Courtney Dunlap took her off med. in the past 1-2 yrs.   . Mild cognitive impairment with memory loss   . Morbid obesity ([place])   . Sleep apnea    severe with AHI 37/hr now on CPAP at 13cm H2O  . SVT (supraventricular tachycardia) ([place])    likely due to atrial flutter.  successfullly ablated by Courtney Dunlap 10/17    PAST SURGICAL HISTORY: Past Surgical History:  Procedure Laterality Date  . APPENDECTOMY  28 years ago  . DILATION AND CURETTAGE OF UTERUS  1996    Polyp resection   . ELECTROPHYSIOLOGIC STUDY N/A 05/27/2016   Procedure: EPS/SVT Ablation;  Surgeon: Courtney Lance, MD;  Location:  [place] INVASIVE CV LAB;  Service: Cardiovascular;  Laterality: N/A;  . HYSTEROSCOPY  4/12   D&C  . RADIOACTIVE SEED GUIDED MASTECTOMY WITH AXILLARY SENTINEL LYMPH NODE BIOPSY Right 05/18/2015   Procedure: RIGHT BREAST RADIOACTIVE SEED PARTIAL MASTECTOMY WITH RIGHT SENTINEL LYMPH NODE MAPPING;  Surgeon: Courtney Luna, MD;  Location: [facility];  Service:  General;  Laterality: Right;  . TONSILLECTOMY    . TUBAL LIGATION      FAMILY HISTORY Family History  Problem Relation Age of Onset  . Hypertension Mother   . Brain cancer Mother   . Hypertension Sister   . Thyroid disease Sister   . Hypertension Sister   . Hypertension Brother   . Thyroid disease Brother   . Diabetes Maternal Grandmother   . Heart attack Maternal Grandmother   . Breast cancer Paternal Aunt     dx in her late 64s- early 66s  . Melanoma Paternal Aunt   . Breast cancer Paternal Aunt   . Multiple myeloma Paternal Aunt   . Other Daughter 46    ITP   the patient's father was diagnosed with squamous cell cancer of the throat at age 35 and died 49 years later. The patient's mother died from end-stage renal disease at age 78. The patient had one brother, 2 sisters. There is no cancer in the immediate family except as noted, but one of the patient's father's 5 sisters was diagnosed with breast cancer before the age of 45. Another 1 had multiple myeloma. On the patient's mother sides there is a history of brain tumor in a niece.   GYNECOLOGIC HISTORY:  Patient's last menstrual period was 11/21/2010. Menarche age 74, first live birth age 22. The patient is GX P2. She went through menopause in 2002 or thereabouts. She did not take hormone replacement. 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 Her husband Courtney Dunlap is a retired Clinical biochemist. Daughter Courtney Dunlap lives in [place] and is a housewife. Daughter "Courtney Dunlap" and Dunlap this in [place]. 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 The patient has 2 grandchildren and 3 great-grandchildren. She is a Psychologist, forensic.    ADVANCED DIRECTIVES: Not in place   HEALTH MAINTENANCE: Social History  Substance Use Topics  . Smoking status:  Former Smoker    Years: 4.00    Types: Cigarettes  . Smokeless tobacco: Former Systems developer    Quit date: 05/15/1974     Comment: quit 35-40 years ago  . Alcohol use No     Colonoscopy: ?1997  PAP: FEB 2016  Bone density: October 2016 [facility], normal  Lipid panel:  Allergies  Allergen Reactions  . Codeine Swelling    Facial/lips  . Adhesive [Tape] Itching and Rash    MUST USE PAPER TAPE-CAUSES BLISTERS AND BRUISES ALSO  . Latex Itching and Rash    Current Outpatient Prescriptions  Medication Sig Dispense Refill  . acetaminophen (TYLENOL) 500 MG tablet Take 500 mg by mouth every 6 (six) hours as needed for pain.    [name] anastrozole (ARIMIDEX) 1 MG tablet Take 1 tablet (1 mg total) by mouth daily. 90 tablet 4  . atorvastatin (LIPITOR) 20 MG tablet Take 20 mg by mouth daily after breakfast.     . hydrochlorothiazide (MICROZIDE) 12.5 MG capsule Take 1 capsule (12.5 mg total) by mouth daily. 90 capsule 3  .  metoprolol tartrate (LOPRESSOR) 25 MG tablet Take 1 tablet (25 mg total) by mouth 3 (three) times daily as needed (for palpations and heart rate greater than 140 per minute). 45 tablet 3  . Omega-3 Fatty Acids (FISH OIL) 1000 MG CAPS Take 1,000 mg by mouth daily.    . sotalol (BETAPACE) 80 MG tablet TAKE 1 TABLET TWICE A DAY 180 tablet 1  . traMADol (ULTRAM) 50 MG tablet Take 1 tablet (50 mg total) by mouth every 6 (six) hours as needed for moderate pain. 30 tablet 0  . vitamin B-12 (CYANOCOBALAMIN) 1000 MCG tablet Take 1,000 mcg by mouth daily.    . Vitamin D, Ergocalciferol, (DRISDOL) 50000 UNITS CAPS capsule Take 50,000 Units by mouth every 7 (seven) days. On Saturday    . warfarin (COUMADIN) 5 MG tablet Take 1.5 tablets (7.5 mg total) by mouth daily. Resume on 05/30/2016     No current facility-administered medications for this visit.     OBJECTIVE: Middle-aged white woman In no acute distress  Vitals:   11/11/16 1516  BP: (!) 130/56  Pulse: 68  Resp: 18  Temp: 98.6  F (37 C)     Body mass index is 36.86 kg/m.    ECOG FS:1 - Symptomatic but completely ambulatory  Sclerae unicteric, EOMs intact Oropharynx clear and moist No cervical or supraclavicular adenopathy Lungs no rales or rhonchi Heart regular rate and rhythm Abd soft, nontender, positive bowel sounds MSK no focal spinal tenderness, no upper extremity lymphedema Neuro: nonfocal, well oriented, appropriate affect Breasts: The right breast has undergone lumpectomy followed by radiation with no evidence of local recurrence. There is a known seroma superiorly and slightly medial, with no tenderness or erythema. The right axilla is benign. The left breast is unremarkable.  LAB RESULTS:  CMP     Component Value Date/Time   NA 138 11/06/2016 0752   K 4.8 11/06/2016 0752   CL 103 05/27/2016 0226   CO2 28 11/06/2016 0752   GLUCOSE 109 11/06/2016 0752   BUN 18.5 11/06/2016 0752   CREATININE 0.8 11/06/2016 0752   CALCIUM 9.9 11/06/2016 0752   PROT 7.8 11/06/2016 0752   ALBUMIN 3.7 11/06/2016 0752   AST 25 11/06/2016 0752   ALT 26 11/06/2016 0752   ALKPHOS 162 (H) 11/06/2016 0752   BILITOT 0.39 11/06/2016 0752   GFRNONAA >60 05/27/2016 0226   GFRAA >60 05/27/2016 0226    INo results found for: SPEP, UPEP  Lab Results  Component Value Date   WBC 9.5 11/06/2016   NEUTROABS 6.6 (H) 11/06/2016   HGB 13.6 11/06/2016   HCT 41.2 11/06/2016   MCV 92.0 11/06/2016   PLT 228 11/06/2016      Chemistry      Component Value Date/Time   NA 138 11/06/2016 0752   K 4.8 11/06/2016 0752   CL 103 05/27/2016 0226   CO2 28 11/06/2016 0752   BUN 18.5 11/06/2016 0752   CREATININE 0.8 11/06/2016 0752      Component Value Date/Time   CALCIUM 9.9 11/06/2016 0752   ALKPHOS 162 (H) 11/06/2016 0752   AST 25 11/06/2016 0752   ALT 26 11/06/2016 0752   BILITOT 0.39 11/06/2016 0752       No results found for: LABCA2  No components found for: LABCA125  No results for input(s): INR in the last  168 hours.  Urinalysis    Component Value Date/Time   COLORURINE YELLOW 05/25/2016 [place] 05/25/2016 1645   LABSPEC  1.008 05/25/2016 1645   PHURINE 7.5 05/25/2016 [place] 05/25/2016 [place] NEGATIVE 05/25/2016 [place] 05/25/2016 [place] 12/11/2015 1321   [place] 05/25/2016 1645   PROTEINUR NEGATIVE 05/25/2016 1645   UROBILINOGEN negative 12/11/2015 1321   NITRITE NEGATIVE 05/25/2016 1645   LEUKOCYTESUR NEGATIVE 05/25/2016 1645    STUDIES: Mammography [facility] the right breast at [facility] 04/18/2016 showed a 5.3 cm oval fluid collection consistent with seroma. There was no evidence of malignancy.  ASSESSMENT: 66 y.o. [place] woman, status post right breast upper outer quadrant biopsy 04/18/2015 for a clinical T1b N0, stage IA invasive ductal carcinoma, grade 2, strongly estrogen and progesterone receptor positive, HER-2 not amplified, with an MIB-1 of 3%  (1) status post right lumpectomy and sentinel lymph node sampling 05/18/2015 for a  pT1c pN0, stage IA invasive ductal carcinoma, grade 1, repeat HER-2 again negative  (2) Oncotype DX score of 11 predicts an outside the breast recurrence within 10 years of 7% if the patient's only systemic therapy is tamoxifen for 5 years. It also predicts no benefit from chemotherapy  (3) adjuvant radiation completed December 2017 thank you  (4) genetics testing 05/11/2015 through the Breast/Ovarian gene panel offered by [facility] found no deleterious mutations in ATM, BARD1, BRCA1, BRCA2, BRIP1, CDH1, CHEK2, EPCAM, FANCC, MLH1, MSH2, MSH6, NBN, PALB2, PMS2, PTEN, RAD51C, RAD51D, TP53, and XRCC2.  (5) started anastrozole 08/12/2014  (a) DEXA scan at [facility] 05/30/2015 showed a T score of 1.0 (normal)  (b) anastrozole hed Jul;y 2017 due to arthralgias/ myalgias  (c) anastrozole resumed October 2017 as there was no change in symptoms off the  drug  PLAN: Alysia Is now a year and a half out from definitive surgery for her breast cancer with no evidence of disease recurrence. This is very favorable.  She continues on anastrozole with good tolerance. The plan will be to continue that for a total of 5 years. Her graft she had a normal bone density scan October 2016. Hopefully there will not be much change when that is repeated later this year.  We discussed the fact that the blip in her alkaline phosphatase is very minimal, not associated with any symptoms and with all the other liver function tests normal. This just needs to be repeated and since she will be seeing Courtney. Forde Dandy sometime in May that would be the easiest way to get that accomplished  Otherwise she will return to see me in October after her repeat mammography. At that point I will probably start seeing her on a once a year basis  She knows to call for any other problems that may develop before her next visit here.  :Courtney Cruel, MD   11/11/2016 3:40 PM Medical Oncology and Hematology [facility] [place], [place] [address] Tel. [phone]    Fax. [phone]

## 2016-12-04 ENCOUNTER — Ambulatory Visit (INDEPENDENT_AMBULATORY_CARE_PROVIDER_SITE_OTHER): Payer: BC Managed Care – PPO | Admitting: Cardiology

## 2016-12-04 ENCOUNTER — Encounter (INDEPENDENT_AMBULATORY_CARE_PROVIDER_SITE_OTHER): Payer: Self-pay

## 2016-12-04 ENCOUNTER — Encounter: Payer: Self-pay | Admitting: Cardiology

## 2016-12-04 VITALS — BP 128/62 | HR 64 | Ht 62.5 in | Wt 200.0 lb

## 2016-12-04 DIAGNOSIS — G4733 Obstructive sleep apnea (adult) (pediatric): Secondary | ICD-10-CM

## 2016-12-04 DIAGNOSIS — I10 Essential (primary) hypertension: Secondary | ICD-10-CM | POA: Diagnosis not present

## 2016-12-04 DIAGNOSIS — I480 Paroxysmal atrial fibrillation: Secondary | ICD-10-CM

## 2016-12-04 NOTE — Progress Notes (Signed)
Cardiology Office Note    Date:  12/04/2016   ID:  Courtney Dunlap, [name] [birth year], MRN [id number]  PCP:  [name], MD  Cardiologist:  Fransico Him, MD   Chief Complaint  Patient presents with  . Atrial Fibrillation  . Hypertension  . Sleep Apnea    History of Present Illness:  Courtney Dunlap is a 66 y.o. female with a history of PAF in the setting of hyperthyroidism, SVT s/p ablation, HTN and OSA on CPAP who is doing well.She denies any chest pain, SOB, DOE (except with going up stairs or exercising), dizziness or syncope. 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 She occasionally has some skipped beats but never last more than 15 minutes.  She tolerates the CPAP and uses a nasal pillow mask which she tolerates well.She does not use a chin strap.  Sometimes she thinks the pressure is too much. She feels rested in the am but does nap during the day some on the weekends.  She has some problems with dry mouth and drinks water at night.     Past Medical History:  Diagnosis Date  . A-fib ([place])   . Allergic dermatitis   . Anxiety   . Arthritis    back, knees & ankles   . Breast cancer ([place])   . Cancer ([place])    skin- lower abdomen  . Depression   . GERD (gastroesophageal reflux disease)   . Hiatal hernia    with GERD  . Hypercholesteremia   . Hypertension   . Hyperthyroidism    pt. reports that she thinks she has hypothyroidism, states MD- [place] took her off med. in the past 1-2 yrs.   . Mild cognitive impairment with memory loss   . Morbid obesity ([place])   . Sleep apnea    severe with AHI 37/hr now on CPAP at 13cm H2O  . SVT (supraventricular tachycardia) ([place])    likely due to atrial flutter.  successfullly ablated by Dr Lovena Le 10/17    Past Surgical History:  Procedure Laterality Date  . APPENDECTOMY  28 years ago  . DILATION AND CURETTAGE OF UTERUS  1996    Polyp resection   . ELECTROPHYSIOLOGIC STUDY N/A 05/27/2016   Procedure:  EPS/SVT Ablation;  Surgeon: Evans Lance, MD;  Location: [facility] CV LAB;  Service: Cardiovascular;  Laterality: N/A;  . HYSTEROSCOPY  4/12   D&C  . RADIOACTIVE SEED GUIDED MASTECTOMY WITH AXILLARY SENTINEL LYMPH NODE BIOPSY Right 05/18/2015   Procedure: RIGHT BREAST RADIOACTIVE SEED PARTIAL MASTECTOMY WITH RIGHT SENTINEL LYMPH NODE MAPPING;  Surgeon: Erroll Luna, MD;  Location: [facility];  Service: General;  Laterality: Right;  . TONSILLECTOMY    . TUBAL LIGATION      Current Medications: Current Meds  Medication Sig  . acetaminophen (TYLENOL) 500 MG tablet Take 500 mg by mouth every 6 (six) hours as needed for pain.  [name] anastrozole (ARIMIDEX) 1 MG tablet Take 1 tablet (1 mg total) by mouth daily.  [name] atorvastatin (LIPITOR) 20 MG tablet Take 20 mg by mouth daily after breakfast.   . hydrochlorothiazide (MICROZIDE) 12.5 MG capsule Take 1 capsule (12.5 mg total) by mouth daily.  . metoprolol tartrate (LOPRESSOR) 25 MG tablet Take 1 tablet (25 mg total) by mouth 3 (three) times daily as needed (for palpations and heart rate greater than 140 per minute).  . Omega-3 Fatty Acids (FISH OIL) 1000 MG CAPS Take 1,000 mg by mouth daily.  . sotalol (  BETAPACE) 80 MG tablet Take 80 mg by mouth 2 (two) times daily.  . traMADol (ULTRAM) 50 MG tablet Take 1 tablet (50 mg total) by mouth every 6 (six) hours as needed for moderate pain.  . vitamin B-12 (CYANOCOBALAMIN) 1000 MCG tablet Take 1,000 mcg by mouth daily.  . Vitamin D, Ergocalciferol, (DRISDOL) 50000 UNITS CAPS capsule Take 50,000 Units by mouth every 7 (seven) days. On Saturday  . warfarin (COUMADIN) 5 MG tablet Take 1.5 tablets (7.5 mg total) by mouth daily. Resume on 05/30/2016    Allergies:   Codeine; Adhesive [tape]; and Latex   Social History   Social History  . Marital status: Married    Spouse name: N/A  . Number of children: 2  . Years of education: N/A   Occupational History  .  [place] [employer] Main  Topics  . Smoking status: Former Smoker    Years: 4.00    Types: Cigarettes  . Smokeless tobacco: Former Systems developer    Quit date: 05/15/1974     Comment: quit 35-40 years ago  . Alcohol use No  . Drug use: No  . Sexual activity: Not Currently    Birth control/ protection: Post-menopausal   Other Topics Concern  . None   Social History Narrative  . None     Family History:  The patient's family history includes Brain cancer in her mother; Breast cancer in her paternal aunt and paternal aunt; Diabetes in her maternal grandmother; Heart attack in her maternal grandmother; Hypertension in her brother, mother, sister, and sister; Melanoma in her paternal aunt; Multiple myeloma in her paternal aunt; Other (age of onset: 22) in her daughter; Thyroid disease in her brother and sister.   ROS:   Please see the history of present illness.    ROS All other systems reviewed and are negative.  No flowsheet data found.     PHYSICAL EXAM:   VS:  BP 128/62   Pulse 64   Ht 5' 2.5" (1.588 m)   Wt 200 lb (90.7 kg)   LMP 11/21/2010 Comment: spotting-had hysteroscopy/d&c  BMI 36.00 kg/m    GEN: Well nourished, well developed, in no acute distress  HEENT: normal  Neck: no JVD, carotid bruits, or masses Cardiac: RRR; no murmurs, rubs, or gallops,no edema.  Intact distal pulses bilaterally.  Respiratory:  clear to auscultation bilaterally, normal work of breathing GI: soft, nontender, nondistended, + BS MS: no deformity or atrophy  Skin: warm and dry, no rash Neuro:  Alert and Oriented x 3, Strength and sensation are intact Psych: euthymic mood, full affect  Wt Readings from Last 3 Encounters:  12/04/16 200 lb (90.7 kg)  11/11/16 204 lb 12.8 oz (92.9 kg)  07/01/16 191 lb (86.6 kg)      Studies/Labs Reviewed:   EKG:  EKG is not ordered today.    Recent Labs: 05/25/2016: Magnesium 1.9; TSH 0.449 11/06/2016: ALT 26; BUN 18.5; Creatinine 0.8; HGB 13.6; Platelets 228; Potassium 4.8; Sodium  138   Lipid Panel No results found for: CHOL, TRIG, HDL, CHOLHDL, VLDL, LDLCALC, LDLDIRECT  Additional studies/ records that were reviewed today include:  none    ASSESSMENT:    1. Paroxysmal atrial fibrillation (HCC)   2. Benign essential HTN   3. OSA (obstructive sleep apnea)      PLAN:  In order of problems listed above:  1. Paroxysmal atrial fibrillation - she is maintaining NSR.  She will continue on BB, Betapace and warfarin.  2. HTN - her BP is adequately controlled in the office today. She will continue on BB and diuretic. Recent BMET was normal.   OSA - the patient is tolerating PAP therapy well without any problems. The patient has been using and benefiting from CPAP use and will continue to benefit from therapy. Her machine is old so I will have her DME exchange out her machine for another machine that calculates AHI for 2 weeks.  I will call in a Rx for a new CPAP device but she wants to wait until she gets on Medicare in June.      Medication Adjustments/Labs and Tests Ordered: Current medicines are reviewed at length with the patient today.  Concerns regarding medicines are outlined above.  Medication changes, Labs and Tests ordered today are listed in the Patient Instructions below.  There are no Patient Instructions on file for this visit.   Signed, Fransico Him, MD  12/04/2016 3:31 PM    [facility] [address], [place], [place]  [address] Phone: [phone]; Fax: [phone]

## 2016-12-04 NOTE — Patient Instructions (Signed)
Medication Instructions:  Your physician recommends that you continue on your current medications as directed. Please refer to the Current Medication list given to you today.   Labwork: None  Testing/Procedures: None  Follow-Up: Your physician wants you to follow-up in: 1 year with Dr. Radford Pax. You will receive a reminder letter in the mail two months in advance. If you don't receive a letter, please call our office to schedule the follow-up appointment.   Any Other Special Instructions Will Be Listed Below (If Applicable). [facility] will be in touch with you soon to discuss letting you borrow a machine.    If you need a refill on your cardiac medications before your next appointment, please call your pharmacy.

## 2016-12-09 ENCOUNTER — Telehealth: Payer: Self-pay | Admitting: *Deleted

## 2016-12-09 DIAGNOSIS — G4733 Obstructive sleep apnea (adult) (pediatric): Secondary | ICD-10-CM

## 2016-12-09 NOTE — Telephone Encounter (Signed)
-----   Message from Darlina Guys sent at 12/05/2016  2:52 PM EDT ----- Regarding: RE: download We would just need an order for a "2 week auto loaner (with pressure settings included) for optimal pressure. Download in 2 weeks."  ----- Message ----- From: Theodoro Parma, RN Sent: 12/04/2016   3:49 PM To: Jiles Crocker, Darlina Guys, # Subject: download                                       Dr. Radford Pax requests to give this patient a loaner machine for 2 weeks so we can get her AHI since her machine won't give her one.  How do we go about doing this?  Please let Gae Bon know if she needs to do anything to make this happen.  Thanks!

## 2016-12-09 NOTE — Telephone Encounter (Addendum)
Per Dr. Radford Pax 2 week auto titration 4 to 18cm H20 ordered for the patient and get a download in 2 weeks to get her AHI Informed patient of up coming 2 week titration and she verbalized understanding. Patient understands she will be contacted by [facility]. Patient agrees with the treatment plan and thanked me for the call.

## 2016-12-16 ENCOUNTER — Ambulatory Visit: Payer: BC Managed Care – PPO | Admitting: Obstetrics & Gynecology

## 2016-12-26 ENCOUNTER — Ambulatory Visit (INDEPENDENT_AMBULATORY_CARE_PROVIDER_SITE_OTHER): Payer: BC Managed Care – PPO | Admitting: Obstetrics & Gynecology

## 2016-12-26 ENCOUNTER — Encounter: Payer: Self-pay | Admitting: Obstetrics & Gynecology

## 2016-12-26 VITALS — BP 102/66 | HR 60 | Resp 16 | Ht 62.5 in | Wt 202.0 lb

## 2016-12-26 DIAGNOSIS — Z01419 Encounter for gynecological examination (general) (routine) without abnormal findings: Secondary | ICD-10-CM | POA: Diagnosis not present

## 2016-12-26 DIAGNOSIS — Z0000 Encounter for general adult medical examination without abnormal findings: Secondary | ICD-10-CM | POA: Diagnosis not present

## 2016-12-26 LAB — POCT URINALYSIS DIPSTICK
Bilirubin, UA: NEGATIVE
Glucose, UA: NEGATIVE
KETONES UA: NEGATIVE
Nitrite, UA: NEGATIVE
PH UA: 7 (ref 5.0–8.0)
PROTEIN UA: NEGATIVE
UROBILINOGEN UA: NEGATIVE U/dL — AB

## 2016-12-26 NOTE — Patient Instructions (Signed)
Check about last tetanus shot with Dr. Forde Dandy next week.   Make sure you've had a hepatitis C test done as well.
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
## 2017-02-09 ENCOUNTER — Other Ambulatory Visit: Payer: Self-pay | Admitting: Internal Medicine

## 2017-02-11 DIAGNOSIS — I480 Paroxysmal atrial fibrillation: Secondary | ICD-10-CM | POA: Diagnosis not present

## 2017-02-11 DIAGNOSIS — Z7901 Long term (current) use of anticoagulants: Secondary | ICD-10-CM | POA: Diagnosis not present

## 2017-03-18 ENCOUNTER — Ambulatory Visit: Payer: BC Managed Care – PPO | Admitting: Obstetrics & Gynecology

## 2017-03-24 NOTE — Telephone Encounter (Signed)
Per Ailene Ravel, patient did not get original machine from [facility] and was given the option to rent one for $92 but patient never responded. Reached out to the patient [facility]

## 2017-03-25 DIAGNOSIS — I480 Paroxysmal atrial fibrillation: Secondary | ICD-10-CM | POA: Diagnosis not present

## 2017-03-25 DIAGNOSIS — M238X9 Other internal derangements of unspecified knee: Secondary | ICD-10-CM | POA: Diagnosis not present

## 2017-03-25 DIAGNOSIS — Z7901 Long term (current) use of anticoagulants: Secondary | ICD-10-CM | POA: Diagnosis not present

## 2017-03-25 NOTE — Telephone Encounter (Signed)
Reached out to the patient and she informed me her insurance was medicare. Reached out to [employer] and they will lend the patient a machine to do the 2 week auto titration and send our office the download in 2 weeks. 2 week autotitration order has been sent to [facility]. Patient agrees with the treatment.

## 2017-04-02 DIAGNOSIS — Z1211 Encounter for screening for malignant neoplasm of colon: Secondary | ICD-10-CM | POA: Diagnosis not present

## 2017-04-02 DIAGNOSIS — Z1212 Encounter for screening for malignant neoplasm of rectum: Secondary | ICD-10-CM | POA: Diagnosis not present

## 2017-04-08 DIAGNOSIS — Z7901 Long term (current) use of anticoagulants: Secondary | ICD-10-CM | POA: Diagnosis not present

## 2017-04-08 DIAGNOSIS — I480 Paroxysmal atrial fibrillation: Secondary | ICD-10-CM | POA: Diagnosis not present

## 2017-04-11 ENCOUNTER — Telehealth: Payer: Self-pay | Admitting: *Deleted

## 2017-04-11 ENCOUNTER — Other Ambulatory Visit: Payer: Self-pay | Admitting: *Deleted

## 2017-04-11 DIAGNOSIS — Z1211 Encounter for screening for malignant neoplasm of colon: Secondary | ICD-10-CM

## 2017-04-11 LAB — COLOGUARD: Cologuard: NEGATIVE

## 2017-04-11 NOTE — Telephone Encounter (Signed)
Detailed message left per DPR about negative cologuard results. Advised patient to return call to office if she had any questions.   Patient agreeable to disposition. Will close encounter.

## 2017-04-24 DIAGNOSIS — N6311 Unspecified lump in the right breast, upper outer quadrant: Secondary | ICD-10-CM | POA: Diagnosis not present

## 2017-05-12 ENCOUNTER — Encounter: Payer: Self-pay | Admitting: Obstetrics & Gynecology

## 2017-05-14 DIAGNOSIS — I480 Paroxysmal atrial fibrillation: Secondary | ICD-10-CM | POA: Diagnosis not present

## 2017-05-14 DIAGNOSIS — Z7901 Long term (current) use of anticoagulants: Secondary | ICD-10-CM | POA: Diagnosis not present

## 2017-05-28 ENCOUNTER — Telehealth: Payer: Self-pay | Admitting: Cardiology

## 2017-05-28 ENCOUNTER — Telehealth: Payer: Self-pay | Admitting: Internal Medicine

## 2017-05-28 NOTE — Telephone Encounter (Signed)
NEw Message  Pt call requesting to speak with RN about getting a sooner appt. Pt states she has went into afib a couple of times and she feels she needs to be seen as soon as possible. Please call back to discuss

## 2017-05-28 NOTE — Telephone Encounter (Signed)
Per pt has had 3 episodes of a fib in the last month no other symptoms at this time. Pt requesting appt  After reviewing pt's chart appears pt is over due for an appt was suppose to see Chanetta Marshall in 3 months and Dr Rayann Heman in 6 months from 06/2016  appt . Appt made see appt ./cy

## 2017-05-28 NOTE — Telephone Encounter (Signed)
New Message  Pt call requesting to speak with Rn about cpap results. Please call back to discuss

## 2017-05-29 NOTE — Telephone Encounter (Signed)
Reached out to the patient to inform her that her results are not in as of yet and I will contact her with the results as soon as I get them. Patient agreed and thanked me.
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
## 2017-06-09 ENCOUNTER — Ambulatory Visit (HOSPITAL_BASED_OUTPATIENT_CLINIC_OR_DEPARTMENT_OTHER): Payer: Medicare Other

## 2017-06-09 ENCOUNTER — Telehealth: Payer: Self-pay | Admitting: Oncology

## 2017-06-09 ENCOUNTER — Ambulatory Visit (HOSPITAL_BASED_OUTPATIENT_CLINIC_OR_DEPARTMENT_OTHER): Payer: BC Managed Care – PPO | Admitting: Oncology

## 2017-06-09 VITALS — BP 150/57 | HR 72 | Temp 97.6°F | Resp 18 | Ht 62.5 in | Wt 216.4 lb

## 2017-06-09 DIAGNOSIS — Z170 Estrogen receptor positive status [ER+]: Secondary | ICD-10-CM | POA: Diagnosis not present

## 2017-06-09 DIAGNOSIS — C50411 Malignant neoplasm of upper-outer quadrant of right female breast: Secondary | ICD-10-CM

## 2017-06-09 DIAGNOSIS — Z79811 Long term (current) use of aromatase inhibitors: Secondary | ICD-10-CM

## 2017-06-09 DIAGNOSIS — Z7901 Long term (current) use of anticoagulants: Secondary | ICD-10-CM | POA: Diagnosis not present

## 2017-06-09 LAB — CBC WITH DIFFERENTIAL/PLATELET
BASO%: 0.7 % (ref 0.0–2.0)
Basophils Absolute: 0.1 10*3/uL (ref 0.0–0.1)
EOS ABS: 0.3 10*3/uL (ref 0.0–0.5)
EOS%: 3 % (ref 0.0–7.0)
HCT: 40 % (ref 34.8–46.6)
HEMOGLOBIN: 13.6 g/dL (ref 11.6–15.9)
LYMPH%: 13.5 % — ABNORMAL LOW (ref 14.0–49.7)
MCH: 31.6 pg (ref 25.1–34.0)
MCHC: 34 g/dL (ref 31.5–36.0)
MCV: 93 fL (ref 79.5–101.0)
MONO#: 1 10*3/uL — ABNORMAL HIGH (ref 0.1–0.9)
MONO%: 10.5 % (ref 0.0–14.0)
NEUT%: 72.3 % (ref 38.4–76.8)
NEUTROS ABS: 6.7 10*3/uL — AB (ref 1.5–6.5)
PLATELETS: 209 10*3/uL (ref 145–400)
RBC: 4.3 10*6/uL (ref 3.70–5.45)
RDW: 13.4 % (ref 11.2–14.5)
WBC: 9.3 10*3/uL (ref 3.9–10.3)
lymph#: 1.3 10*3/uL (ref 0.9–3.3)

## 2017-06-09 LAB — COMPREHENSIVE METABOLIC PANEL
ALBUMIN: 3.8 g/dL (ref 3.5–5.0)
ALT: 34 U/L (ref 0–55)
ANION GAP: 9 meq/L (ref 3–11)
AST: 27 U/L (ref 5–34)
Alkaline Phosphatase: 157 U/L — ABNORMAL HIGH (ref 40–150)
BILIRUBIN TOTAL: 0.69 mg/dL (ref 0.20–1.20)
BUN: 13.9 mg/dL (ref 7.0–26.0)
CO2: 28 mEq/L (ref 22–29)
CREATININE: 0.7 mg/dL (ref 0.6–1.1)
Calcium: 9.7 mg/dL (ref 8.4–10.4)
Chloride: 102 mEq/L (ref 98–109)
GLUCOSE: 94 mg/dL (ref 70–140)
Potassium: 3.8 mEq/L (ref 3.5–5.1)
Sodium: 140 mEq/L (ref 136–145)
TOTAL PROTEIN: 7.6 g/dL (ref 6.4–8.3)

## 2017-06-09 LAB — PROTIME-INR
INR: 2 (ref 2.00–3.50)
Protime: 24 Seconds — ABNORMAL HIGH (ref 10.6–13.4)

## 2017-06-09 NOTE — Telephone Encounter (Signed)
Gave patient avs and calendar per 10/29 los.
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
## 2017-06-18 ENCOUNTER — Ambulatory Visit (INDEPENDENT_AMBULATORY_CARE_PROVIDER_SITE_OTHER): Payer: Medicare Other | Admitting: Nurse Practitioner

## 2017-06-18 ENCOUNTER — Encounter: Payer: Self-pay | Admitting: Nurse Practitioner

## 2017-06-18 VITALS — BP 120/54 | HR 69 | Ht 62.5 in | Wt 209.0 lb

## 2017-06-18 DIAGNOSIS — I10 Essential (primary) hypertension: Secondary | ICD-10-CM | POA: Diagnosis not present

## 2017-06-18 DIAGNOSIS — I480 Paroxysmal atrial fibrillation: Secondary | ICD-10-CM

## 2017-06-18 DIAGNOSIS — G4733 Obstructive sleep apnea (adult) (pediatric): Secondary | ICD-10-CM

## 2017-06-18 NOTE — Patient Instructions (Addendum)
Medication Instructions:   Your physician recommends that you continue on your current medications as directed. Please refer to the Current Medication list given to you today.    If you need a refill on your cardiac medications before your next appointment, please call your pharmacy.  Labwork: NONE ORDERED  TODAY    Testing/Procedures: NONE ORDERED  TODAY    Follow-Up:  Your physician wants you to follow-up in:  IN  6 MONTHS WITH DR ALLRED   You will receive a reminder letter in the mail two months in advance. If you don't receive a letter, please call our office to schedule the follow-up appointment.      Any Other Special Instructions Will Be Listed Below (If Applicable).                                                                                                                                                   

## 2017-06-19 ENCOUNTER — Other Ambulatory Visit: Payer: Self-pay | Admitting: *Deleted

## 2017-06-19 MED ORDER — ANASTROZOLE 1 MG PO TABS: 1.0000 mg | ORAL_TABLET | Freq: Every day | ORAL | 4 refills | Status: DC

## 2017-06-22 NOTE — Telephone Encounter (Signed)
Please order a ResMed CPAP at 8cm H2O with heated humidity

## 2017-06-23 NOTE — Addendum Note (Signed)
Addended by: Freada Bergeron on: 06/23/2017 09:12 AM   Modules accepted: Orders

## 2017-06-23 NOTE — Addendum Note (Signed)
Addended by: Freada Bergeron on: 06/23/2017 09:10 AM   Modules accepted: Orders

## 2017-06-23 NOTE — Telephone Encounter (Signed)
a ResMed CPAP at 8cm H2O with heated humidity has been ordered and faxed to [employer].

## 2017-06-24 ENCOUNTER — Encounter: Payer: Self-pay | Admitting: *Deleted

## 2017-06-24 NOTE — Telephone Encounter (Signed)
Per Dr Radford Pax.[name]"Note     a ResMed CPAP at 8cm H2O with heated humidity has been ordered and faxed to [facility].    June 22, 2017  Sueanne Margarita, MD    Please order a ResMed CPAP at 8cm H2O with heated humidity     Patient has been notified

## 2017-06-30 DIAGNOSIS — G4730 Sleep apnea, unspecified: Secondary | ICD-10-CM | POA: Diagnosis not present

## 2017-06-30 DIAGNOSIS — R7302 Impaired glucose tolerance (oral): Secondary | ICD-10-CM | POA: Diagnosis not present

## 2017-06-30 DIAGNOSIS — C50411 Malignant neoplasm of upper-outer quadrant of right female breast: Secondary | ICD-10-CM | POA: Diagnosis not present

## 2017-06-30 DIAGNOSIS — R05 Cough: Secondary | ICD-10-CM | POA: Diagnosis not present

## 2017-06-30 DIAGNOSIS — Z6837 Body mass index (BMI) 37.0-37.9, adult: Secondary | ICD-10-CM | POA: Diagnosis not present

## 2017-06-30 DIAGNOSIS — E0500 Thyrotoxicosis with diffuse goiter without thyrotoxic crisis or storm: Secondary | ICD-10-CM | POA: Diagnosis not present

## 2017-06-30 DIAGNOSIS — E7849 Other hyperlipidemia: Secondary | ICD-10-CM | POA: Diagnosis not present

## 2017-06-30 DIAGNOSIS — I480 Paroxysmal atrial fibrillation: Secondary | ICD-10-CM | POA: Diagnosis not present

## 2017-06-30 DIAGNOSIS — I10 Essential (primary) hypertension: Secondary | ICD-10-CM | POA: Diagnosis not present

## 2017-06-30 DIAGNOSIS — E559 Vitamin D deficiency, unspecified: Secondary | ICD-10-CM | POA: Diagnosis not present

## 2017-06-30 DIAGNOSIS — Z7901 Long term (current) use of anticoagulants: Secondary | ICD-10-CM | POA: Diagnosis not present

## 2017-07-07 DIAGNOSIS — Z7901 Long term (current) use of anticoagulants: Secondary | ICD-10-CM | POA: Diagnosis not present

## 2017-07-07 DIAGNOSIS — I480 Paroxysmal atrial fibrillation: Secondary | ICD-10-CM | POA: Diagnosis not present

## 2017-07-14 DIAGNOSIS — Z7901 Long term (current) use of anticoagulants: Secondary | ICD-10-CM | POA: Diagnosis not present

## 2017-07-14 DIAGNOSIS — I480 Paroxysmal atrial fibrillation: Secondary | ICD-10-CM | POA: Diagnosis not present

## 2017-07-18 DIAGNOSIS — Z7901 Long term (current) use of anticoagulants: Secondary | ICD-10-CM | POA: Diagnosis not present

## 2017-07-18 DIAGNOSIS — I480 Paroxysmal atrial fibrillation: Secondary | ICD-10-CM | POA: Diagnosis not present

## 2017-08-23 ENCOUNTER — Inpatient Hospital Stay (HOSPITAL_COMMUNITY)
Admission: EM | Admit: 2017-08-23 | Discharge: 2017-08-26 | DRG: 310 | Disposition: A | Discharge status: Home / Self Care | Payer: Medicare Other | Attending: Internal Medicine | Admitting: Internal Medicine

## 2017-08-23 ENCOUNTER — Emergency Department (HOSPITAL_COMMUNITY): Payer: Medicare Other

## 2017-08-23 ENCOUNTER — Encounter (HOSPITAL_COMMUNITY): Payer: Self-pay | Admitting: Emergency Medicine

## 2017-08-23 ENCOUNTER — Other Ambulatory Visit: Payer: Self-pay

## 2017-08-23 DIAGNOSIS — E0590 Thyrotoxicosis, unspecified without thyrotoxic crisis or storm: Secondary | ICD-10-CM | POA: Diagnosis present

## 2017-08-23 DIAGNOSIS — Z79899 Other long term (current) drug therapy: Secondary | ICD-10-CM

## 2017-08-23 DIAGNOSIS — Z808 Family history of malignant neoplasm of other organs or systems: Secondary | ICD-10-CM

## 2017-08-23 DIAGNOSIS — M170 Bilateral primary osteoarthritis of knee: Secondary | ICD-10-CM | POA: Diagnosis present

## 2017-08-23 DIAGNOSIS — Z85828 Personal history of other malignant neoplasm of skin: Secondary | ICD-10-CM

## 2017-08-23 DIAGNOSIS — Z8349 Family history of other endocrine, nutritional and metabolic diseases: Secondary | ICD-10-CM

## 2017-08-23 DIAGNOSIS — Z91048 Other nonmedicinal substance allergy status: Secondary | ICD-10-CM | POA: Diagnosis not present

## 2017-08-23 DIAGNOSIS — R197 Diarrhea, unspecified: Secondary | ICD-10-CM | POA: Diagnosis present

## 2017-08-23 DIAGNOSIS — I471 Supraventricular tachycardia: Secondary | ICD-10-CM | POA: Diagnosis present

## 2017-08-23 DIAGNOSIS — Z79891 Long term (current) use of opiate analgesic: Secondary | ICD-10-CM

## 2017-08-23 DIAGNOSIS — I480 Paroxysmal atrial fibrillation: Secondary | ICD-10-CM | POA: Diagnosis present

## 2017-08-23 DIAGNOSIS — F419 Anxiety disorder, unspecified: Secondary | ICD-10-CM | POA: Diagnosis present

## 2017-08-23 DIAGNOSIS — M19071 Primary osteoarthritis, right ankle and foot: Secondary | ICD-10-CM | POA: Diagnosis present

## 2017-08-23 DIAGNOSIS — Z23 Encounter for immunization: Secondary | ICD-10-CM

## 2017-08-23 DIAGNOSIS — E785 Hyperlipidemia, unspecified: Secondary | ICD-10-CM | POA: Diagnosis present

## 2017-08-23 DIAGNOSIS — E669 Obesity, unspecified: Secondary | ICD-10-CM | POA: Diagnosis present

## 2017-08-23 DIAGNOSIS — I10 Essential (primary) hypertension: Secondary | ICD-10-CM | POA: Diagnosis present

## 2017-08-23 DIAGNOSIS — Z6838 Body mass index (BMI) 38.0-38.9, adult: Secondary | ICD-10-CM

## 2017-08-23 DIAGNOSIS — F329 Major depressive disorder, single episode, unspecified: Secondary | ICD-10-CM | POA: Diagnosis present

## 2017-08-23 DIAGNOSIS — I4892 Unspecified atrial flutter: Secondary | ICD-10-CM | POA: Diagnosis present

## 2017-08-23 DIAGNOSIS — M19072 Primary osteoarthritis, left ankle and foot: Secondary | ICD-10-CM | POA: Diagnosis present

## 2017-08-23 DIAGNOSIS — K219 Gastro-esophageal reflux disease without esophagitis: Secondary | ICD-10-CM | POA: Diagnosis present

## 2017-08-23 DIAGNOSIS — Z833 Family history of diabetes mellitus: Secondary | ICD-10-CM

## 2017-08-23 DIAGNOSIS — G4733 Obstructive sleep apnea (adult) (pediatric): Secondary | ICD-10-CM | POA: Diagnosis present

## 2017-08-23 DIAGNOSIS — Z7901 Long term (current) use of anticoagulants: Secondary | ICD-10-CM

## 2017-08-23 DIAGNOSIS — I481 Persistent atrial fibrillation: Secondary | ICD-10-CM | POA: Diagnosis present

## 2017-08-23 DIAGNOSIS — I472 Ventricular tachycardia: Secondary | ICD-10-CM | POA: Diagnosis present

## 2017-08-23 DIAGNOSIS — Z885 Allergy status to narcotic agent status: Secondary | ICD-10-CM | POA: Diagnosis not present

## 2017-08-23 DIAGNOSIS — Z853 Personal history of malignant neoplasm of breast: Secondary | ICD-10-CM | POA: Diagnosis not present

## 2017-08-23 DIAGNOSIS — Z803 Family history of malignant neoplasm of breast: Secondary | ICD-10-CM

## 2017-08-23 DIAGNOSIS — Z91040 Latex allergy status: Secondary | ICD-10-CM | POA: Diagnosis not present

## 2017-08-23 DIAGNOSIS — Z87891 Personal history of nicotine dependence: Secondary | ICD-10-CM

## 2017-08-23 DIAGNOSIS — E7800 Pure hypercholesterolemia, unspecified: Secondary | ICD-10-CM | POA: Diagnosis present

## 2017-08-23 DIAGNOSIS — Z8249 Family history of ischemic heart disease and other diseases of the circulatory system: Secondary | ICD-10-CM

## 2017-08-23 DIAGNOSIS — Z807 Family history of other malignant neoplasms of lymphoid, hematopoietic and related tissues: Secondary | ICD-10-CM

## 2017-08-23 DIAGNOSIS — I4891 Unspecified atrial fibrillation: Secondary | ICD-10-CM

## 2017-08-23 LAB — TROPONIN I: Troponin I: <0.03 ng/mL (ref <0.03)

## 2017-08-23 LAB — CBC WITH DIFFERENTIAL/PLATELET
Basophils Absolute: 0.1 10*3/uL (ref 0.0–0.1)
Basophils Relative: 1 %
EOS ABS: 0.3 10*3/uL (ref 0.0–0.7)
EOS PCT: 2 %
HCT: 41.3 % (ref 36.0–46.0)
Hemoglobin: 13.8 g/dL (ref 12.0–15.0)
LYMPHS ABS: 1.6 10*3/uL (ref 0.7–4.0)
Lymphocytes Relative: 13 %
MCH: 31.2 pg (ref 26.0–34.0)
MCHC: 33.4 g/dL (ref 30.0–36.0)
MCV: 93.2 fL (ref 78.0–100.0)
MONO ABS: 1.3 10*3/uL — AB (ref 0.1–1.0)
MONOS PCT: 11 %
Neutro Abs: 8.8 10*3/uL — ABNORMAL HIGH (ref 1.7–7.7)
Neutrophils Relative %: 73 %
PLATELETS: 262 10*3/uL (ref 150–400)
RBC: 4.43 MIL/uL (ref 3.87–5.11)
RDW: 13.3 % (ref 11.5–15.5)
WBC: 12 10*3/uL — ABNORMAL HIGH (ref 4.0–10.5)

## 2017-08-23 LAB — BASIC METABOLIC PANEL
Anion gap: 12 (ref 5–15)
BUN: 10 mg/dL (ref 6–20)
CO2: 21 mmol/L — AB (ref 22–32)
CREATININE: 0.56 mg/dL (ref 0.44–1.00)
Calcium: 8.9 mg/dL (ref 8.9–10.3)
Chloride: 103 mmol/L (ref 101–111)
GFR calc Af Amer: >60 mL/min (ref >60)
GFR calc non Af Amer: >60 mL/min (ref >60)
GLUCOSE: 105 mg/dL — AB (ref 65–99)
Potassium: 3.9 mmol/L (ref 3.5–5.1)
SODIUM: 136 mmol/L (ref 135–145)

## 2017-08-23 LAB — PROTIME-INR
INR: 2.26
Prothrombin Time: 24.8 seconds — ABNORMAL HIGH (ref 11.4–15.2)

## 2017-08-23 LAB — MAGNESIUM: Magnesium: 2.1 mg/dL (ref 1.7–2.4)

## 2017-08-23 MED ORDER — ATORVASTATIN CALCIUM 20 MG PO TABS
20.0000 mg | ORAL_TABLET | Freq: Every day | ORAL | Status: DC
  Administered 2017-08-24 – 2017-08-26 (×3): 20 mg via ORAL
  Filled 2017-08-23 (×3): qty 1

## 2017-08-23 MED ORDER — AMIODARONE HCL IN DEXTROSE 360-4.14 MG/200ML-% IV SOLN
60.0000 mg/h | INTRAVENOUS | Status: AC
  Administered 2017-08-23: 60 mg/h via INTRAVENOUS
  Filled 2017-08-23: qty 200

## 2017-08-23 MED ORDER — HYDROCHLOROTHIAZIDE 12.5 MG PO CAPS
12.5000 mg | ORAL_CAPSULE | Freq: Every day | ORAL | Status: DC
  Administered 2017-08-24 – 2017-08-26 (×3): 12.5 mg via ORAL
  Filled 2017-08-23 (×3): qty 1

## 2017-08-23 MED ORDER — ONDANSETRON HCL 4 MG/2ML IJ SOLN: 4.0000 mg | Freq: Four times a day (QID) | INTRAMUSCULAR | Status: DC | PRN

## 2017-08-23 MED ORDER — AMIODARONE HCL IN DEXTROSE 360-4.14 MG/200ML-% IV SOLN
30.0000 mg/h | INTRAVENOUS | Status: DC
  Administered 2017-08-24: 30 mg/h via INTRAVENOUS
  Filled 2017-08-23: qty 200

## 2017-08-23 MED ORDER — METOPROLOL TARTRATE 5 MG/5ML IV SOLN
5.0000 mg | Freq: Once | INTRAVENOUS | Status: AC
  Administered 2017-08-23: 5 mg via INTRAVENOUS
  Filled 2017-08-23: qty 5

## 2017-08-23 MED ORDER — METOPROLOL TARTRATE 25 MG PO TABS
25.0000 mg | ORAL_TABLET | Freq: Four times a day (QID) | ORAL | Status: DC
  Administered 2017-08-23 – 2017-08-26 (×10): 25 mg via ORAL
  Filled 2017-08-23 (×10): qty 1

## 2017-08-23 MED ORDER — ACETAMINOPHEN 325 MG PO TABS: 650.0000 mg | ORAL_TABLET | ORAL | Status: DC | PRN

## 2017-08-23 MED ORDER — SOTALOL HCL 80 MG PO TABS: 80.0000 mg | ORAL_TABLET | Freq: Two times a day (BID) | ORAL | Status: DC

## 2017-08-23 NOTE — Plan of Care (Signed)
Patient alert and oriented in no apparent distress, husband at the bedside. Patient oriented to room and equipment, pt in NSR, artrial arrhyhtmia clinical pathway initiated.

## 2017-08-23 NOTE — Progress Notes (Signed)
  Amiodarone Drug - Drug Interaction Consult Note  Recommendations: No changes to current regimens. Monitor vital signs. On atorvastatin, HCTZ, warfarin, and sotalol prior to admission - not currently reordered.   Amiodarone is metabolized by the cytochrome P450 system and therefore has the potential to cause many drug interactions. Amiodarone has an average plasma half-life of 50 days (range 20 to 100 days).   There is potential for drug interactions to occur several weeks or months after stopping treatment and the onset of drug interactions may be slow after initiating amiodarone.   []  Statins: Increased risk of myopathy. Simvastatin- restrict dose to 20mg  daily. Other statins: counsel patients to report any muscle pain or weakness immediately.  []  Anticoagulants: Amiodarone can increase anticoagulant effect. Consider warfarin dose reduction. Patients should be monitored closely and the dose of anticoagulant altered accordingly, remembering that amiodarone levels take several weeks to stabilize.  []  Antiepileptics: Amiodarone can increase plasma concentration of phenytoin, the dose should be reduced. Note that small changes in phenytoin dose can result in large changes in levels. Monitor patient and counsel on signs of toxicity.  [x]  Beta blockers: increased risk of bradycardia, AV block and myocardial depression. Sotalol - avoid concomitant use.  []   Calcium channel blockers (diltiazem and verapamil): increased risk of bradycardia, AV block and myocardial depression.  []   Cyclosporine: Amiodarone increases levels of cyclosporine. Reduced dose of cyclosporine is recommended.  []  Digoxin dose should be halved when amiodarone is started.  []  Diuretics: increased risk of cardiotoxicity if hypokalemia occurs.  []  Oral hypoglycemic agents (glyburide, glipizide, glimepiride): increased risk of hypoglycemia. Patient's glucose levels should be monitored closely when initiating amiodarone therapy.    []  Drugs that prolong the QT interval:  Torsades de pointes risk may be increased with concurrent use - avoid if possible.  Monitor QTc, also keep magnesium/potassium WNL if concurrent therapy can't be avoided. [name] Antibiotics: e.g. fluoroquinolones, erythromycin. . Antiarrhythmics: e.g. quinidine, procainamide, disopyramide, sotalol. . Antipsychotics: e.g. phenothiazines, haloperidol.  . Lithium, tricyclic antidepressants, and methadone.  Thank You, Doylene Canard, PharmD Clinical Pharmacist  Pager: [phone] Phone: [phone]

## 2017-08-23 NOTE — H&P (Signed)
History and Physical  Primary Cardiologist: Turner/Alred PCP: Reynold Bowen, MD  Chief Complaint: Palpitations  HPI:  67 YO female with history of HTN, HLD, hyperthyroidism, pAF, EP study with a. Flutter s/p ablation on sotalol and coumadin, OSA.   She presents after pre-syncope and palpitations.  This is similar to previous episodes of SVT.  Last she says was several months ago.  Usually, she can rest and it passes or she takes a metoprolol.  Today, more lightheaded, prompting EMS call.  EMS strip shows fast narrow complex tachycardia up to 180s with periodic aberrant conduction.  Got 150 bolus of amiodarone. Largely asymptomatic at this point.   In ED, periodically would flip from NSR to 180s with aberrancy.  All reasonably asymptomatic.  Got 5 mg IV metoprolol which helped.  She denies missing sotalol doses or any other medications.  She states she has had some diarrhea recently, but otherwise no problems.    ROS: + chronic mild edema.  No ABD pain.  Mild URI for a few months.  No chest pain  Prior Cardiac Studies:  Past Medical History:  Diagnosis Date  . A-fib ([place])   . Allergic dermatitis   . Anxiety   . Arthritis    back, knees & ankles   . Breast cancer ([place])   . Cancer ([place])    skin- lower abdomen  . Depression   . GERD (gastroesophageal reflux disease)   . Hiatal hernia    with GERD  . Hypercholesteremia   . Hypertension   . Hyperthyroidism    pt. reports that she thinks she has hypothyroidism, states MD- [place] took her off med. in the past 1-2 yrs.   . Mild cognitive impairment with memory loss   . Morbid obesity ([place])   . Sleep apnea    severe with AHI 37/hr now on CPAP at 13cm H2O  . SVT (supraventricular tachycardia) ([place])    likely due to atrial flutter.  successfullly ablated by Dr Lovena Le 10/17    Past Surgical History:  Procedure Laterality Date  . ABLATION  06/2016   Dr. Delilah Shan  . APPENDECTOMY  28 years ago  . DILATION AND CURETTAGE OF  UTERUS  1996    Polyp resection   . ELECTROPHYSIOLOGIC STUDY N/A 05/27/2016   Procedure: EPS/SVT Ablation;  Surgeon: Evans Lance, MD;  Location: [facility];  Service: Cardiovascular;  Laterality: N/A;  . HYSTEROSCOPY  4/12   D&C  . RADIOACTIVE SEED GUIDED PARTIAL MASTECTOMY WITH AXILLARY SENTINEL LYMPH NODE BIOPSY Right 05/18/2015   Procedure: RIGHT BREAST RADIOACTIVE SEED PARTIAL MASTECTOMY WITH RIGHT SENTINEL LYMPH NODE MAPPING;  Surgeon: Erroll Luna, MD;  Location: [facility];  Service: General;  Laterality: Right;  . TONSILLECTOMY    . TUBAL LIGATION      Family History  Problem Relation Age of Onset  . Hypertension Mother   . Brain cancer Mother   . Hypertension Sister   . Thyroid disease Sister   . Hypertension Sister   . Hypertension Brother   . Thyroid disease Brother   . Diabetes Maternal Grandmother   . Heart attack Maternal Grandmother   . Breast cancer Paternal Aunt        dx in her late 106s- early 82s  . Melanoma Paternal Aunt   . Breast cancer Paternal Aunt   . Multiple myeloma Paternal Aunt   . Other Daughter 23       ITP   Social History:  reports that she  has quit smoking. Her smoking use included cigarettes. She quit after 4.00 years of use. She quit smokeless tobacco use about 43 years ago. She reports that she does not drink alcohol or use drugs.  Allergies:  Allergies  Allergen Reactions  . Codeine Swelling    Facial/lips  . Adhesive [Tape] Itching and Rash    MUST USE PAPER TAPE-CAUSES BLISTERS AND BRUISES ALSO  . Latex Itching and Rash    No current facility-administered medications on file prior to encounter.    Current Outpatient Medications on File Prior to Encounter  Medication Sig Dispense Refill  . acetaminophen (TYLENOL) 500 MG tablet Take 500 mg by mouth every 6 (six) hours as needed for pain.    [name] anastrozole (ARIMIDEX) 1 MG tablet Take 1 tablet (1 mg total) daily by mouth. 90 tablet 4  . atorvastatin (LIPITOR) 20 MG tablet Take  20 mg by mouth daily after breakfast.     . metoprolol tartrate (LOPRESSOR) 25 MG tablet Take 25 mg as directed by mouth. Take if palpations and heart rate are greater than 140 beats per minute. Can take up to three times daily    . Omega-3 Fatty Acids (FISH OIL) 1000 MG CAPS Take 1,000 mg by mouth daily.    . sotalol (BETAPACE) 80 MG tablet Take 80 mg by mouth 2 (two) times daily.    . traMADol (ULTRAM) 50 MG tablet Take 1 tablet (50 mg total) by mouth every 6 (six) hours as needed for moderate pain. 30 tablet 0  . vitamin B-12 (CYANOCOBALAMIN) 1000 MCG tablet Take 1,000 mcg by mouth daily.    . Vitamin D, Ergocalciferol, (DRISDOL) 50000 UNITS CAPS capsule Take 50,000 Units by mouth every 7 (seven) days. On Saturday    . warfarin (COUMADIN) 5 MG tablet Take 1.5 tablets (7.5 mg total) by mouth daily. Resume on 05/30/2016 (Patient taking differently: Take 5-7.5 mg by mouth See admin instructions. 3m M W F SAT and 7.549mT TH SUN)      '@medshecduled' @ '@medinfusions' @  Results for orders placed or performed during the hospital encounter of 08/23/17 (from the past 48 hour(s))  CBC with Differential     Status: Abnormal   Collection Time: 08/23/17  6:50 PM  Result Value Ref Range   WBC 12.0 (H) 4.0 - 10.5 K/uL   RBC 4.43 3.87 - 5.11 MIL/uL   Hemoglobin 13.8 12.0 - 15.0 g/dL   HCT 41.3 36.0 - 46.0 %   MCV 93.2 78.0 - 100.0 fL   MCH 31.2 26.0 - 34.0 pg   MCHC 33.4 30.0 - 36.0 g/dL   RDW 13.3 11.5 - 15.5 %   Platelets 262 150 - 400 K/uL   Neutrophils Relative % 73 %   Neutro Abs 8.8 (H) 1.7 - 7.7 K/uL   Lymphocytes Relative 13 %   Lymphs Abs 1.6 0.7 - 4.0 K/uL   Monocytes Relative 11 %   Monocytes Absolute 1.3 (H) 0.1 - 1.0 K/uL   Eosinophils Relative 2 %   Eosinophils Absolute 0.3 0.0 - 0.7 K/uL   Basophils Relative 1 %   Basophils Absolute 0.1 0.0 - 0.1 K/uL  Protime-INR     Status: Abnormal   Collection Time: 08/23/17  6:50 PM  Result Value Ref Range   Prothrombin Time 24.8 (H) 11.4 -  15.2 seconds   INR 2.26    Dg Chest Portable 1 View  Result Date: 08/23/2017 CLINICAL DATA:  Shortness of Breath EXAM: PORTABLE CHEST 1 VIEW COMPARISON:  07/11/2015 FINDINGS: Cardiac shadow is enlarged. The lungs are well aerated bilaterally. No focal infiltrate or sizable effusion is seen. No acute bony abnormality is noted. IMPRESSION: No active disease. Electronically Signed   By: Inez Catalina M.D.   On: 08/23/2017 19:09    ECG/Tele: EMS strip shows fast narrow complex tachycardia up to 180s with periodic aberrant conduction.   ROS: As above. Otherwise, review of systems is negative unless per above HPI  Vitals:   08/23/17 1856 08/23/17 1857  BP: (!) 146/78   Pulse: (!) 143   Resp: 18   Temp: 98.1 F (36.7 C)   TempSrc: Temporal   SpO2: 99%   Weight:  90.7 kg (200 lb)  Height:  5' 2.5" (1.588 m)   Wt Readings from Last 10 Encounters:  08/23/17 90.7 kg (200 lb)  06/18/17 94.8 kg (209 lb)  06/09/17 98.2 kg (216 lb 6.4 oz)  12/26/16 91.6 kg (202 lb)  12/04/16 90.7 kg (200 lb)  11/11/16 92.9 kg (204 lb 12.8 oz)  07/01/16 86.6 kg (191 lb)  05/25/16 90.4 kg (199 lb 6.4 oz)  05/13/16 92.9 kg (204 lb 14.4 oz)  05/02/16 92.9 kg (204 lb 12.8 oz)    PE:  General: No acute distress HEENT: Atraumatic, EOMI, mucous membranes moist. No JVD at 45 degrees. No HJR. CV: RRR no murmurs, gallops. (periodically irr irr)  Respiratory: Clear, no crackles. Normal work of breathing ABD: Obese, Non-distended and non-tender. No palpable organomegaly.  Extremities: 2+ radial pulses bilaterally. 1+ edema. Neuro/Psych: CN grossly intact, alert and oriented  Assessment/Plan Narrow complex SVT (previously fast flutter on EPS s/p ablation) with history of pAF on sotalol/coumadin Brief WCT, favor aberrancy  HTN OSA  Narrow complex SVT (previously fast flutter on EPS s/p ablation) with history of pAF on sotalol/coumadin - 25 mg metop q 6 PO, continue amiodarone infusion overnight, continue  sotalol 80 mg BID (baseline QTc OK) - Discuss with EP tomorrow, may need another EPS/ablation given how fast she conducts versus inc sotalol - Step down admit - coumadin per pharmacy dosing  Brief WCT, favor aberrancy  - As above and monitor  HTN OSA  Full Code   Lolita Cram Means  MD 08/23/2017, 7:44 PM

## 2017-08-23 NOTE — ED Triage Notes (Signed)
To ED via [facility] from home with c/o palpitations -- when EMS arrived, pt was having runs of VTach, pt has had an ablation for similar episodes.

## 2017-08-24 ENCOUNTER — Other Ambulatory Visit: Payer: Self-pay

## 2017-08-24 DIAGNOSIS — I480 Paroxysmal atrial fibrillation: Principal | ICD-10-CM

## 2017-08-24 LAB — CBC
HCT: 40.5 % (ref 36.0–46.0)
HEMOGLOBIN: 13.6 g/dL (ref 12.0–15.0)
MCH: 31.5 pg (ref 26.0–34.0)
MCHC: 33.6 g/dL (ref 30.0–36.0)
MCV: 93.8 fL (ref 78.0–100.0)
Platelets: 242 10*3/uL (ref 150–400)
RBC: 4.32 MIL/uL (ref 3.87–5.11)
RDW: 13.6 % (ref 11.5–15.5)
WBC: 8.9 10*3/uL (ref 4.0–10.5)

## 2017-08-24 LAB — BASIC METABOLIC PANEL
ANION GAP: 11 (ref 5–15)
BUN: 8 mg/dL (ref 6–20)
CALCIUM: 9.2 mg/dL (ref 8.9–10.3)
CO2: 23 mmol/L (ref 22–32)
Chloride: 105 mmol/L (ref 101–111)
Creatinine, Ser: 0.6 mg/dL (ref 0.44–1.00)
Glucose, Bld: 113 mg/dL — ABNORMAL HIGH (ref 65–99)
Potassium: 3.8 mmol/L (ref 3.5–5.1)
SODIUM: 139 mmol/L (ref 135–145)

## 2017-08-24 LAB — HEPATIC FUNCTION PANEL
ALT: 22 U/L (ref 14–54)
AST: 22 U/L (ref 15–41)
Albumin: 3.5 g/dL (ref 3.5–5.0)
Alkaline Phosphatase: 143 U/L — ABNORMAL HIGH (ref 38–126)
Bilirubin, Direct: <0.1 mg/dL — ABNORMAL LOW (ref 0.1–0.5)
TOTAL PROTEIN: 7 g/dL (ref 6.5–8.1)
Total Bilirubin: 0.7 mg/dL (ref 0.3–1.2)

## 2017-08-24 LAB — TSH: TSH: 1.153 u[IU]/mL (ref 0.350–4.500)

## 2017-08-24 LAB — TROPONIN I: Troponin I: <0.03 ng/mL (ref <0.03)

## 2017-08-24 LAB — PROTIME-INR
INR: 2.21
Prothrombin Time: 24.4 seconds — ABNORMAL HIGH (ref 11.4–15.2)

## 2017-08-24 MED ORDER — INFLUENZA VAC SPLIT HIGH-DOSE 0.5 ML IM SUSY
0.5000 mL | PREFILLED_SYRINGE | INTRAMUSCULAR | Status: AC
  Administered 2017-08-25: 0.5 mL via INTRAMUSCULAR
  Filled 2017-08-24: qty 0.5

## 2017-08-24 MED ORDER — AMIODARONE HCL 200 MG PO TABS
400.0000 mg | ORAL_TABLET | Freq: Two times a day (BID) | ORAL | Status: DC
  Administered 2017-08-24 (×2): 400 mg via ORAL
  Filled 2017-08-24 (×3): qty 2

## 2017-08-24 MED ORDER — WARFARIN SODIUM 7.5 MG PO TABS
7.5000 mg | ORAL_TABLET | Freq: Once | ORAL | Status: AC
  Administered 2017-08-24: 7.5 mg via ORAL
  Filled 2017-08-24: qty 1

## 2017-08-24 MED ORDER — WARFARIN - PHARMACIST DOSING INPATIENT: Freq: Every day | Status: DC

## 2017-08-24 MED ORDER — WARFARIN SODIUM 5 MG PO TABS: 5.0000 mg | ORAL_TABLET | Freq: Once | ORAL | Status: DC

## 2017-08-24 NOTE — Progress Notes (Signed)
Progress Note  Patient Name: Courtney Dunlap Date of Encounter: 08/24/2017  Primary Cardiologist: Fransico Him, MD   Subjective   "I feel better"  Inpatient Medications    Scheduled Meds: . amiodarone  400 mg Oral BID  . atorvastatin  20 mg Oral QPC breakfast  . hydrochlorothiazide  12.5 mg Oral Daily  . [START ON 08/25/2017] Influenza vac split quadrivalent PF  0.5 mL Intramuscular Tomorrow-1000  . metoprolol tartrate  25 mg Oral Q6H  . warfarin  7.5 mg Oral ONCE-1800  . Warfarin - Pharmacist Dosing Inpatient   Does not apply q1800   Continuous Infusions:  PRN Meds: acetaminophen, ondansetron (ZOFRAN) IV   Vital Signs    Vitals:   08/23/17 2300 08/23/17 2344 08/24/17 0427 08/24/17 0801  BP: 135/68 119/71  (!) 153/77  Pulse: 67   (!) 57  Resp: 19   18  Temp:  98.2 F (36.8 C) 97.9 F (36.6 C) (!) 97.4 F (36.3 C)  TempSrc:  Oral Oral Oral  SpO2: 93%   95%  Weight:      Height:        Intake/Output Summary (Last 24 hours) at 08/24/2017 1146 Last data filed at 08/24/2017 1028 Gross per 24 hour  Intake 522.48 ml  Output 900 ml  Net -377.52 ml   Filed Weights   08/23/17 1857 08/23/17 2242  Weight: 200 lb (90.7 kg) 213 lb 3.2 oz (96.7 kg)    Telemetry    NSR - Personally Reviewed  ECG    NSR - Personally Reviewed  Physical Exam   GEN: No acute distress.   Neck: No JVD Cardiac: RRR, no murmurs, rubs, or gallops.  Respiratory: Clear to auscultation bilaterally. GI: Soft, nontender, non-distended  MS: No edema; No deformity. Neuro:  Nonfocal  Psych: Normal affect   Labs    Chemistry Recent Labs  Lab 08/23/17 1850 08/24/17 0712  NA 136 139  K 3.9 3.8  CL 103 105  CO2 21* 23  GLUCOSE 105* 113*  BUN 10 8  CREATININE 0.56 0.60  CALCIUM 8.9 9.2  PROT  --  7.0  ALBUMIN  --  3.5  AST  --  22  ALT  --  22  ALKPHOS  --  143*  BILITOT  --  0.7  GFRNONAA >60 >60  GFRAA >60 >60  ANIONGAP 12 11     Hematology Recent Labs  Lab  08/23/17 1850 08/24/17 0712  WBC 12.0* 8.9  RBC 4.43 4.32  HGB 13.8 13.6  HCT 41.3 40.5  MCV 93.2 93.8  MCH 31.2 31.5  MCHC 33.4 33.6  RDW 13.3 13.6  PLT 262 242    Cardiac Enzymes Recent Labs  Lab 08/23/17 1850 08/24/17 0712  TROPONINI <0.03 <0.03   No results for input(s): TROPIPOC in the last 168 hours.   BNPNo results for input(s): BNP, PROBNP in the last 168 hours.   DDimer No results for input(s): DDIMER in the last 168 hours.   Radiology    Dg Chest Portable 1 View  Result Date: 08/23/2017 CLINICAL DATA:  Shortness of Breath EXAM: PORTABLE CHEST 1 VIEW COMPARISON:  07/11/2015 FINDINGS: Cardiac shadow is enlarged. The lungs are well aerated bilaterally. No focal infiltrate or sizable effusion is seen. No acute bony abnormality is noted. IMPRESSION: No active disease. Electronically Signed   By: Inez Catalina M.D.   On: 08/23/2017 19:09    Cardiac Studies   none  Patient Profile     66  y.o. female admitted with atrial fib and RVR on sotalol. She was placed on IV amio and has reverted back to NSR.  Assessment & Plan    1. Atrial fib - she has gone back to NSR on IV amio. We will switch to oral amio. I would anticipate she can be discharged home tomorrow if she maintains nsr. 2. Atrial flutter - she is s/p ablation.  3. HTN - her blood pressure is controlled will follow. 4. Coags - she may require a reduction in her dose of warfarin while on amiodarone.  For questions or updates, please contact [place] Please consult [email or web address] for contact info under Cardiology/STEMI.      Signed, Cristopher Peru, MD  08/24/2017, 11:46 AM  Patient ID: Courtney Dunlap, female   DOB: 1951/05/17, 67 y.o.   MRN: [id number]

## 2017-08-24 NOTE — ED Provider Notes (Signed)
[facility] 4E CV SURGICAL PROGRESSIVE CARE Provider Note   CSN: [id number] Arrival date & time: 08/23/17  1838     History   Chief Complaint Chief Complaint  Patient presents with  . ventricular tachycardia    HPI Courtney Dunlap is a 67 y.o. female.  HPI Patient presented with a potential ventricular tachycardia.  Has had episodes of shortness of breath and feeling her heart race.  Previous history of A. fib or flutter with ablation.  Is on chronic Coumadin.  Has had a little bit of a cough for the last few days.  When the episodes come on her heart rate goes fast EMS had a wide-complex tachycardia and gave amiodarone for it.  Upon arrival she is having episodes of narrow complex tachycardia.  No swelling in her legs.  No fevers.  Slight chest tightness when the episodes happen. Past Medical History:  Diagnosis Date  . A-fib ([place])   . Allergic dermatitis   . Anxiety   . Arthritis    back, knees & ankles   . Breast cancer ([employer])   . Cancer ([place])    skin- lower abdomen  . Depression   . GERD (gastroesophageal reflux disease)   . Hiatal hernia    with GERD  . Hypercholesteremia   . Hypertension   . Hyperthyroidism    pt. reports that she thinks she has hypothyroidism, states MD- [place] took her off med. in the past 1-2 yrs.   . Mild cognitive impairment with memory loss   . Morbid obesity ([place])   . Sleep apnea    severe with AHI 37/hr now on CPAP at 13cm H2O  . SVT (supraventricular tachycardia) ([place])    likely due to atrial flutter.  successfullly ablated by Dr Lovena Le 10/17    Patient Active Problem List   Diagnosis Date Noted  . A-fib ([place]) 08/23/2017  . Atrial flutter ([place]) 05/25/2016  . SVT (supraventricular tachycardia) ([place]) 05/02/2016  . Morbid obesity ([place]) 07/24/2015  . Genetic testing 05/12/2015  . Family history of breast cancer   . Malignant neoplasm of upper-outer quadrant of right breast in female, estrogen receptor positive ([place]) 04/24/2015  . Paroxysmal  atrial fibrillation ([place]) 02/04/2014  . Benign essential HTN 02/04/2014  . OSA (obstructive sleep apnea) 02/04/2014  . Dyslipidemia 08/16/2011    Past Surgical History:  Procedure Laterality Date  . ABLATION  06/2016   Dr. Delilah Shan  . APPENDECTOMY  28 years ago  . DILATION AND CURETTAGE OF UTERUS  1996    Polyp resection   . ELECTROPHYSIOLOGIC STUDY N/A 05/27/2016   Procedure: EPS/SVT Ablation;  Surgeon: Evans Lance, MD;  Location: [facility];  Service: Cardiovascular;  Laterality: N/A;  . HYSTEROSCOPY  4/12   D&C  . RADIOACTIVE SEED GUIDED PARTIAL MASTECTOMY WITH AXILLARY SENTINEL LYMPH NODE BIOPSY Right 05/18/2015   Procedure: RIGHT BREAST RADIOACTIVE SEED PARTIAL MASTECTOMY WITH RIGHT SENTINEL LYMPH NODE MAPPING;  Surgeon: Erroll Luna, MD;  Location: [facility];  Service: General;  Laterality: Right;  . TONSILLECTOMY    . TUBAL LIGATION      OB History    Gravida Para Term Preterm AB Living   _0 SAB TAB Ectopic Multiple Live Births                   Home Medications    Prior to Admission medications   Medication Sig Start Date End Date Taking? Authorizing Provider  acetaminophen (TYLENOL) 500 MG tablet Take 500 mg by mouth every 6 (six) hours as needed for pain.   Yes [provider]  anastrozole (ARIMIDEX) 1 MG tablet Take 1 tablet (1 mg total) daily by mouth. 06/19/17  Yes Magrinat, Virgie Dad, MD  atorvastatin (LIPITOR) 20 MG tablet Take 20 mg by mouth daily after breakfast.  07/27/13  Yes [provider]  metoprolol tartrate (LOPRESSOR) 25 MG tablet Take 25 mg as directed by mouth. Take if palpations and heart rate are greater than 140 beats per minute. Can take up to three times daily   Yes [provider]  Omega-3 Fatty Acids (FISH OIL) 1000 MG CAPS Take 1,000 mg by mouth daily.   Yes [provider]  sotalol (BETAPACE) 80 MG tablet Take 80 mg by mouth 2 (two) times daily.   Yes [provider]  traMADol  (ULTRAM) 50 MG tablet Take 1 tablet (50 mg total) by mouth every 6 (six) hours as needed for moderate pain. 05/18/15  Yes Cornett, [name], MD  vitamin B-12 (CYANOCOBALAMIN) 1000 MCG tablet Take 1,000 mcg by mouth daily.   Yes [provider]  Vitamin D, Ergocalciferol, (DRISDOL) 50000 UNITS CAPS capsule Take 50,000 Units by mouth every 7 (seven) days. On Saturday 07/27/13  Yes [provider]  warfarin (COUMADIN) 5 MG tablet Take 1.5 tablets (7.5 mg total) by mouth daily. Resume on 05/30/2016 Patient taking differently: Take 5-7.5 mg by mouth See admin instructions. 55m M W F SAT and 7.541mT TH SUN 05/28/16  Yes SePatsey BertholdNP    Family History Family History  Problem Relation Age of Onset  . Hypertension Mother   . Brain cancer Mother   . Hypertension Sister   . Thyroid disease Sister   . Hypertension Sister   . Hypertension Brother   . Thyroid disease Brother   . Diabetes Maternal Grandmother   . Heart attack Maternal Grandmother   . Breast cancer Paternal Aunt        dx in her late [id number] 4067s. Melanoma Paternal Aunt   . Breast cancer Paternal Aunt   . Multiple myeloma Paternal Aunt   . Other Daughter [id number]     ITP    Social History Social History   Tobacco Use  . Smoking status: Former Smoker    Years: 4.00    Types: Cigarettes  . Smokeless tobacco: Former [employer] developer  Quit date: 05/15/1974  . Tobacco comment: quit 35-40 years ago  Substance Use Topics  . Alcohol use: No    Alcohol/week: 1.8 oz    Types: 3 Standard drinks or equivalent per week  . Drug use: No     Allergies   Codeine; Adhesive [tape]; and Latex   Review of Systems Review of Systems  Constitutional: Negative for appetite change.  HENT: Negative for congestion.   Respiratory: Positive for chest tightness and shortness of breath.   Cardiovascular: Positive for chest pain.  Gastrointestinal: Negative for abdominal pain.  Genitourinary: Negative for flank pain.    Musculoskeletal: Negative for gait problem.  Neurological: Negative for speech difficulty.  Hematological: Negative for adenopathy.  Psychiatric/Behavioral: Negative for confusion.     Physical Exam Updated Vital Signs BP 119/71 (BP Location: Left Arm)   Pulse 67   Temp 98.2 F (36.8 C) (Oral)   Resp 19   Ht _0  (1.575 m)   Wt 96.7 kg (213 lb 3.2 oz)   LMP 11/21/2010  Comment: spotting-had hysteroscopy/d&c  SpO2 93%   BMI 38.99 kg/m   Physical Exam  Constitutional: She appears well-developed.  HENT:  Head: Normocephalic.  Cardiovascular: Normal rate.  Patient has episodes of tachycardia along with normal rate.  Pulmonary/Chest: Effort normal.  Abdominal: Soft.  Musculoskeletal: She exhibits no edema or tenderness.  Neurological: She is alert.  Skin: Skin is warm. Capillary refill takes less than 2 seconds.  Psychiatric: She has a normal mood and affect.     ED Treatments / Results  Labs (all labs ordered are listed, but only abnormal results are displayed) Labs Reviewed  BASIC METABOLIC PANEL - Abnormal; Notable for the following components:      Result Value   CO2 21 (*)    Glucose, Bld 105 (*)    All other components within normal limits  CBC WITH DIFFERENTIAL/PLATELET - Abnormal; Notable for the following components:   WBC 12.0 (*)    Neutro Abs 8.8 (*)    Monocytes Absolute 1.3 (*)    All other components within normal limits  PROTIME-INR - Abnormal; Notable for the following components:   Prothrombin Time 24.8 (*)    All other components within normal limits  MAGNESIUM  TROPONIN I  BASIC METABOLIC PANEL  CBC  TROPONIN I  HEPATIC FUNCTION PANEL  TSH    EKG  EKG Interpretation None     ED ECG REPORT   Date: 08/24/2017  Rate: 188  Rhythm: tachycardia  QRS Axis: normal  Intervals: Narrow complex tachycardia Artifact in multiple leads.  Nonspecific ST and T wave changes.    Radiology Dg Chest Portable 1 View  Result Date:  08/23/2017 CLINICAL DATA:  Shortness of Breath EXAM: PORTABLE CHEST 1 VIEW COMPARISON:  07/11/2015 FINDINGS: Cardiac shadow is enlarged. The lungs are well aerated bilaterally. No focal infiltrate or sizable effusion is seen. No acute bony abnormality is noted. IMPRESSION: No active disease. Electronically Signed   By: Inez Catalina M.D.   On: 08/23/2017 19:09    Procedures Procedures (including critical care time)  Medications Ordered in ED Medications  amiodarone (NEXTERONE PREMIX) 360-4.14 MG/200ML-% (1.8 mg/mL) IV infusion (60 mg/hr Intravenous Transfusing/Transfer 08/23/17 2217)    Followed by  amiodarone (NEXTERONE PREMIX) 360-4.14 MG/200ML-% (1.8 mg/mL) IV infusion (not administered)  atorvastatin (LIPITOR) tablet 20 mg (not administered)  hydrochlorothiazide (MICROZIDE) capsule 12.5 mg (not administered)  sotalol (BETAPACE) tablet 80 mg (80 mg Oral Not Given 08/23/17 2343)  acetaminophen (TYLENOL) tablet 650 mg (not administered)  ondansetron (ZOFRAN) injection 4 mg (not administered)  metoprolol tartrate (LOPRESSOR) tablet 25 mg (25 mg Oral Given 08/23/17 2349)  Influenza vac split quadrivalent PF (FLUZONE HIGH-DOSE) injection 0.5 mL (not administered)  metoprolol tartrate (LOPRESSOR) injection 5 mg (5 mg Intravenous Given 08/23/17 1905)  metoprolol tartrate (LOPRESSOR) injection 5 mg (5 mg Intravenous Given 08/23/17 2054)     Initial Impression / Assessment and Plan / ED Course  I have reviewed the triage vital signs and the nursing notes.  Pertinent labs & imaging results that were available during my care of the patient were reviewed by me and considered in my medical decision making (see chart for details).    Patient with episodes of tachycardia.  Had one episode of wide-complex for EMS but is been narrow complex here.  Likely was A. fib flutter or SVT with aberrancy.  Seen in the ER by cardiology.  IV metoprolol and then  IV amiodarone given.  Admit to cardiology.  CRITICAL  CARE Performed by: Ovid Curd  Lorie Cleckley Total critical care time30 minutes Critical care time was exclusive of separately billable procedures and treating other patients. Critical care was necessary to treat or prevent imminent or life-threatening deterioration. Critical care was time spent personally by me on the following activities: development of treatment plan with patient and/or surrogate as well as nursing, discussions with consultants, evaluation of patient's response to treatment, examination of patient, obtaining history from patient or surrogate, ordering and performing treatments and interventions, ordering and review of laboratory studies, ordering and review of radiographic studies, pulse oximetry and re-evaluation of patient's condition.   Final Clinical Impressions(s) / ED Diagnoses   Final diagnoses:  None    ED Discharge Orders    None       Davonna Belling, MD 08/24/17 [phone]

## 2017-08-24 NOTE — Progress Notes (Addendum)
ANTICOAGULATION CONSULT NOTE - Initial Consult  Pharmacy Consult for warfarin Indication: atrial fibrillation    Allergies  Allergen Reactions  . Codeine Swelling    Facial/lips  . Adhesive [Tape] Itching and Rash    MUST USE PAPER TAPE-CAUSES BLISTERS AND BRUISES ALSO  . Latex Itching and Rash    Patient Measurements: Height: 5\' 2"  (157.5 cm) Weight: 213 lb 3.2 oz (96.7 kg) IBW/kg (Calculated) : 50.1   Vital Signs: Temp: 98.2 F (36.8 C) (01/12 2344) Temp Source: Oral (01/12 2344) BP: 119/71 (01/12 2344) Pulse Rate: 67 (01/12 2300)  Labs: Recent Labs    08/23/17 1850  HGB 13.8  HCT 41.3  PLT 262  LABPROT 24.8*  INR 2.26  CREATININE 0.56  TROPONINI <0.03    Estimated Creatinine Clearance: 75 mL/min (by C-G formula based on SCr of 0.56 mg/dL).   Medical History: Past Medical History:  Diagnosis Date  . A-fib ([place])   . Allergic dermatitis   . Anxiety   . Arthritis    back, knees & ankles   . Breast cancer ([place])   . Cancer ([place])    skin- lower abdomen  . Depression   . GERD (gastroesophageal reflux disease)   . Hiatal hernia    with GERD  . Hypercholesteremia   . Hypertension   . Hyperthyroidism    pt. reports that she thinks she has hypothyroidism, states MD- [place] took her off med. in the past 1-2 yrs.   . Mild cognitive impairment with memory loss   . Morbid obesity ([place])   . Sleep apnea    severe with AHI 37/hr now on CPAP at 13cm H2O  . SVT (supraventricular tachycardia) ([place])    likely due to atrial flutter.  successfullly ablated by Dr Lovena Le 10/17    Assessment: 67 yo female presents with arrhthmyia. She is on warfarin prior to admission for AFib. INR is therapeutic on admit.   PTA warfarin:  7.5 mg Tu/Th/Sun, 5 mg on all other days  Goal of Therapy:  INR 2-3 Monitor platelets by anticoagulation protocol: Yes   Plan:  -Continue home regimen -warfarin 5 mg po x1 -daily INR   Courtney Dunlap 08/24/2017,1:00  AM   ADDENDUM Last dose documented on 1/12 prior to admission. INR changed from 2.26 to 2.21 with morning labs. Agree with continuing home regimen. Will increase dose to 7.5 mg tonight to match home dose.   Monitor daily INR.   Courtney Dunlap, PharmD Clinical Pharmacist  Pager: [phone] Phone: [phone]

## 2017-08-25 DIAGNOSIS — I481 Persistent atrial fibrillation: Secondary | ICD-10-CM

## 2017-08-25 DIAGNOSIS — I471 Supraventricular tachycardia: Secondary | ICD-10-CM

## 2017-08-25 LAB — BASIC METABOLIC PANEL
ANION GAP: 9 (ref 5–15)
BUN: 10 mg/dL (ref 6–20)
CALCIUM: 9.3 mg/dL (ref 8.9–10.3)
CO2: 27 mmol/L (ref 22–32)
CREATININE: 0.72 mg/dL (ref 0.44–1.00)
Chloride: 100 mmol/L — ABNORMAL LOW (ref 101–111)
Glucose, Bld: 87 mg/dL (ref 65–99)
Potassium: 3.8 mmol/L (ref 3.5–5.1)
Sodium: 136 mmol/L (ref 135–145)

## 2017-08-25 LAB — PROTIME-INR
INR: 2.06
Prothrombin Time: 23 seconds — ABNORMAL HIGH (ref 11.4–15.2)

## 2017-08-25 LAB — MAGNESIUM: MAGNESIUM: 1.9 mg/dL (ref 1.7–2.4)

## 2017-08-25 MED ORDER — SOTALOL HCL 80 MG PO TABS
120.0000 mg | ORAL_TABLET | Freq: Two times a day (BID) | ORAL | Status: DC
  Administered 2017-08-25 – 2017-08-26 (×3): 120 mg via ORAL
  Filled 2017-08-25 (×3): qty 2

## 2017-08-25 MED ORDER — WARFARIN SODIUM 5 MG PO TABS
5.0000 mg | ORAL_TABLET | Freq: Once | ORAL | Status: AC
  Administered 2017-08-25: 5 mg via ORAL
  Filled 2017-08-25: qty 1

## 2017-08-25 NOTE — Progress Notes (Signed)
ANTICOAGULATION CONSULT NOTE - Follow Up Consult  Pharmacy Consult for warfarin Indication: atrial fibrillation    Allergies  Allergen Reactions  . Codeine Swelling    Facial/lips  . Adhesive [Tape] Itching and Rash    MUST USE PAPER TAPE-CAUSES BLISTERS AND BRUISES ALSO  . Latex Itching and Rash    Patient Measurements: Height: 5\' 2"  (157.5 cm) Weight: 213 lb 3.2 oz (96.7 kg) IBW/kg (Calculated) : 50.1   Vital Signs: Temp: 97.5 F (36.4 C) (01/14 0854) Temp Source: Oral (01/14 0854) BP: 124/63 (01/14 0854) Pulse Rate: 66 (01/14 0854)  Labs: Recent Labs    08/23/17 1850 08/24/17 0712 08/25/17 0457  HGB 13.8 13.6  --   HCT 41.3 40.5  --   PLT 262 242  --   LABPROT 24.8* 24.4* 23.0*  INR 2.26 2.21 2.06  CREATININE 0.56 0.60  --   TROPONINI <0.03 <0.03  --     Estimated Creatinine Clearance: 75 mL/min (by C-G formula based on SCr of 0.6 mg/dL).   Assessment: 66yof on warfarin pta for afib, admitted with narrow complex SVT and started on amiodarone. Amiodarone d/c'ed today and switched to an increased dose of sotalol. INR remains therapeutic at 2.06. No CBC today. No bleeding reported.  PTA dose:  7.5 mg Tu/Th/Sun, 5 mg on all other days  Goal of Therapy:  INR 2-3 Monitor platelets by anticoagulation protocol: Yes   Plan:  1) Warfarin 5mg  tonight per home regimen 2) Daily INR   Deboraha Sprang 08/25/2017,8:59 AM

## 2017-08-25 NOTE — Progress Notes (Signed)
Electrophysiology Rounding Note  Patient Name: Courtney Dunlap Date of Encounter: 08/25/2017  Primary Cardiologist: Radford Pax Electrophysiologist: Audrey Thull   Subjective   The patient is doing well today.  At this time, the patient denies chest pain, shortness of breath, or any new concerns.  Inpatient Medications    Scheduled Meds: . amiodarone  400 mg Oral BID  . atorvastatin  20 mg Oral QPC breakfast  . hydrochlorothiazide  12.5 mg Oral Daily  . Influenza vac split quadrivalent PF  0.5 mL Intramuscular Tomorrow-1000  . metoprolol tartrate  25 mg Oral Q6H  . Warfarin - Pharmacist Dosing Inpatient   Does not apply q1800   Continuous Infusions:  PRN Meds: acetaminophen, ondansetron (ZOFRAN) IV   Vital Signs    Vitals:   08/24/17 2043 08/24/17 2315 08/24/17 2320 08/25/17 0429  BP:   122/64   Pulse:  69    Resp:      Temp: 98.8 F (37.1 C)  98.2 F (36.8 C) 98.9 F (37.2 C)  TempSrc: Oral  Oral Oral  SpO2:      Weight:      Height:        Intake/Output Summary (Last 24 hours) at 08/25/2017 0831 Last data filed at 08/25/2017 0655 Gross per 24 hour  Intake 264 ml  Output 2600 ml  Net -2336 ml   Filed Weights   08/23/17 1857 08/23/17 2242  Weight: 200 lb (90.7 kg) 213 lb 3.2 oz (96.7 kg)    Physical Exam    GEN- The patient is obese appearing, alert and oriented x 3 today.   Head- normocephalic, atraumatic Eyes-  Sclera clear, conjunctiva pink Ears- hearing intact Oropharynx- clear Neck- supple Lungs- Clear to ausculation bilaterally, normal work of breathing Heart- Regular rate and rhythm  GI- soft, NT, ND, + BS Extremities- no clubbing, cyanosis, or edema Skin- no rash or lesion Psych- euthymic mood, full affect Neuro- strength and sensation are intact  Labs    CBC Recent Labs    08/23/17 1850 08/24/17 0712  WBC 12.0* 8.9  NEUTROABS 8.8*  --   HGB 13.8 13.6  HCT 41.3 40.5  MCV 93.2 93.8  PLT 262 161   Basic Metabolic Panel Recent Labs    08/23/17 1850 08/24/17 0712 08/25/17 0457  NA 136 139  --   K 3.9 3.8  --   CL 103 105  --   CO2 21* 23  --   GLUCOSE 105* 113*  --   BUN 10 8  --   CREATININE 0.56 0.60  --   CALCIUM 8.9 9.2  --   MG 2.1  --  1.9   Liver Function Tests Recent Labs    08/24/17 0712  AST 22  ALT 22  ALKPHOS 143*  BILITOT 0.7  PROT 7.0  ALBUMIN 3.5   No results for input(s): LIPASE, AMYLASE in the last 72 hours. Cardiac Enzymes Recent Labs    08/23/17 1850 08/24/17 0712  TROPONINI <0.03 <0.03   Thyroid Function Tests Recent Labs    08/24/17 0712  TSH 1.153    Telemetry    Sinus rhythm (personally reviewed)  Radiology    Dg Chest Portable 1 View  Result Date: 08/23/2017 CLINICAL DATA:  Shortness of Breath EXAM: PORTABLE CHEST 1 VIEW COMPARISON:  07/11/2015 FINDINGS: Cardiac shadow is enlarged. The lungs are well aerated bilaterally. No focal infiltrate or sizable effusion is seen. No acute bony abnormality is noted. IMPRESSION: No active disease. Electronically Signed  By: Inez Catalina M.D.   On: 08/23/2017 19:09     Patient Profile     Courtney Dunlap is a 67 y.o. female with a past medical history significant for atrial fibrillation, atrial flutter admitted for recurrent long-RP tachycardia. She has reverted to SR and feels improved.   Assessment & Plan    1.  Long-RP tachycardia Symptomatic with palpitations and pre-syncope Will stop amiodarone and increase Sotalol to 120mg  twice daily  Keep in hospital overnight tonight to monitor QTc Will need early outpatient follow up in clinic Keep K>3.9, Mg >1.8  2.  Persistent atrial fibrillation/atrial flutter S/p ablation Continue Warfarin long term for CHADS2VASC of 3 Lifestyle issues reviewed  3.  HTN Stable No change required today  4.  Obesity Body mass index is 38.99 kg/m. Weight loss encouraged  Anticipate discharge to home tomorrow if QTc stable tomorrow morning with early AF clinic follow  up  Signed, Chanetta Marshall, NP  08/25/2017, 8:31 AM   I have seen, examined the patient, and reviewed the above assessment and plan.  Changes to above are made where necessary.  On exam, RRR.  I had a long discussion with the patient and her spouse about options of sotalol, amiodarone, or ablation.  Risks and benefits of each approach were discussed at length.  The patient understands the risks and is clear that she would prefer to increase her sotalol dose rather than try a different AAD at this time. I will therefore increase sotalol to 120mg  BID and follow QT closely while here. Anticipate discharge tomorrow after 3rd dose if stable with close follow-up in the AF clinic.  Co Sign: Thompson Grayer, MD

## 2017-08-26 LAB — BASIC METABOLIC PANEL
Anion gap: 11 (ref 5–15)
BUN: 13 mg/dL (ref 6–20)
CHLORIDE: 101 mmol/L (ref 101–111)
CO2: 25 mmol/L (ref 22–32)
Calcium: 9.4 mg/dL (ref 8.9–10.3)
Creatinine, Ser: 0.59 mg/dL (ref 0.44–1.00)
GFR calc Af Amer: >60 mL/min (ref >60)
GFR calc non Af Amer: >60 mL/min (ref >60)
GLUCOSE: 103 mg/dL — AB (ref 65–99)
POTASSIUM: 3.7 mmol/L (ref 3.5–5.1)
Sodium: 137 mmol/L (ref 135–145)

## 2017-08-26 LAB — PROTIME-INR
INR: 2.01
Prothrombin Time: 22.6 seconds — ABNORMAL HIGH (ref 11.4–15.2)

## 2017-08-26 LAB — MAGNESIUM: Magnesium: 2 mg/dL (ref 1.7–2.4)

## 2017-08-26 MED ORDER — SOTALOL HCL 120 MG PO TABS: 120.0000 mg | ORAL_TABLET | Freq: Two times a day (BID) | ORAL | 3 refills | Status: DC

## 2017-08-26 NOTE — Discharge Instructions (Signed)

## 2017-08-26 NOTE — Discharge Summary (Signed)
ELECTROPHYSIOLOGY PROCEDURE DISCHARGE SUMMARY    Patient ID: Courtney Dunlap,  MRN: [id number], DOB/AGE: April 30, 1951 67 y.o.  Admit date: 08/23/2017 Discharge date: 08/26/2017  Primary Care Physician: Reynold Bowen, MD Primary Cardiologist: Radford Pax Electrophysiologist: Jantz Main  Primary Discharge Diagnosis:  1.  Symptomatic paroxysmal atrial fibrillation  Secondary Discharge Diagnoses: 1.  OSA on CPAP 2.  HTN 3.  Obesity 4.  Hyperlipidemia  Allergies  Allergen Reactions  . Codeine Swelling    Facial/lips  . Adhesive [Tape] Itching and Rash    MUST USE PAPER TAPE-CAUSES BLISTERS AND BRUISES ALSO  . Latex Itching and Rash    Brief HPI/Hospital Course:  Courtney Dunlap is a 67 y.o. female with the above past medical history. She has been fairly well controlled with Sotalol 80mg  twice daily. She was admitted with recurrent symptomatic AF and her Sotalol was increased to 120mg  twice daily. She was monitored on telemetry which demonstrated SR.  Labs were stable. QTc was stable. She was seen by Dr Rayann Heman and considered stable for home with early follow up in the AF clinic.   Physical Exam: Vitals:   08/25/17 2059 08/25/17 2330 08/26/17 0432 08/26/17 0925  BP: (!) 115/57 (!) 110/51 128/63 139/69  Pulse: 70 (!) 54 (!) 51 (!) 56  Resp: 18 19 15 14   Temp: 97.8 F (36.6 C) 97.9 F (36.6 C) 97.9 F (36.6 C)   TempSrc: Oral Oral Oral   SpO2: 95% 94% 94% 98%  Weight:   210 lb 11.2 oz (95.6 kg)   Height:        GEN- The patient is well appearing, alert and oriented x 3 today.   HEENT: normocephalic, atraumatic; sclera clear, conjunctiva pink; hearing intact; oropharynx clear; neck supple Lungs- Clear to ausculation bilaterally, normal work of breathing.  No wheezes, rales, rhonchi Heart- Regular rate and rhythm GI- soft, non-tender, non-distended, bowel sounds present Extremities- no clubbing, cyanosis, or edema; DP/PT/radial pulses 2+ bilaterally MS- no significant deformity  or atrophy Skin- warm and dry, no rash or lesion Psych- euthymic mood, full affect Neuro- strength and sensation are intact    Labs:   Lab Results  Component Value Date   WBC 8.9 08/24/2017   HGB 13.6 08/24/2017   HCT 40.5 08/24/2017   MCV 93.8 08/24/2017   PLT 242 08/24/2017    Recent Labs  Lab 08/24/17 0712  08/26/17 0356  NA 139   < > 137  K 3.8   < > 3.7  CL 105   < > 101  CO2 23   < > 25  BUN 8   < > 13  CREATININE 0.60   < > 0.59  CALCIUM 9.2   < > 9.4  PROT 7.0  --   --   BILITOT 0.7  --   --   ALKPHOS 143*  --   --   ALT 22  --   --   AST 22  --   --   GLUCOSE 113*   < > 103*   < > = values in this interval not displayed.     Discharge Medications:  Allergies as of 08/26/2017      Reactions   Codeine Swelling   Facial/lips   Adhesive [tape] Itching, Rash   MUST USE PAPER TAPE-CAUSES BLISTERS AND BRUISES ALSO   Latex Itching, Rash      Medication List    TAKE these medications   anastrozole 1 MG tablet Commonly known as:  ARIMIDEX Take 1 tablet (1 mg total) daily by mouth.   atorvastatin 20 MG tablet Commonly known as:  LIPITOR Take 20 mg by mouth daily after breakfast.   Fish Oil 1000 MG Caps Take 1,000 mg by mouth daily.   metoprolol tartrate 25 MG tablet Commonly known as:  LOPRESSOR Take 25 mg as directed by mouth. Take if palpations and heart rate are greater than 140 beats per minute. Can take up to three times daily   sotalol 120 MG tablet Commonly known as:  BETAPACE Take 1 tablet (120 mg total) by mouth every 12 (twelve) hours. What changed:    medication strength  how much to take  when to take this   traMADol 50 MG tablet Commonly known as:  ULTRAM Take 1 tablet (50 mg total) by mouth every 6 (six) hours as needed for moderate pain.   TYLENOL 500 MG tablet Generic drug:  acetaminophen Take 500 mg by mouth every 6 (six) hours as needed for pain.   vitamin B-12 1000 MCG tablet Commonly known as:  CYANOCOBALAMIN Take  1,000 mcg by mouth daily.   Vitamin D (Ergocalciferol) 50000 units Caps capsule Commonly known as:  DRISDOL Take 50,000 Units by mouth every 7 (seven) days. On Saturday   warfarin 5 MG tablet Commonly known as:  COUMADIN Take 1.5 tablets (7.5 mg total) by mouth daily. Resume on 05/30/2016 What changed:    how much to take  when to take this  additional instructions       Disposition: Pt is being discharged home today in good condition. Discharge Instructions    Diet - low sodium heart healthy   Complete by:  As directed    Increase activity slowly   Complete by:  As directed      Follow-up Information    [facility] Follow up on 08/29/2017.   Specialty:  Cardiology Why:  at 9:30AM Contact information: [address] [id number] [place] [address] [place], [place] Follow up in 1 week(s).   Specialty:  Endocrinology Why:  for coumadin check  Contact information: [address] [place] [address] [phone]           Duration of Discharge Encounter: Greater than 30 minutes including physician time.  Signed, Chanetta Marshall, NP 08/26/2017 12:21 PM  I have seen, examined the patient, and reviewed the above assessment and plan.  Changes to above are made where necessary.   On exam, RRR.  QT is stable with increased sotalol dose.  DC to home with close outpatient follow-up in the AF clinic.  Co Sign: Thompson Grayer, MD 08/26/2017 12:23 PM

## 2017-08-29 ENCOUNTER — Ambulatory Visit (HOSPITAL_COMMUNITY)
Admission: RE | Admit: 2017-08-29 | Discharge: 2017-08-29 | Disposition: A | Discharge status: Home / Self Care | Payer: Medicare Other | Source: Ambulatory Visit | Attending: Nurse Practitioner | Admitting: Nurse Practitioner

## 2017-08-29 ENCOUNTER — Encounter (HOSPITAL_COMMUNITY): Payer: Self-pay | Admitting: Nurse Practitioner

## 2017-08-29 VITALS — BP 110/58 | HR 60 | Ht 62.0 in | Wt 212.4 lb

## 2017-08-29 DIAGNOSIS — Z79899 Other long term (current) drug therapy: Secondary | ICD-10-CM | POA: Diagnosis not present

## 2017-08-29 DIAGNOSIS — I480 Paroxysmal atrial fibrillation: Secondary | ICD-10-CM | POA: Diagnosis not present

## 2017-08-29 DIAGNOSIS — Z87891 Personal history of nicotine dependence: Secondary | ICD-10-CM | POA: Insufficient documentation

## 2017-08-29 DIAGNOSIS — Z9889 Other specified postprocedural states: Secondary | ICD-10-CM | POA: Diagnosis not present

## 2017-08-29 DIAGNOSIS — Z90710 Acquired absence of both cervix and uterus: Secondary | ICD-10-CM | POA: Insufficient documentation

## 2017-08-29 DIAGNOSIS — I10 Essential (primary) hypertension: Secondary | ICD-10-CM | POA: Insufficient documentation

## 2017-08-29 DIAGNOSIS — Z7901 Long term (current) use of anticoagulants: Secondary | ICD-10-CM | POA: Diagnosis not present

## 2017-08-29 DIAGNOSIS — Z9851 Tubal ligation status: Secondary | ICD-10-CM | POA: Diagnosis not present

## 2017-08-29 NOTE — Progress Notes (Signed)
Primary Care Physician: Reynold Bowen, MD Referring Physician: Dr.Allred    Courtney Dunlap is a 67 y.o. female with a h/o afib controlled on 80 mg bid. She has increase in afib and was recently hospitalized for increase in sotalol dose. Dose was increased to 120 mg bid.  Today, she denies symptoms of palpitations, chest pain, shortness of breath, orthopnea, PND, lower extremity edema, dizziness, presyncope, syncope, or neurologic sequela. The patient is tolerating medications without difficulties and is otherwise without complaint today.   Past Medical History:  Diagnosis Date  . A-fib ([place])   . Allergic dermatitis   . Anxiety   . Arthritis    back, knees & ankles   . Breast cancer ([place])   . Cancer ([place])    skin- lower abdomen  . Depression   . GERD (gastroesophageal reflux disease)   . Hiatal hernia    with GERD  . Hypercholesteremia   . Hypertension   . Hyperthyroidism    pt. reports that she thinks she has hypothyroidism, states MD- [place] took her off med. in the past 1-2 yrs.   . Mild cognitive impairment with memory loss   . Morbid obesity ([place])   . Sleep apnea    severe with AHI 37/hr now on CPAP at 13cm H2O  . SVT (supraventricular tachycardia) ([place])    likely due to atrial flutter.  successfullly ablated by Dr Lovena Le 10/17   Past Surgical History:  Procedure Laterality Date  . ABLATION  06/2016   Dr. Delilah Shan  . APPENDECTOMY  28 years ago  . DILATION AND CURETTAGE OF UTERUS  1996    Polyp resection   . ELECTROPHYSIOLOGIC STUDY N/A 05/27/2016   Procedure: EPS/SVT Ablation;  Surgeon: Evans Lance, MD;  Location: [place] [facility];  Service: Cardiovascular;  Laterality: N/A;  . HYSTEROSCOPY  4/12   D&C  . RADIOACTIVE SEED GUIDED PARTIAL MASTECTOMY WITH AXILLARY SENTINEL LYMPH NODE BIOPSY Right 05/18/2015   Procedure: RIGHT BREAST RADIOACTIVE SEED PARTIAL MASTECTOMY WITH RIGHT SENTINEL LYMPH NODE MAPPING;  Surgeon: Erroll Luna, MD;  Location: [facility];   Service: General;  Laterality: Right;  . TONSILLECTOMY    . TUBAL LIGATION      Current Outpatient Medications  Medication Sig Dispense Refill  . acetaminophen (TYLENOL) 500 MG tablet Take 500 mg by mouth every 6 (six) hours as needed for pain.    [name] anastrozole (ARIMIDEX) 1 MG tablet Take 1 tablet (1 mg total) daily by mouth. 90 tablet 4  . atorvastatin (LIPITOR) 20 MG tablet Take 20 mg by mouth daily after breakfast.     . metoprolol tartrate (LOPRESSOR) 25 MG tablet Take 25 mg as directed by mouth. Take if palpations and heart rate are greater than 140 beats per minute. Can take up to three times daily    . Omega-3 Fatty Acids (FISH OIL) 1000 MG CAPS Take 1,000 mg by mouth daily.    . sotalol (BETAPACE) 120 MG tablet Take 1 tablet (120 mg total) by mouth every 12 (twelve) hours. 180 tablet 3  . traMADol (ULTRAM) 50 MG tablet Take 1 tablet (50 mg total) by mouth every 6 (six) hours as needed for moderate pain. 30 tablet 0  . vitamin B-12 (CYANOCOBALAMIN) 1000 MCG tablet Take 1,000 mcg by mouth daily.    . Vitamin D, Ergocalciferol, (DRISDOL) 50000 UNITS CAPS capsule Take 50,000 Units by mouth every 7 (seven) days. On Saturday    . warfarin (COUMADIN) 5 MG tablet Take 1.5  tablets (7.5 mg total) by mouth daily. Resume on 05/30/2016 (Patient taking differently: Take 5-7.5 mg by mouth See admin instructions. 67m M W F SAT and 7.549mT TH SUN)     No current facility-administered medications for this encounter.     Allergies  Allergen Reactions  . Codeine Swelling    Facial/lips  . Adhesive [Tape] Itching and Rash    MUST USE PAPER TAPE-CAUSES BLISTERS AND BRUISES ALSO  . Latex Itching and Rash    Social History   Socioeconomic History  . Marital status: Married    Spouse name: Not on file  . Number of children: 2  . Years of education: Not on file  . Highest education level: Not on file  Social Needs  . Financial resource strain: Not on file  . Food insecurity - worry: Not on file   . Food insecurity - inability: Not on file  . Transportation needs - medical: Not on file  . Transportation needs - non-medical: Not on file  Occupational History    Employer: [employer]  . Smoking status: Former Smoker    Years: 4.00    Types: Cigarettes  . Smokeless tobacco: Former [employer] developer  Quit date: 05/15/1974  . Tobacco comment: quit 35-40 years ago  Substance and Sexual Activity  . Alcohol use: No    Alcohol/week: 1.8 oz    Types: 3 Standard drinks or equivalent per week  . Drug use: No  . Sexual activity: Not Currently    Birth control/protection: Post-menopausal  Other Topics Concern  . Not on file  Social History Narrative  . Not on file    Family History  Problem Relation Age of Onset  . Hypertension Mother   . Brain cancer Mother   . Hypertension Sister   . Thyroid disease Sister   . Hypertension Sister   . Hypertension Brother   . Thyroid disease Brother   . Diabetes Maternal Grandmother   . Heart attack Maternal Grandmother   . Breast cancer Paternal Aunt        dx in her late [id number]. Melanoma Paternal Aunt   . Breast cancer Paternal Aunt   . Multiple myeloma Paternal Aunt   . Other Daughter [id number]     ITP    ROS- All systems are reviewed and negative except as per the HPI above  Physical Exam: Vitals:   08/29/17 0911  BP: (!) 110/58  Pulse: 60  Weight: 212 lb 6.4 oz (96.3 kg)  Height: '5\' 2"'  (1.575 m)   Wt Readings from Last 3 Encounters:  08/29/17 212 lb 6.4 oz (96.3 kg)  08/26/17 210 lb 11.2 oz (95.6 kg)  06/18/17 209 lb (94.8 kg)    Labs: Lab Results  Component Value Date   NA 137 08/26/2017   K 3.7 08/26/2017   CL 101 08/26/2017   CO2 25 08/26/2017   GLUCOSE 103 (H) 08/26/2017   BUN 13 08/26/2017   CREATININE 0.59 08/26/2017   CALCIUM 9.4 08/26/2017   MG 2.0 08/26/2017   Lab Results  Component Value Date   INR 2.01 08/26/2017   No results found for: CHOL, HDL, LDLCALC, TRIG   GEN- The  patient is well appearing, alert and oriented x 3 today.   Head- normocephalic, atraumatic Eyes-  Sclera clear, conjunctiva pink Ears- hearing intact Oropharynx- clear Neck- supple, no JVP Lymph- no cervical lymphadenopathy Lungs- Clear to ausculation bilaterally, normal work of  breathing Heart- Regular rate and rhythm, no murmurs, rubs or gallops, PMI not laterally displaced GI- soft, NT, ND, + BS Extremities- no clubbing, cyanosis, or edema MS- no significant deformity or atrophy Skin- no rash or lesion Psych- euthymic mood, full affect Neuro- strength and sensation are intact  EKG- NSR at 60 bpm, qrs int 90 ms, qtc 422 ms Epic records reviewed    Assessment and Plan: 1. Paroxysmal atrial fibrillation  No afib noted since increase in sotalol to 120 mg bid Feels slightly lightheaded since increase in dose, will continue to watch and report if does not improve Continue warfarin for chadsvasc score of at least 3  F/u with Dr. Rayann Heman in 3 months  Geroge Baseman. Karion Cudd, [facility] [address] [place], [place] [address] [phone]

## 2017-09-09 NOTE — Addendum Note (Signed)
Encounter addended by: Sherran Needs, NP on: 09/09/2017 8:45 AM  Actions taken: LOS modified

## 2017-10-17 ENCOUNTER — Telehealth: Payer: Self-pay | Admitting: *Deleted

## 2017-10-17 NOTE — Telephone Encounter (Signed)
Patient called and requested a refill on hctz. This medication is not listed on her current med list, patient stated that she is not sure why. She would like to know if Dr Radford Pax wants her to continue taking this. Patient can be reached at [phone]. Thanks, MI

## 2017-10-17 NOTE — Telephone Encounter (Signed)
Returned call to patient. Patient wanted to confirm if she should be taking HCTZ. Patient denied all symptoms. Informed patient that per discharge summary on 08/23/17, HCTZ is not listed. Informed patient to continue taking meds per discharge summary and call the office if she develops any new symptoms. Patient verbalized understanding and thankful for the call.

## 2017-12-01 ENCOUNTER — Telehealth: Payer: Self-pay | Admitting: Cardiology

## 2017-12-01 DIAGNOSIS — Z79899 Other long term (current) drug therapy: Secondary | ICD-10-CM

## 2017-12-01 MED ORDER — HYDROCHLOROTHIAZIDE 25 MG PO TABS: 25.0000 mg | ORAL_TABLET | Freq: Every day | ORAL | 3 refills | Status: DC

## 2017-12-01 NOTE — Telephone Encounter (Signed)
I spoke with patient. This morning when she woke up at 5:45AM BP was 168/101, she rechecked BP at 8:45 BP 163/84. Patient states she felt like she was going to pass out this morning and has a slight HA. Patient states she was taking HCTZ 12.5 mg once a day and stop taking around 01/2017 because she did not have additional refills. I informed patient I would forward to Dr. Radford Pax for further recommendations. She verbalized understanding and thankful for the call.

## 2017-12-01 NOTE — Telephone Encounter (Signed)
I informed patient of Dr. Theodosia Blender recommendation to START HCTZ 25 mg daily. Monitor BP for the next week and repeat BMET in a week. Patient in agreement with plan, pt scheduled for labs on 12/08/17. Patient thankful for the call.

## 2017-12-01 NOTE — Telephone Encounter (Signed)
Restart HCTZ 25mg  daily and check BMET in 1 week.  Please find out if she is still feeling bad

## 2017-12-01 NOTE — Telephone Encounter (Signed)
New message   Pt c/o BP issue: STAT if pt c/o blurred vision, one-sided weakness or slurred speech  1. What are your last 5 BP readings? 157/95, 163/84, 135/95, 160/96, 148/93  2. Are you having any other symptoms (ex. Dizziness, headache, blurred vision, passed out)? Feels "foggy", dizziness  3. What is your BP issue? Feels BP too high, doesn't feel like herself

## 2017-12-08 ENCOUNTER — Other Ambulatory Visit: Payer: Medicare Other | Admitting: *Deleted

## 2017-12-08 DIAGNOSIS — Z79899 Other long term (current) drug therapy: Secondary | ICD-10-CM

## 2017-12-09 LAB — BASIC METABOLIC PANEL
BUN / CREAT RATIO: 14 (ref 12–28)
BUN: 11 mg/dL (ref 8–27)
CO2: 27 mmol/L (ref 20–29)
Calcium: 9.9 mg/dL (ref 8.7–10.3)
Chloride: 97 mmol/L (ref 96–106)
Creatinine, Ser: 0.79 mg/dL (ref 0.57–1.00)
GFR calc Af Amer: 90 mL/min/{1.73_m2} (ref >59)
GFR calc non Af Amer: 78 mL/min/{1.73_m2} (ref >59)
GLUCOSE: 92 mg/dL (ref 65–99)
POTASSIUM: 3.9 mmol/L (ref 3.5–5.2)
SODIUM: 140 mmol/L (ref 134–144)

## 2017-12-09 NOTE — Telephone Encounter (Signed)
Patient called today stating she has received her medicare insurance and was ready to get her CPAP machine. I reached out to [facility] Booty) to send all paperwork needed and sharon states per medicare guidelines the office visit and sleep study can not be over 71 months old or the whole process has be restarted. I reached out to the patient to inform her and lmtcb.

## 2017-12-09 NOTE — Telephone Encounter (Signed)
Patient called back to say she has [employer] but there was not a card on file for [facility]. The only card on file was for medicare.Patient will bring her new card to the office on Wednesday 5/1 to be scanned and at that time she can choose a home health agency. Pt is aware and agreeable to treatment.

## 2017-12-11 NOTE — Telephone Encounter (Signed)
Patient's insurance card scanned and on file. Patient has an office visit scheduled for 02/04/18 at 8:20. Pt is aware and agreeable to treatment.

## 2017-12-23 ENCOUNTER — Other Ambulatory Visit: Payer: Self-pay | Admitting: *Deleted

## 2017-12-23 MED ORDER — ANASTROZOLE 1 MG PO TABS: 1.0000 mg | ORAL_TABLET | Freq: Every day | ORAL | 4 refills | Status: DC

## 2018-02-03 ENCOUNTER — Telehealth: Payer: Self-pay | Admitting: *Deleted

## 2018-02-03 NOTE — Telephone Encounter (Addendum)
Reached out to [facility] to get a download on patient for her office visit on Wednesday 02/04/18 and was informed by Yvetta Coder that the patient had refused to get set up with her cpap on 09/30/17 no explanation was given. I reached out to the patient and [facility]

## 2018-02-04 ENCOUNTER — Encounter (INDEPENDENT_AMBULATORY_CARE_PROVIDER_SITE_OTHER): Payer: Self-pay

## 2018-02-04 ENCOUNTER — Telehealth: Payer: Self-pay | Admitting: *Deleted

## 2018-02-04 ENCOUNTER — Encounter: Payer: Self-pay | Admitting: Cardiology

## 2018-02-04 ENCOUNTER — Ambulatory Visit: Payer: Medicare Other | Admitting: Cardiology

## 2018-02-04 VITALS — BP 120/64 | HR 58 | Ht 62.0 in | Wt 214.4 lb

## 2018-02-04 DIAGNOSIS — I10 Essential (primary) hypertension: Secondary | ICD-10-CM | POA: Diagnosis not present

## 2018-02-04 DIAGNOSIS — G4733 Obstructive sleep apnea (adult) (pediatric): Secondary | ICD-10-CM

## 2018-02-04 DIAGNOSIS — R002 Palpitations: Secondary | ICD-10-CM

## 2018-02-04 DIAGNOSIS — I480 Paroxysmal atrial fibrillation: Secondary | ICD-10-CM | POA: Diagnosis not present

## 2018-02-04 DIAGNOSIS — I471 Supraventricular tachycardia, unspecified: Secondary | ICD-10-CM

## 2018-02-04 NOTE — Telephone Encounter (Signed)
-----   Message from Teressa Senter, RN sent at 02/04/2018  8:56 AM EDT ----- Regarding: dme order  DME order placed and Dr. Radford Pax would like pt set up with [facility]   Thanks  [facility]

## 2018-02-04 NOTE — Telephone Encounter (Signed)
Airesense CPAP auto-titration from 4-18 cm water pressure with supplies and Resmed Airfit P30 mask with chin strap.  Order faxed to [facility]

## 2018-02-04 NOTE — Progress Notes (Signed)
Cardiology Office Note:    Date:  02/04/2018   ID:  [name], DOB [birth year], MRN [id number]  PCP:  Reynold Bowen, MD  Cardiologist:  Fransico Him, MD    Referring MD: Reynold Bowen, MD   Chief Complaint  Patient presents with  . Sleep Apnea  . Hypertension  . Atrial Fibrillation    History of Present Illness:    Courtney Dunlap is a 67 y.o. female with a hx of PAF in the setting of hyperthyroidism, SVT s/p ablation, HTN and OSA on CPAP.  She is here today for followup and is doing well.  She denies any chest pain or pressure, SOB, DOE, PND, orthopnea, LE edema, dizziness,  or syncope.  She has not had any further palpitations but has been complaining of intermittent sinking spells in her chest where she gets an empty sensation in her chest.  She has not had any dizziness or syncope associated with these.  She is compliant with her meds and is tolerating meds with no SE.  She is doing well with her CPAP device.  She tolerates the mask and feels the pressure is adequate.  Since going on CPAP she feels rested in the am and has no significant daytime sleepiness.  She denies any significant mouth or nasal dryness or nasal congestion.  She does not think that he snores.      Past Medical History:  Diagnosis Date  . A-fib ([place])   . Allergic dermatitis   . Anxiety   . Arthritis    back, knees & ankles   . Atrial flutter ([place]) 05/25/2016  . Benign essential HTN 02/04/2014  . Breast cancer ([place])   . Cancer ([place])    skin- lower abdomen  . Depression   . Dyslipidemia 08/16/2011  . Family history of breast cancer   . Genetic testing 05/12/2015   Negative genetic testing on the Breast/Ovarian cancer panel.  The Breast/Ovarian gene panel offered by [facility] includes sequencing and rearrangement analysis for the following 20 genes:  ATM, BARD1, BRCA1, BRCA2, BRIP1, CDH1, CHEK2, EPCAM, FANCC, MLH1, MSH2, MSH6, NBN, PALB2, PMS2, PTEN, RAD51C, RAD51D, TP53, and XRCC2.   The report date is  May 11, 2015.   [name] GERD (gastroesophageal reflux disease)   . Hiatal hernia    with GERD  . Hypercholesteremia   . Hypertension   . Hyperthyroidism    pt. reports that she thinks she has hypothyroidism, states MD- [place] took her off med. in the past 1-2 yrs.   . Malignant neoplasm of upper-outer quadrant of right breast in female, estrogen receptor positive ([place]) 04/24/2015  . Mild cognitive impairment with memory loss   . Morbid obesity ([place])   . OSA (obstructive sleep apnea) 02/04/2014  . Paroxysmal atrial fibrillation ([place]) 02/04/2014  . Sleep apnea    severe with AHI 37/hr now on CPAP at 13cm H2O  . SVT (supraventricular tachycardia) ([place])    likely due to atrial flutter.  successfullly ablated by Dr Lovena Le 10/17    Past Surgical History:  Procedure Laterality Date  . ABLATION  06/2016   Dr. Delilah Shan  . APPENDECTOMY  28 years ago  . DILATION AND CURETTAGE OF UTERUS  1996    Polyp resection   . ELECTROPHYSIOLOGIC STUDY N/A 05/27/2016   Procedure: EPS/SVT Ablation;  Surgeon: Evans Lance, MD;  Location: [place] [facility];  Service: Cardiovascular;  Laterality: N/A;  . HYSTEROSCOPY  4/12   D&C  . RADIOACTIVE SEED GUIDED PARTIAL  MASTECTOMY WITH AXILLARY SENTINEL LYMPH NODE BIOPSY Right 05/18/2015   Procedure: RIGHT BREAST RADIOACTIVE SEED PARTIAL MASTECTOMY WITH RIGHT SENTINEL LYMPH NODE MAPPING;  Surgeon: Erroll Luna, MD;  Location: [facility];  Service: General;  Laterality: Right;  . TONSILLECTOMY    . TUBAL LIGATION      Current Medications: Current Meds  Medication Sig  . acetaminophen (TYLENOL) 500 MG tablet Take 500 mg by mouth every 6 (six) hours as needed for pain.  [name] anastrozole (ARIMIDEX) 1 MG tablet Take 1 tablet (1 mg total) by mouth daily.  [name] atorvastatin (LIPITOR) 20 MG tablet Take 20 mg by mouth daily after breakfast.   . hydrochlorothiazide (HYDRODIURIL) 25 MG tablet Take 1 tablet (25 mg total) by mouth daily.  . metoprolol tartrate (LOPRESSOR) 25 MG  tablet Take 25 mg as directed by mouth. Take if palpations and heart rate are greater than 140 beats per minute. Can take up to three times daily  . Omega-3 Fatty Acids (FISH OIL) 1000 MG CAPS Take 1,000 mg by mouth daily.  . sotalol (BETAPACE) 120 MG tablet Take 1 tablet (120 mg total) by mouth every 12 (twelve) hours.  . traMADol (ULTRAM) 50 MG tablet Take 1 tablet (50 mg total) by mouth every 6 (six) hours as needed for moderate pain.  . vitamin B-12 (CYANOCOBALAMIN) 1000 MCG tablet Take 1,000 mcg by mouth daily.  . Vitamin D, Ergocalciferol, (DRISDOL) 50000 UNITS CAPS capsule Take 50,000 Units by mouth every 7 (seven) days. On Saturday  . warfarin (COUMADIN) 5 MG tablet Take 1.5 tablets (7.5 mg total) by mouth daily. Resume on 05/30/2016 (Patient taking differently: Take 5-7.5 mg by mouth See admin instructions. 73m M W F SAT and 7.545mT TH SUN)     Allergies:   Codeine; Adhesive [tape]; and Latex   Social History   Socioeconomic History  . Marital status: Married    Spouse name: Not on file  . Number of children: 2  . Years of education: Not on file  . Highest education level: Not on file  Occupational History    [employer]er: [employer]  . Financial resource strain: Not on file  . Food insecurity:    Worry: Not on file    Inability: Not on file  . Transportation needs:    Medical: Not on file    Non-medical: Not on file  Tobacco Use  . Smoking status: Former Smoker    Years: 4.00    Types: Cigarettes  . Smokeless tobacco: Former [employer] developer  Quit date: 05/15/1974  . Tobacco comment: quit 35-40 years ago  Substance and Sexual Activity  . Alcohol use: No    Alcohol/week: 1.8 oz    Types: 3 Standard drinks or equivalent per week  . Drug use: No  . Sexual activity: Not Currently    Birth control/protection: Post-menopausal  Lifestyle  . Physical activity:    Days per week: Not on file    Minutes per session: Not on file  . Stress: Not on file    Relationships  . Social connections:    Talks on phone: Not on file    Gets together: Not on file    Attends religious service: Not on file    Active member of club or organization: Not on file    Attends meetings of clubs or organizations: Not on file    Relationship status: Not on file  Other Topics Concern  . Not on file  Social  History Narrative  . Not on file     Family History: The patient's family history includes Brain cancer in her mother; Breast cancer in her paternal aunt and paternal aunt; Diabetes in her maternal grandmother; Heart attack in her maternal grandmother; Hypertension in her brother, mother, sister, and sister; Melanoma in her paternal aunt; Multiple myeloma in her paternal aunt; Other (age of onset: 7) in her daughter; Thyroid disease in her brother and sister.  ROS:   Please see the history of present illness.    ROS  All other systems reviewed and negative.   EKGs/Labs/Other Studies Reviewed:    The following studies were reviewed today: PAP download  EKG:  EKG is not ordered today.    Recent Labs: 08/24/2017: ALT 22; Hemoglobin 13.6; Platelets 242; TSH 1.153 08/26/2017: Magnesium 2.0 12/08/2017: BUN 11; Creatinine, Ser 0.79; Potassium 3.9; Sodium 140   Recent Lipid Panel No results found for: CHOL, TRIG, HDL, CHOLHDL, VLDL, LDLCALC, LDLDIRECT  Physical Exam:    VS:  BP 120/64   Pulse (!) 58   Ht '5\' 2"'  (1.575 m)   Wt 214 lb 6.4 oz (97.3 kg)   LMP 11/21/2010 Comment: spotting-had hysteroscopy/d&c  SpO2 95%   BMI 39.21 kg/m     Wt Readings from Last 3 Encounters:  02/04/18 214 lb 6.4 oz (97.3 kg)  08/29/17 212 lb 6.4 oz (96.3 kg)  08/26/17 210 lb 11.2 oz (95.6 kg)     GEN:  Well nourished, well developed in no acute distress HEENT: Normal NECK: No JVD; No carotid bruits LYMPHATICS: No lymphadenopathy CARDIAC: RRR, no murmurs, rubs, gallops RESPIRATORY:  Clear to auscultation without rales, wheezing or rhonchi  ABDOMEN: Soft,  non-tender, non-distended MUSCULOSKELETAL:  No edema; No deformity  SKIN: Warm and dry NEUROLOGIC:  Alert and oriented x 3 PSYCHIATRIC:  Normal affect   ASSESSMENT:    1. Paroxysmal atrial fibrillation (HCC)   2. Benign essential HTN   3. SVT (supraventricular tachycardia) ([place])   4. OSA (obstructive sleep apnea)    PLAN:    In order of problems listed above:  1.  Paroxysmal atrial fibrillation - she is followed in atrial fibrillation clinic and A. fib and  had to have her sotalol dose increased to 120 mg twice daily.  She was seen in A. fib clinic in January and had not had any further atrial fibrillation.  She is on warfarin for chads2vascore of 3.  She is has not had any problems with recurrent A. fib but occasionally will get a really bad sinking feeling in her chest.  Therefore I will order an event monitor to make sure she is not having any bradycardia arrhythmias on the higher dose of sotalol.  2.  Hypertension - she is well controlled on exam today.  She will continue on HCTZ 25 mg daily   3.  SVT -she denies any recent palpitations.  She uses metoprolol as needed for palpitations.  4.  OSA - the patient is tolerating PAP therapy well without any problems.   The patient has been using and benefiting from PAP use and will continue to benefit from therapy.  Her device is more than 67 years old so I will order her a new CPAP device in place her on auto from 4 to 18 cm's H2O.  I will set her up with an Airsense CPAP on auto setting from 4 to 18 cm's H2O.  I will also order a ResMed air fit P 30 mask with chinstrap  at her request.     Medication Adjustments/Labs and Tests Ordered: Current medicines are reviewed at length with the patient today.  Concerns regarding medicines are outlined above.  No orders of the defined types were placed in this encounter.  No orders of the defined types were placed in this encounter.   Signed, Fransico Him, MD  02/04/2018 8:38 AM    [facility]

## 2018-02-04 NOTE — Patient Instructions (Signed)
Medication Instructions:  Your physician recommends that you continue on your current medications as directed. Please refer to the Current Medication list given to you today.  If you need a refill on your cardiac medications, please contact your pharmacy first.  Labwork: None ordered   Testing/Procedures: Your physician has recommended that you wear an event monitor. Event monitors are medical devices that record the heart's electrical activity. Doctors most often [place] these monitors to diagnose arrhythmias. Arrhythmias are problems with the speed or rhythm of the heartbeat. The monitor is a small, portable device. You can wear one while you do your normal daily activities. This is usually used to diagnose what is causing palpitations/syncope (passing out).  Follow-Up: Your physician recommends that you schedule a follow-up appointment in: 10 weeks with Dr. Radford Pax   Any Other Special Instructions Will Be Listed Below (If Applicable). You physician has ordered you a Airsense CPAP auto setting 4-18 cm water pressure with supplies   Thank you for choosing [facility], RN  [phone]  If you need a refill on your cardiac medications before your next appointment, please call your pharmacy.

## 2018-02-10 ENCOUNTER — Telehealth: Payer: Self-pay | Admitting: Obstetrics & Gynecology

## 2018-02-10 NOTE — Telephone Encounter (Signed)
Return call to patient. Complains of RLQ pain for 6 weeks. Discussed with PCP who recommended GYN evaluation. Denies pain or burning with urination, denies fever or vaginal bleeding. Has some back discomfort but feels related to strenuous activity.  Office visit scheduled with Dr Sabra Heck for 02-16-18. Declined earlier appointment with another provider.  Precautions given to call back if pain increases. Advised can try ibuprofen 800 mg  Q 8 hours with food.   Routing to provider for final review. Will close encounter.

## 2018-02-10 NOTE — Telephone Encounter (Signed)
Patient is having pain and discomfort on her right side.

## 2018-02-11 ENCOUNTER — Ambulatory Visit (INDEPENDENT_AMBULATORY_CARE_PROVIDER_SITE_OTHER): Payer: Medicare Other

## 2018-02-11 DIAGNOSIS — R002 Palpitations: Secondary | ICD-10-CM

## 2018-02-11 NOTE — Telephone Encounter (Signed)
Patient has a 10 week follow up appointment scheduled for .9/23/ 2019 at 9 am.. Patient understands she needs to keep this appointment for insurance compliance. Patient was grateful for the call and thanked me.

## 2018-02-16 ENCOUNTER — Encounter: Payer: Self-pay | Admitting: Obstetrics & Gynecology

## 2018-02-16 ENCOUNTER — Other Ambulatory Visit: Payer: Self-pay

## 2018-02-16 ENCOUNTER — Ambulatory Visit: Payer: Medicare Other | Admitting: Obstetrics & Gynecology

## 2018-02-16 VITALS — BP 124/64 | HR 68 | Resp 14 | Ht 62.0 in | Wt 214.8 lb

## 2018-02-16 DIAGNOSIS — R1031 Right lower quadrant pain: Secondary | ICD-10-CM | POA: Diagnosis not present

## 2018-02-16 DIAGNOSIS — R102 Pelvic and perineal pain: Secondary | ICD-10-CM

## 2018-02-16 DIAGNOSIS — Z124 Encounter for screening for malignant neoplasm of cervix: Secondary | ICD-10-CM

## 2018-02-16 NOTE — Progress Notes (Signed)
GYNECOLOGY  VISIT  CC:   RLQ pain  HPI: 67 y.o. G67P2002 Married Caucasian female here for R side abdomen/pelvic pain x 6 weeks.  At times she feels this just "gas" but other times it feels like more.  She feels "waves of discomfort" at times.  Will start in the midline at times and move to the right.    Bowel movements are regular.  She eats oatmeal every morning with blueberries, applesauce and raisins in it.  She has a bowel movement every day.    Cologuard was negative 8/18.    She has gained about 12 pounds since I saw her last year.  Retired last year.  Eating a lot more vegetables this summer due to her garden.    Denies vaginal bleeding.    Denies nausea.  Denies dysuria or urgency.  Has noticed more lower back pain but she attributes this to picking beans and working in her garden.    GYNECOLOGIC HISTORY: Patient's last menstrual period was 11/21/2010. Contraception: post menopausal  Menopausal hormone therapy: none  Patient Active Problem List   Diagnosis Date Noted  . SVT (supraventricular tachycardia) ([place]) 05/02/2016  . Morbid obesity ([place]) 07/24/2015  . Genetic testing 05/12/2015  . Family history of breast cancer   . Malignant neoplasm of upper-outer quadrant of right breast in female, estrogen receptor positive ([place]) 04/24/2015  . Paroxysmal atrial fibrillation ([place]) 02/04/2014  . Benign essential HTN 02/04/2014  . OSA (obstructive sleep apnea) 02/04/2014  . Dyslipidemia 08/16/2011    Past Medical History:  Diagnosis Date  . A-fib ([place])   . Allergic dermatitis   . Anxiety   . Arthritis    back, knees & ankles   . Atrial flutter ([place]) 05/25/2016  . Benign essential HTN 02/04/2014  . Breast cancer ([place])   . Cancer ([place])    skin- lower abdomen  . Depression   . Dyslipidemia 08/16/2011  . Family history of breast cancer   . Genetic testing 05/12/2015   Negative genetic testing on the Breast/Ovarian cancer panel.  The Breast/Ovarian gene panel offered by  [facility] includes sequencing and rearrangement analysis for the following 20 genes:  ATM, BARD1, BRCA1, BRCA2, BRIP1, CDH1, CHEK2, EPCAM, FANCC, MLH1, MSH2, MSH6, NBN, PALB2, PMS2, PTEN, RAD51C, RAD51D, TP53, and XRCC2.   The report date is May 11, 2015.   [name] GERD (gastroesophageal reflux disease)   . Hiatal hernia    with GERD  . Hypercholesteremia   . Hypertension   . Hyperthyroidism    pt. reports that she thinks she has hypothyroidism, states MD- [place] took her off med. in the past 1-2 yrs.   . Malignant neoplasm of upper-outer quadrant of right breast in female, estrogen receptor positive ([place]) 04/24/2015  . Mild cognitive impairment with memory loss   . Morbid obesity ([place])   . OSA (obstructive sleep apnea) 02/04/2014  . Paroxysmal atrial fibrillation ([place]) 02/04/2014  . Sleep apnea    severe with AHI 37/hr now on CPAP at 13cm H2O  . SVT (supraventricular tachycardia) ([place])    likely due to atrial flutter.  successfullly ablated by Dr Lovena Le 10/17    Past Surgical History:  Procedure Laterality Date  . ABLATION  06/2016   Dr. Delilah Shan  . APPENDECTOMY  28 years ago  . DILATION AND CURETTAGE OF UTERUS  1996    Polyp resection   . ELECTROPHYSIOLOGIC STUDY N/A 05/27/2016   Procedure: EPS/SVT Ablation;  Surgeon: Evans Lance, MD;  Location: [facility];  Service: Cardiovascular;  Laterality: N/A;  . HYSTEROSCOPY  4/12   D&C  . RADIOACTIVE SEED GUIDED PARTIAL MASTECTOMY WITH AXILLARY SENTINEL LYMPH NODE BIOPSY Right 05/18/2015   Procedure: RIGHT BREAST RADIOACTIVE SEED PARTIAL MASTECTOMY WITH RIGHT SENTINEL LYMPH NODE MAPPING;  Surgeon: Erroll Luna, MD;  Location: [facility];  Service: General;  Laterality: Right;  . TONSILLECTOMY    . TUBAL LIGATION      MEDS:   Current Outpatient Medications on File Prior to Visit  Medication Sig Dispense Refill  . acetaminophen (TYLENOL) 500 MG tablet Take 500 mg by mouth every 6 (six) hours as needed for pain.    [name] anastrozole  (ARIMIDEX) 1 MG tablet Take 1 tablet (1 mg total) by mouth daily. 90 tablet 4  . atorvastatin (LIPITOR) 20 MG tablet Take 20 mg by mouth daily after breakfast.     . hydrochlorothiazide (HYDRODIURIL) 25 MG tablet Take 1 tablet (25 mg total) by mouth daily. 90 tablet 3  . metoprolol tartrate (LOPRESSOR) 25 MG tablet Take 25 mg as directed by mouth. Take if palpations and heart rate are greater than 140 beats per minute. Can take up to three times daily    . Omega-3 Fatty Acids (FISH OIL) 1000 MG CAPS Take 1,000 mg by mouth daily.    . sotalol (BETAPACE) 120 MG tablet Take 1 tablet (120 mg total) by mouth every 12 (twelve) hours. 180 tablet 3  . traMADol (ULTRAM) 50 MG tablet Take 1 tablet (50 mg total) by mouth every 6 (six) hours as needed for moderate pain. 30 tablet 0  . vitamin B-12 (CYANOCOBALAMIN) 1000 MCG tablet Take 1,000 mcg by mouth daily.    . Vitamin D, Ergocalciferol, (DRISDOL) 50000 UNITS CAPS capsule Take 50,000 Units by mouth every 7 (seven) days. On Saturday    . warfarin (COUMADIN) 5 MG tablet Take 1.5 tablets (7.5 mg total) by mouth daily. Resume on 05/30/2016 (Patient taking differently: Take 5-7.5 mg by mouth See admin instructions. 18m M W F SAT and 7.511mT TH SUN)     No current facility-administered medications on file prior to visit.     ALLERGIES: Codeine; Adhesive [tape]; and Latex  Family History  Problem Relation Age of Onset  . Hypertension Mother   . Brain cancer Mother   . Hypertension Sister   . Thyroid disease Sister   . Hypertension Sister   . Hypertension Brother   . Thyroid disease Brother   . Diabetes Maternal Grandmother   . Heart attack Maternal Grandmother   . Breast cancer Paternal Aunt        dx in her late 308searly 405s. Melanoma Paternal Aunt   . Breast cancer Paternal Aunt   . Multiple myeloma Paternal Aunt   . Other Daughter [id number]     ITP    SH:  Married, non smoker  Review of Systems  Constitutional:       Weight gain    Gastrointestinal: Positive for abdominal pain.  Musculoskeletal: Positive for myalgias.  All other systems reviewed and are negative.   PHYSICAL EXAMINATION:    BP 124/64 (BP Location: Left Arm, Patient Position: Sitting, Cuff Size: Large)   Pulse 68   Resp 14   Ht '5\' 2"'  (1.575 m)   Wt 214 lb 12.8 oz (97.4 kg)   LMP 11/21/2010 Comment: spotting-had hysteroscopy/d&c  BMI 39.29 kg/m     General appearance: alert, cooperative and appears stated age Abdomen: soft, mild  RLQ tenderness to deep palpation, no rebound or guarding; bowel sounds normal; no masses,  no organomegaly  Pelvic: External genitalia:  no lesions              Urethra:  normal appearing urethra with no masses, tenderness or lesions              Bartholins and Skenes: normal                 Vagina: normal appearing vagina with normal color and discharge, no lesions              Cervix: no lesions    Pap obtained              Bimanual Exam:  Uterus:  normal size, contour, position, consistency, mobility, non-tender              Adnexa: no mass, fullness, tenderness              Anus:  no lesions  Chaperone was present for exam.  Assessment: RLQ pain H/O breast cancer with anxiety about new cancers  Plan: Will return for PUS.  If this is negative, consider CT.  Pt comfortable with plan. Pap obtained today as last one was obtained 5/17

## 2018-02-16 NOTE — Progress Notes (Signed)
Patient scheduled while in office for PUS on 02/19/18 at 12:30pm, consult to follow at 1pm with Dr.  Sabra Heck. Order placed. Patient verbalizes understanding and is agreeable.

## 2018-02-17 ENCOUNTER — Other Ambulatory Visit (HOSPITAL_COMMUNITY)
Admission: RE | Admit: 2018-02-17 | Discharge: 2018-02-17 | Disposition: A | Discharge status: Home / Self Care | Payer: Medicare Other | Source: Ambulatory Visit | Attending: Obstetrics & Gynecology | Admitting: Obstetrics & Gynecology

## 2018-02-17 DIAGNOSIS — R87610 Atypical squamous cells of undetermined significance on cytologic smear of cervix (ASC-US): Secondary | ICD-10-CM | POA: Diagnosis not present

## 2018-02-17 DIAGNOSIS — Z124 Encounter for screening for malignant neoplasm of cervix: Secondary | ICD-10-CM | POA: Diagnosis present

## 2018-02-19 ENCOUNTER — Ambulatory Visit: Payer: Medicare Other | Admitting: Obstetrics & Gynecology

## 2018-02-19 ENCOUNTER — Ambulatory Visit (INDEPENDENT_AMBULATORY_CARE_PROVIDER_SITE_OTHER): Payer: Medicare Other

## 2018-02-19 VITALS — BP 132/70 | HR 68 | Resp 16 | Ht 62.0 in | Wt 215.0 lb

## 2018-02-19 DIAGNOSIS — R1031 Right lower quadrant pain: Secondary | ICD-10-CM

## 2018-02-19 DIAGNOSIS — R102 Pelvic and perineal pain: Secondary | ICD-10-CM | POA: Diagnosis not present

## 2018-02-19 LAB — CYTOLOGY - PAP
Diagnosis: UNDETERMINED — AB
HPV: NOT DETECTED

## 2018-02-19 NOTE — Progress Notes (Signed)
67 y.o. G16P2002 Married Caucasian female here for pelvic ultrasound due to RLQ pain that has been present for several weeks.  Physical exam was normal.  Pt has hx of breast cancer and has increased anxiety with new symptoms especially pain.  Patient's last menstrual period was 11/21/2010.  Contraception: PMP  Findings:  UTERUS: 4.7 x 3.4 x 2.5cm EMS: 1.69mm ADNEXA: Left ovary: 1.5 x 0.8 x 1.0cm       Right ovary: 1.2 x 0.9 x 0.8cm CUL DE SAC: no free fluid  Discussion:  Exam was difficult due to gas/bowel presence but no significant abnormalities were noted.  As she only did a cologuard most recently, I feel she either needs to proceed with CT of abd/pelvis or colonoscopy for additional evaluation.  She is very reassured with the negative findings today.  She wants to wait a few more days to see if this resolves and she is feeling better today.  She admits this just may be due to the fact that the test was negative.  Assessment:  RLQ pain H/O breast cancer Obesity  Plan:  Consider proceeding with colonoscopy and/or CT abd/pelvis for additional evaluation.  ~15 minutes spent with patient >50% of time was in face to face discussion of above.

## 2018-02-20 LAB — BASIC METABOLIC PANEL
BUN/Creatinine Ratio: 18 (ref 12–28)
BUN: 12 mg/dL (ref 8–27)
CALCIUM: 9.5 mg/dL (ref 8.7–10.3)
CO2: 25 mmol/L (ref 20–29)
CREATININE: 0.68 mg/dL (ref 0.57–1.00)
Chloride: 96 mmol/L (ref 96–106)
GFR, EST AFRICAN AMERICAN: 105 mL/min/{1.73_m2} (ref >59)
GFR, EST NON AFRICAN AMERICAN: 91 mL/min/{1.73_m2} (ref >59)
Glucose: 93 mg/dL (ref 65–99)
Potassium: 3.8 mmol/L (ref 3.5–5.2)
Sodium: 137 mmol/L (ref 134–144)

## 2018-02-23 ENCOUNTER — Telehealth: Payer: Self-pay | Admitting: *Deleted

## 2018-02-23 NOTE — Telephone Encounter (Signed)
Notes recorded by Polly Cobia, CMA on 02/23/2018 at 8:40 AM EDT LM for pt with normal results and to call back to give update. Per pt release form.

## 2018-02-23 NOTE — Telephone Encounter (Signed)
Patient is returning a call to Reina. °

## 2018-02-23 NOTE — Telephone Encounter (Signed)
-----   Message from Megan Salon, MD sent at 02/23/2018  7:31 AM EDT ----- Please let pt know this is normal.  This was done in case her pain continues and she wants to proceed with CT of abdomen/pelvis.  Please get update from her.  Thanks.

## 2018-02-24 NOTE — Telephone Encounter (Signed)
Patient states she still has some pain, it is better but she still concern. Patient states she will wait 5 more days and see then if she wants to proceed with CT.   Patient would like [place] to pre-cert CT to see about the cost.  Please advise.

## 2018-02-25 ENCOUNTER — Encounter: Payer: Self-pay | Admitting: Obstetrics & Gynecology

## 2018-03-02 ENCOUNTER — Other Ambulatory Visit: Payer: Self-pay | Admitting: Obstetrics & Gynecology

## 2018-03-02 DIAGNOSIS — C50411 Malignant neoplasm of upper-outer quadrant of right female breast: Secondary | ICD-10-CM

## 2018-03-02 DIAGNOSIS — R1031 Right lower quadrant pain: Secondary | ICD-10-CM

## 2018-03-02 DIAGNOSIS — R102 Pelvic and perineal pain: Secondary | ICD-10-CM

## 2018-03-02 DIAGNOSIS — Z170 Estrogen receptor positive status [ER+]: Secondary | ICD-10-CM

## 2018-03-02 NOTE — Telephone Encounter (Signed)
Order for CT placed.  Routing to [employer] for [employer].  CT order may need to be adjusted.  Pt wants to know possible costs.

## 2018-03-02 NOTE — Telephone Encounter (Signed)
Notes recorded by Megan Salon, MD on 03/01/2018 at 11:45 PM EDT Please let pt know her pap showed ASCUS but HR HPV was negative so this is a normal pap in a PMP female. Question about CT scan has been sent to [facility] who will call her directly. 02 recall.

## 2018-03-12 ENCOUNTER — Telehealth: Payer: Self-pay | Admitting: Obstetrics & Gynecology

## 2018-03-12 DIAGNOSIS — R1084 Generalized abdominal pain: Secondary | ICD-10-CM

## 2018-03-12 NOTE — Telephone Encounter (Signed)
[facility] at [facility] called stating that the order for patient's pelvic CT scan for next week needs to be changed to CT abdomen pelvis with.

## 2018-03-16 NOTE — Progress Notes (Signed)
67 y.o. [id number] MarriedCaucasianF here for annual exam.  Continues to have RLQ pain.  Has CT scan scheduled on Friday.  Denies vaginal bleeding.    PCP:  Dr. Forde Dandy.  Has appt in October.  Will do blood work the week before. Cardiologist:  Dr. Radford Pax and Dr. Rayann Heman.  Wore monitor for a month in July.  Hasn't gotten results yet.    Patient's last menstrual period was 11/21/2010.          Sexually active: No.  The current method of family planning is post menopausal status.    Exercising: No.   Smoker:  no  Health Maintenance: Pap:  02/17/18 ASCUS. HR HPV:Neg   12/11/15 Neg. HR HPV:neg  History of abnormal Pap:  no MMG:  04/24/17 BIRADS2:Benign Colonoscopy: Cologuard 04/02/17 neg  BMD:   05/30/15 Normal  TDaP:  PCP  Pneumonia vaccine(s):  done Shingrix:   D/w new vaccination Hep C testing: PCP Screening Labs: PCP   reports that she has quit smoking. Her smoking use included cigarettes. She quit after 4.00 years of use. She quit smokeless tobacco use about 43 years ago. She reports that she does not drink alcohol or use drugs.  Past Medical History:  Diagnosis Date  . A-fib ([place])   . Allergic dermatitis   . Anxiety   . Arthritis    back, knees & ankles   . Atrial flutter ([place]) 05/25/2016  . Benign essential HTN 02/04/2014  . Breast cancer ([place])   . Cancer ([place])    skin- lower abdomen  . Depression   . Dyslipidemia 08/16/2011  . Family history of breast cancer   . Genetic testing 05/12/2015   Negative genetic testing on the Breast/Ovarian cancer panel.  The Breast/Ovarian gene panel offered by [facility] includes sequencing and rearrangement analysis for the following 20 genes:  ATM, BARD1, BRCA1, BRCA2, BRIP1, CDH1, CHEK2, EPCAM, FANCC, MLH1, MSH2, MSH6, NBN, PALB2, PMS2, PTEN, RAD51C, RAD51D, TP53, and XRCC2.   The report date is May 11, 2015.   [name] GERD (gastroesophageal reflux disease)   . Hiatal hernia    with GERD  . Hypercholesteremia   . Hypertension   . Hyperthyroidism    pt. reports that she thinks she has hypothyroidism, states MD- [place] took her off med. in the past 1-2 yrs.   . Malignant neoplasm of upper-outer quadrant of right breast in female, estrogen receptor positive ([place]) 04/24/2015  . Mild cognitive impairment with memory loss   . Morbid obesity ([place])   . OSA (obstructive sleep apnea) 02/04/2014  . Paroxysmal atrial fibrillation ([place]) 02/04/2014  . Sleep apnea    severe with AHI 37/hr now on CPAP at 13cm H2O  . SVT (supraventricular tachycardia) ([place])    likely due to atrial flutter.  successfullly ablated by Dr Lovena Le 10/17    Past Surgical History:  Procedure Laterality Date  . ABLATION  06/2016   Dr. Delilah Shan  . APPENDECTOMY  28 years ago  . DILATION AND CURETTAGE OF UTERUS  1996    Polyp resection   . ELECTROPHYSIOLOGIC STUDY N/A 05/27/2016   Procedure: EPS/SVT Ablation;  Surgeon: Evans Lance, MD;  Location: [facility];  Service: Cardiovascular;  Laterality: N/A;  . HYSTEROSCOPY  4/12   D&C  . RADIOACTIVE SEED GUIDED PARTIAL MASTECTOMY WITH AXILLARY SENTINEL LYMPH NODE BIOPSY Right 05/18/2015   Procedure: RIGHT BREAST RADIOACTIVE SEED PARTIAL MASTECTOMY WITH RIGHT SENTINEL LYMPH NODE MAPPING;  Surgeon: Erroll Luna, MD;  Location: [facility];  Service:  General;  Laterality: Right;  . TONSILLECTOMY    . TUBAL LIGATION      Current Outpatient Medications  Medication Sig Dispense Refill  . acetaminophen (TYLENOL) 500 MG tablet Take 500 mg by mouth every 6 (six) hours as needed for pain.    [name] anastrozole (ARIMIDEX) 1 MG tablet Take 1 tablet (1 mg total) by mouth daily. 90 tablet 4  . atorvastatin (LIPITOR) 20 MG tablet Take 20 mg by mouth daily after breakfast.     . hydrochlorothiazide (HYDRODIURIL) 25 MG tablet Take 1 tablet (25 mg total) by mouth daily. 90 tablet 3  . metoprolol tartrate (LOPRESSOR) 25 MG tablet Take 25 mg as directed by mouth. Take if palpations and heart rate are greater than 140 beats per minute. Can take up to  three times daily    . Omega-3 Fatty Acids (FISH OIL) 1000 MG CAPS Take 1,000 mg by mouth daily.    . sotalol (BETAPACE) 120 MG tablet Take 1 tablet (120 mg total) by mouth every 12 (twelve) hours. 180 tablet 3  . traMADol (ULTRAM) 50 MG tablet Take 1 tablet (50 mg total) by mouth every 6 (six) hours as needed for moderate pain. 30 tablet 0  . vitamin B-12 (CYANOCOBALAMIN) 1000 MCG tablet Take 1,000 mcg by mouth daily.    . Vitamin D, Ergocalciferol, (DRISDOL) 50000 UNITS CAPS capsule Take 50,000 Units by mouth every 7 (seven) days. On Saturday    . warfarin (COUMADIN) 5 MG tablet Take 1.5 tablets (7.5 mg total) by mouth daily. Resume on 05/30/2016 (Patient taking differently: Take 5-7.5 mg by mouth See admin instructions. 62m M W F SAT and 7.5101mT TH SUN)     No current facility-administered medications for this visit.     Family History  Problem Relation Age of Onset  . Hypertension Mother   . Brain cancer Mother   . Hypertension Sister   . Thyroid disease Sister   . Hypertension Sister   . Hypertension Brother   . Thyroid disease Brother   . Diabetes Maternal Grandmother   . Heart attack Maternal Grandmother   . Breast cancer Paternal Aunt        dx in her late [id number] [id number]. Melanoma Paternal Aunt   . Breast cancer Paternal Aunt   . Multiple myeloma Paternal Aunt   . Other Daughter [id number]     ITP    Review of Systems  Gastrointestinal: Positive for abdominal pain.  Musculoskeletal:       Swelling   All other systems reviewed and are negative.   Exam:   BP 122/64 (BP Location: Left Arm, Patient Position: Sitting, Cuff Size: Large)   Pulse 64   Resp 16   Ht 5' 2.25" (1.581 m)   Wt 214 lb 3.2 oz (97.2 kg)   LMP 11/21/2010 Comment: spotting-had hysteroscopy/d&c  BMI 38.86 kg/m     Height: 5' 2.25" (158.1 cm)  Ht Readings from Last 3 Encounters:  03/17/18 5' 2.25" (1.581 m)  02/19/18 '5\' 2"'  (1.575 m)  02/16/18 '5\' 2"'  (1.575 m)    General appearance: alert,  cooperative and appears stated age Head: Normocephalic, without obvious abnormality, atraumatic Neck: no adenopathy, supple, symmetrical, trachea midline and thyroid normal to inspection and palpation Lungs: clear to auscultation bilaterally Breasts: left breast without masses, skin changes, nipple discharge, LAD; right breast with radiation changes on lower 2/3rds of breast and significant thickening around scar in RLQ--stable.  No axillary LAD, no new  skin changes, no nipple discharge. Heart: regular rate and rhythm Abdomen: soft, non-tender; bowel sounds normal; no masses,  no organomegaly Extremities: extremities normal, atraumatic, no cyanosis or edema Skin: Skin color, texture, turgor normal. No rashes or lesions Lymph nodes: Cervical, supraclavicular, and axillary nodes normal. No abnormal inguinal nodes palpated Neurologic: Grossly normal   Pelvic: not done today.  Pelvic exam with Pap just done 7/19.  Chaperone was present for exam.  A:  Well Woman with normal exam PMP, no HRT RLQ pain that has been ongoing for several weeks H/O afib, on coumadin, h/o cardiac ablation Hypothyroidism H/O MI, followed by DR. Turner GERD Elevated lipids H/O breast cancer Stage 1A, ER/PR+, on anastrozole, diagnosed 2016  P:   Mammogram guidelines reviewed pap smear--ASCUS pap with neg HR HPV 7/19 Lab work planned with Dr. Forde Dandy in October Has CT scheduled for Friday Information about shingrix vaccination and order given. return annually or prn

## 2018-03-17 ENCOUNTER — Ambulatory Visit: Payer: Medicare Other | Admitting: Obstetrics & Gynecology

## 2018-03-17 ENCOUNTER — Encounter: Payer: Self-pay | Admitting: Obstetrics & Gynecology

## 2018-03-17 VITALS — BP 122/64 | HR 64 | Resp 16 | Ht 62.25 in | Wt 214.2 lb

## 2018-03-17 DIAGNOSIS — Z01419 Encounter for gynecological examination (general) (routine) without abnormal findings: Secondary | ICD-10-CM

## 2018-03-20 ENCOUNTER — Ambulatory Visit
Admission: RE | Admit: 2018-03-20 | Discharge: 2018-03-20 | Disposition: A | Discharge status: Home / Self Care | Payer: Medicare Other | Source: Ambulatory Visit | Attending: Obstetrics & Gynecology | Admitting: Obstetrics & Gynecology

## 2018-03-20 DIAGNOSIS — R1084 Generalized abdominal pain: Secondary | ICD-10-CM

## 2018-03-20 MED ORDER — IOPAMIDOL (ISOVUE-300) INJECTION 61%
125.0000 mL | Freq: Once | INTRAVENOUS | Status: AC | PRN
  Administered 2018-03-20: 125 mL via INTRAVENOUS

## 2018-03-23 ENCOUNTER — Telehealth: Payer: Self-pay | Admitting: Emergency Medicine

## 2018-03-23 NOTE — Telephone Encounter (Signed)
Return call to patient. Results from Dr. Sabra Heck discussed.  Questions answered about fatty liver-advised would not cause RLQ pain.   Declines to see GI at this time. She states she wants to think about it and will call back if desires referral. Pain has improved, but is not 100% gone.  Routing update to Dr. Sabra Heck and will close encounter.

## 2018-03-23 NOTE — Telephone Encounter (Signed)
-----   Message from Megan Salon, MD sent at 03/23/2018 10:00 AM EDT ----- Please let her know the CT showed only fatty liver but no other findings and no explanation for the RLQ pain.  Does she want to see GI at this time?

## 2018-04-15 ENCOUNTER — Encounter (HOSPITAL_COMMUNITY): Payer: Self-pay | Admitting: Emergency Medicine

## 2018-04-15 ENCOUNTER — Emergency Department (HOSPITAL_COMMUNITY): Payer: Medicare Other

## 2018-04-15 ENCOUNTER — Inpatient Hospital Stay (HOSPITAL_COMMUNITY)
Admission: EM | Admit: 2018-04-15 | Discharge: 2018-04-17 | DRG: 243 | Disposition: A | Discharge status: Home / Self Care | Payer: Medicare Other | Attending: Internal Medicine | Admitting: Internal Medicine

## 2018-04-15 ENCOUNTER — Other Ambulatory Visit: Payer: Self-pay

## 2018-04-15 DIAGNOSIS — I10 Essential (primary) hypertension: Secondary | ICD-10-CM | POA: Diagnosis present

## 2018-04-15 DIAGNOSIS — Z885 Allergy status to narcotic agent status: Secondary | ICD-10-CM | POA: Diagnosis not present

## 2018-04-15 DIAGNOSIS — I480 Paroxysmal atrial fibrillation: Secondary | ICD-10-CM

## 2018-04-15 DIAGNOSIS — I340 Nonrheumatic mitral (valve) insufficiency: Secondary | ICD-10-CM | POA: Diagnosis not present

## 2018-04-15 DIAGNOSIS — Z85828 Personal history of other malignant neoplasm of skin: Secondary | ICD-10-CM | POA: Diagnosis not present

## 2018-04-15 DIAGNOSIS — K219 Gastro-esophageal reflux disease without esophagitis: Secondary | ICD-10-CM | POA: Diagnosis present

## 2018-04-15 DIAGNOSIS — I495 Sick sinus syndrome: Secondary | ICD-10-CM | POA: Diagnosis present

## 2018-04-15 DIAGNOSIS — R000 Tachycardia, unspecified: Secondary | ICD-10-CM

## 2018-04-15 DIAGNOSIS — I472 Ventricular tachycardia: Secondary | ICD-10-CM | POA: Diagnosis present

## 2018-04-15 DIAGNOSIS — I455 Other specified heart block: Secondary | ICD-10-CM

## 2018-04-15 DIAGNOSIS — G4733 Obstructive sleep apnea (adult) (pediatric): Secondary | ICD-10-CM | POA: Diagnosis present

## 2018-04-15 DIAGNOSIS — Z950 Presence of cardiac pacemaker: Secondary | ICD-10-CM

## 2018-04-15 DIAGNOSIS — E669 Obesity, unspecified: Secondary | ICD-10-CM | POA: Diagnosis present

## 2018-04-15 DIAGNOSIS — Z853 Personal history of malignant neoplasm of breast: Secondary | ICD-10-CM | POA: Diagnosis not present

## 2018-04-15 DIAGNOSIS — Z79899 Other long term (current) drug therapy: Secondary | ICD-10-CM

## 2018-04-15 DIAGNOSIS — Z91040 Latex allergy status: Secondary | ICD-10-CM

## 2018-04-15 DIAGNOSIS — Z6837 Body mass index (BMI) 37.0-37.9, adult: Secondary | ICD-10-CM

## 2018-04-15 DIAGNOSIS — E0590 Thyrotoxicosis, unspecified without thyrotoxic crisis or storm: Secondary | ICD-10-CM | POA: Diagnosis present

## 2018-04-15 DIAGNOSIS — E7800 Pure hypercholesterolemia, unspecified: Secondary | ICD-10-CM | POA: Diagnosis present

## 2018-04-15 DIAGNOSIS — Z87891 Personal history of nicotine dependence: Secondary | ICD-10-CM

## 2018-04-15 DIAGNOSIS — Z7901 Long term (current) use of anticoagulants: Secondary | ICD-10-CM | POA: Diagnosis not present

## 2018-04-15 DIAGNOSIS — I4891 Unspecified atrial fibrillation: Secondary | ICD-10-CM

## 2018-04-15 LAB — CBC
HCT: 46.3 % — ABNORMAL HIGH (ref 36.0–46.0)
Hemoglobin: 15.3 g/dL — ABNORMAL HIGH (ref 12.0–15.0)
MCH: 31.2 pg (ref 26.0–34.0)
MCHC: 33 g/dL (ref 30.0–36.0)
MCV: 94.3 fL (ref 78.0–100.0)
Platelets: 236 10*3/uL (ref 150–400)
RBC: 4.91 MIL/uL (ref 3.87–5.11)
RDW: 12.7 % (ref 11.5–15.5)
WBC: 7.1 10*3/uL (ref 4.0–10.5)

## 2018-04-15 LAB — BASIC METABOLIC PANEL
Anion gap: 14 (ref 5–15)
BUN: 10 mg/dL (ref 8–23)
CO2: 26 mmol/L (ref 22–32)
Calcium: 10.6 mg/dL — ABNORMAL HIGH (ref 8.9–10.3)
Chloride: 97 mmol/L — ABNORMAL LOW (ref 98–111)
Creatinine, Ser: 0.72 mg/dL (ref 0.44–1.00)
GFR calc Af Amer: >60 mL/min (ref >60)
GFR calc non Af Amer: >60 mL/min (ref >60)
Glucose, Bld: 117 mg/dL — ABNORMAL HIGH (ref 70–99)
Potassium: 4 mmol/L (ref 3.5–5.1)
Sodium: 137 mmol/L (ref 135–145)

## 2018-04-15 LAB — PROTIME-INR
INR: 1.57
Prothrombin Time: 18.7 seconds — ABNORMAL HIGH (ref 11.4–15.2)

## 2018-04-15 LAB — TROPONIN I: Troponin I: <0.03 ng/mL (ref <0.03)

## 2018-04-15 LAB — MAGNESIUM: Magnesium: 2.1 mg/dL (ref 1.7–2.4)

## 2018-04-15 MED ORDER — ATORVASTATIN CALCIUM 20 MG PO TABS
20.0000 mg | ORAL_TABLET | Freq: Every day | ORAL | Status: DC
  Administered 2018-04-16 – 2018-04-17 (×2): 20 mg via ORAL
  Filled 2018-04-15 (×2): qty 1

## 2018-04-15 MED ORDER — CHLORHEXIDINE GLUCONATE 4 % EX LIQD
60.0000 mL | Freq: Once | CUTANEOUS | Status: AC
  Administered 2018-04-16: 4 via TOPICAL

## 2018-04-15 MED ORDER — SODIUM CHLORIDE 0.9% FLUSH: 3.0000 mL | INTRAVENOUS | Status: DC | PRN

## 2018-04-15 MED ORDER — SODIUM CHLORIDE 0.9 % IV SOLN
INTRAVENOUS | Status: DC
  Administered 2018-04-16: 05:00:00 via INTRAVENOUS

## 2018-04-15 MED ORDER — ANASTROZOLE 1 MG PO TABS
1.0000 mg | ORAL_TABLET | Freq: Every day | ORAL | Status: DC
  Administered 2018-04-16 – 2018-04-17 (×2): 1 mg via ORAL
  Filled 2018-04-15 (×2): qty 1

## 2018-04-15 MED ORDER — AMIODARONE IV BOLUS ONLY 150 MG/100ML
150.0000 mg | Freq: Once | INTRAVENOUS | Status: AC
  Administered 2018-04-15: 150 mg via INTRAVENOUS

## 2018-04-15 MED ORDER — CHLORHEXIDINE GLUCONATE 4 % EX LIQD
60.0000 mL | Freq: Once | CUTANEOUS | Status: AC
  Administered 2018-04-16: 4 via TOPICAL
  Filled 2018-04-15: qty 60

## 2018-04-15 MED ORDER — VITAMIN D 1000 UNITS PO TABS
1000.0000 [IU] | ORAL_TABLET | Freq: Every day | ORAL | Status: DC
  Administered 2018-04-16 – 2018-04-17 (×2): 1000 [IU] via ORAL
  Filled 2018-04-15 (×2): qty 1

## 2018-04-15 MED ORDER — SODIUM CHLORIDE 0.9 % IV SOLN
80.0000 mg | INTRAVENOUS | Status: AC
  Administered 2018-04-16: 80 mg
  Filled 2018-04-15: qty 2

## 2018-04-15 MED ORDER — VITAMIN B-12 1000 MCG PO TABS
1000.0000 ug | ORAL_TABLET | Freq: Every day | ORAL | Status: DC
  Administered 2018-04-16 – 2018-04-17 (×2): 1000 ug via ORAL
  Filled 2018-04-15 (×2): qty 1

## 2018-04-15 MED ORDER — ONDANSETRON HCL 4 MG/2ML IJ SOLN: 4.0000 mg | Freq: Four times a day (QID) | INTRAMUSCULAR | Status: DC | PRN

## 2018-04-15 MED ORDER — SODIUM CHLORIDE 0.9% FLUSH
3.0000 mL | Freq: Two times a day (BID) | INTRAVENOUS | Status: DC
  Administered 2018-04-16: 3 mL via INTRAVENOUS

## 2018-04-15 MED ORDER — CEFAZOLIN SODIUM-DEXTROSE 2-4 GM/100ML-% IV SOLN
2.0000 g | INTRAVENOUS | Status: AC
  Administered 2018-04-16: 2 g via INTRAVENOUS
  Filled 2018-04-15: qty 100

## 2018-04-15 MED ORDER — ACETAMINOPHEN 325 MG PO TABS: 650.0000 mg | ORAL_TABLET | ORAL | Status: DC | PRN

## 2018-04-15 MED ORDER — SODIUM CHLORIDE 0.9 % IV SOLN: 250.0000 mL | INTRAVENOUS | Status: DC

## 2018-04-15 MED ORDER — HYDROCHLOROTHIAZIDE 25 MG PO TABS
25.0000 mg | ORAL_TABLET | Freq: Every day | ORAL | Status: DC
  Administered 2018-04-16 – 2018-04-17 (×2): 25 mg via ORAL
  Filled 2018-04-15 (×2): qty 1

## 2018-04-15 NOTE — ED Notes (Signed)
Dr Caryl Comes at bedside.

## 2018-04-15 NOTE — H&P (Addendum)
Cardiology Consultation:   Patient ID: Courtney Dunlap; [id number]; Apr 27, 1951   Admit date: 04/15/2018 Date of Consult: 04/15/2018  Primary Care Provider: Reynold Bowen, MD Primary Cardiologist: Fransico Him, MD  Primary Electrophysiologist:  Dr. Rayann Heman   Patient Profile:   Courtney Dunlap is a 67 y.o. female with a hx of PAFib, HTN, HLD, OSA w/CPAP, hyperthyroid, obesity  who is being seen today for the evaluation of recurrent PAFib/NSVT at the request of Dr. Wilson Singer.  History of Present Illness:   [name] last saw Dr. Radford Pax in June for cardiology, at that time reported some sinking feeling in her chest though no palpitations or dizziness.  Jan this year she was admitted with recurrent Afib w/RVR her sotalol was up-titratred  She came to the ER with increased palpitations and new fleeting moments of near syncope.  She was found in Afib w/RVR, as well as pauses as long as 4-5 seconds And periods of NSVT >> IV bolus of amiodarone and has maintained SR since in the 60's  LABS K+ 4.0 Mag is pending BUN/Creat 10/0.72 WBC 7.1 H/H 15/46 Plts 236 INR 1.57  She is feeling well here in the ER now.  She reports sine her Jan increase in her sotalol, that she would have brief "twinges" of her AFib only.  Of late these have benn more frequent.  Yesterday she noted some lightheadedness associated with them.  She could tell today she was in Afib again and when checking her BP noted HR was high 120's.  She started to have moments that were fleeting like she may faint, but didn't and came to the ER.  This is a new symptom.  She has not been ill of late, no new medicines, no fever.  No changes to her exertional capacity, no CP or SOB.  Her PMD manages her warfarin, reports last week her INR was 2.3 and no changes were made to her dosing.  She has been compliant with all of her medicines including her warfarin. Last dose of her sotalol was this AM  AFib hx: AFib initially diagnosed several years ago,  well controlled with Sotalol, developed AFlutter and underwent CTI ablation 2017 Recurrent AFib episodes Jan 2019 with a hospital stay AAD: sotalol started 2013, dose increase 2019  Past Medical History:  Diagnosis Date  . A-fib ([place])   . Allergic dermatitis   . Anxiety   . Arthritis    back, knees & ankles   . Atrial flutter ([place]) 05/25/2016  . Benign essential HTN 02/04/2014  . Breast cancer ([place])   . Cancer ([place])    skin- lower abdomen  . Depression   . Dyslipidemia 08/16/2011  . Family history of breast cancer   . Genetic testing 05/12/2015   Negative genetic testing on the Breast/Ovarian cancer panel.  The Breast/Ovarian gene panel offered by [facility] includes sequencing and rearrangement analysis for the following 20 genes:  ATM, BARD1, BRCA1, BRCA2, BRIP1, CDH1, CHEK2, EPCAM, FANCC, MLH1, MSH2, MSH6, NBN, PALB2, PMS2, PTEN, RAD51C, RAD51D, TP53, and XRCC2.   The report date is May 11, 2015.   [name] GERD (gastroesophageal reflux disease)   . Hiatal hernia    with GERD  . Hypercholesteremia   . Hypertension   . Hyperthyroidism    pt. reports that she thinks she has hypothyroidism, states MD- [place] took her off med. in the past 1-2 yrs.   . Malignant neoplasm of upper-outer quadrant of right breast in female, estrogen receptor positive ([place]) 04/24/2015  .  Mild cognitive impairment with memory loss   . Morbid obesity ([place])   . OSA (obstructive sleep apnea) 02/04/2014  . Paroxysmal atrial fibrillation ([place]) 02/04/2014  . Sleep apnea    severe with AHI 37/hr now on CPAP at 13cm H2O  . SVT (supraventricular tachycardia) ([place])    likely due to atrial flutter.  successfullly ablated by Dr Lovena Le 10/17    Past Surgical History:  Procedure Laterality Date  . ABLATION  06/2016   Dr. Delilah Shan  . APPENDECTOMY  28 years ago  . DILATION AND CURETTAGE OF UTERUS  1996    Polyp resection   . ELECTROPHYSIOLOGIC STUDY N/A 05/27/2016   Procedure: EPS/SVT Ablation;  Surgeon: Evans Lance,  MD;  Location: [facility];  Service: Cardiovascular;  Laterality: N/A;  . HYSTEROSCOPY  4/12   D&C  . RADIOACTIVE SEED GUIDED PARTIAL MASTECTOMY WITH AXILLARY SENTINEL LYMPH NODE BIOPSY Right 05/18/2015   Procedure: RIGHT BREAST RADIOACTIVE SEED PARTIAL MASTECTOMY WITH RIGHT SENTINEL LYMPH NODE MAPPING;  Surgeon: Erroll Luna, MD;  Location: [facility];  Service: General;  Laterality: Right;  . TONSILLECTOMY    . TUBAL LIGATION       Home Medications:  Prior to Admission medications   Medication Sig Start Date End Date Taking? Authorizing Provider  acetaminophen (TYLENOL) 500 MG tablet Take 500 mg by mouth every 6 (six) hours as needed for pain.    [provider]  anastrozole (ARIMIDEX) 1 MG tablet Take 1 tablet (1 mg total) by mouth daily. 12/23/17   Magrinat, Virgie Dad, MD  atorvastatin (LIPITOR) 20 MG tablet Take 20 mg by mouth daily after breakfast.  07/27/13   [provider]  hydrochlorothiazide (HYDRODIURIL) 25 MG tablet Take 1 tablet (25 mg total) by mouth daily. 12/01/17   Sueanne Margarita, MD  metoprolol tartrate (LOPRESSOR) 25 MG tablet Take 25 mg as directed by mouth. Take if palpations and heart rate are greater than 140 beats per minute. Can take up to three times daily    [provider]  Omega-3 Fatty Acids (FISH OIL) 1000 MG CAPS Take 1,000 mg by mouth daily.    [provider]  sotalol (BETAPACE) 120 MG tablet Take 1 tablet (120 mg total) by mouth every 12 (twelve) hours. 08/26/17   [name], NP  traMADol (ULTRAM) 50 MG tablet Take 1 tablet (50 mg total) by mouth every 6 (six) hours as needed for moderate pain. 05/18/15   Cornett, [name], MD  vitamin B-12 (CYANOCOBALAMIN) 1000 MCG tablet Take 1,000 mcg by mouth daily.    [provider]  Vitamin D, Ergocalciferol, (DRISDOL) 50000 UNITS CAPS capsule Take 50,000 Units by mouth every 7 (seven) days. On Saturday 07/27/13   [provider]  warfarin (COUMADIN) 5 MG tablet  Take 1.5 tablets (7.5 mg total) by mouth daily. Resume on 05/30/2016 Patient taking differently: Take 5-7.5 mg by mouth See admin instructions. 16m M W F SAT and 7.581mT TH SUN 05/28/16   SePatsey BertholdNP    Inpatient Medications: Scheduled Meds:  Continuous Infusions:  PRN Meds:   Allergies:    Allergies  Allergen Reactions  . Codeine Swelling    Facial/lips  . Adhesive [Tape] Itching and Rash    MUST USE PAPER TAPE-CAUSES BLISTERS AND BRUISES ALSO  . Latex Itching and Rash    Social History:   Social History   Socioeconomic History  . Marital status: Married    Spouse name: Not on file  .  Number of children: 2  . Years of education: Not on file  . Highest education level: Not on file  Occupational History    Employer: [employer]  Social Needs  . Financial resource strain: Not on file  . Food insecurity:    Worry: Not on file    Inability: Not on file  . Transportation needs:    Medical: Not on file    Non-medical: Not on file  Tobacco Use  . Smoking status: Former Smoker    Years: 4.00    Types: Cigarettes  . Smokeless tobacco: Former Systems developer    Quit date: 05/15/1974  . Tobacco comment: quit 35-40 years ago  Substance and Sexual Activity  . Alcohol use: No    Alcohol/week: 3.0 standard drinks    Types: 3 Standard drinks or equivalent per week  . Drug use: No  . Sexual activity: Not Currently    Birth control/protection: Post-menopausal  Lifestyle  . Physical activity:    Days per week: Not on file    Minutes per session: Not on file  . Stress: Not on file  Relationships  . Social connections:    Talks on phone: Not on file    Gets together: Not on file    Attends religious service: Not on file    Active member of club or organization: Not on file    Attends meetings of clubs or organizations: Not on file    Relationship status: Not on file  . Intimate partner violence:    Fear of current or ex partner: Not on file    Emotionally  abused: Not on file    Physically abused: Not on file    Forced sexual activity: Not on file  Other Topics Concern  . Not on file  Social History Narrative  . Not on file    Family History:   Family History  Problem Relation Age of Onset  . Hypertension Mother   . Brain cancer Mother   . Hypertension Sister   . Thyroid disease Sister   . Hypertension Sister   . Hypertension Brother   . Thyroid disease Brother   . Diabetes Maternal Grandmother   . Heart attack Maternal Grandmother   . Breast cancer Paternal Aunt        dx in her late 67s- early 11s  . Melanoma Paternal Aunt   . Breast cancer Paternal Aunt   . Multiple myeloma Paternal Aunt   . Other Daughter 37       ITP     ROS:  Please see the history of present illness.  All other ROS reviewed and negative.     Physical Exam/Data:   Vitals:   04/15/18 0911 04/15/18 1000 04/15/18 1015 04/15/18 1031  BP: 124/68 (!) 153/104 (!) 154/80   Pulse: (!) 138 68 79   Resp: '20 18 17   ' Temp: 98 F (36.7 C)     TempSrc: Oral     SpO2: 100% 99% 100%   Weight:    93.4 kg  Height:    5' 2.5" (1.588 m)   No intake or output data in the 24 hours ending 04/15/18 1039 Filed Weights   04/15/18 1031  Weight: 93.4 kg   Body mass index is 37.08 kg/m.  General:  Well nourished, well developed, in no acute distress HEENT: normal Lymph: no adenopathy Neck: no JVD Endocrine:  No thryomegaly Vascular: No carotid bruits  Cardiac:  RRRR; no murmurs, gallops or rubs Lungs:  CTA b/l, no wheezing, rhonchi or rales  Abd: soft, nontender, no hepatomegaly  Ext: no edema Musculoskeletal:  No deformities Skin: warm and dry  Neuro:  No gross focal abnormalities noted Psych:  Normal affect   EKG:  The EKG was personally reviewed and demonstrates:   AFib 157bpm, QTc 430m #2 is SB 59bpm, QT measured 4218m QTc 41058melemetry:  Telemetry was personally reviewed and demonstrates:   SB/SR high 50's-60's since amiodarone, initially rapid  AFib 130's-160's, post termination pauses 4-4.5 seconds, and WCT episodes as well 25-30beats  Relevant CV Studies:  05/27/16: TTE Study Conclusions - Left ventricle: The cavity size was normal. There was moderate   concentric hypertrophy. Systolic function was normal. The   estimated ejection fraction was in the range of 55% to 60%. Wall   motion was normal; there were no regional wall motion   abnormalities. Left ventricular diastolic function parameters   were normal. - Atrial septum: No defect or patent foramen ovale was identified.  05/27/16: EPS/ablation CONCLUSIONS:  1. Isthmus-dependent right atrial flutter upon presentation.  2. Successful radiofrequency ablation of atrial flutter along the cavotricuspid isthmus with complete bidirectional isthmus block achieved.  3. No inducible arrhythmias following ablation.  4. No early apparent complications.   Laboratory Data:  Chemistry Recent Labs  Lab 04/15/18 0917  NA 137  K 4.0  CL 97*  CO2 26  GLUCOSE 117*  BUN 10  CREATININE 0.72  CALCIUM 10.6*  GFRNONAA >60  GFRAA >60  ANIONGAP 14    No results for input(s): PROT, ALBUMIN, AST, ALT, ALKPHOS, BILITOT in the last 168 hours. Hematology Recent Labs  Lab 04/15/18 0917  WBC 7.1  RBC 4.91  HGB 15.3*  HCT 46.3*  MCV 94.3  MCH 31.2  MCHC 33.0  RDW 12.7  PLT 236   Cardiac EnzymesNo results for input(s): TROPONINI in the last 168 hours. No results for input(s): TROPIPOC in the last 168 hours.  BNPNo results for input(s): BNP, PROBNP in the last 168 hours.  DDimer No results for input(s): DDIMER in the last 168 hours.  Radiology/Studies:  No results found.  Assessment and Plan:   1. Paroxysmal AFib w/RVR     CHA2DS2Vasc is at least 3 on Warfarin     She is in SR now     INR is subtherapeutic      She is maintaining SR now, given thoughts of pacing tomorrow, hold off on warfarin and a/c Stop sotalol Hold metoprolol given pauses   2. Near syncope 3.  Post termination pauses, likely the cause of her near syncope 4. nonsustained WCT, the individual episodes are monomorphic, but has episodes of different morphologies        These have settled since amio bolus and SR     Question if this is NSVT vs ashman's   QT looks OK She likely needs pacing for post termination pauses  Dr. KleCaryl Comess seen and examined the patient, WCT thought to be ashman's not NSVT, discussed with the patient likely need for pacing for post termination pauses, though will leave final decision on alternative AAD, ablation, pacing to Dr. AllRayann HemanI will place her on the procedure board for tomorrow as a tentative plan for PPM tomorrow     For questions or updates, please contact [facility] consult [email or web address] for contact info under Cardiology/STEMI.   Signed, RenBaldwin JamaicaA-C  04/15/2018 10:39 AM   Atrial fibriilation with rapid ventricular response  Posttermination pauses greater  than 6 seconds  Wide-complex tachycardia appears to be apparent fibrillation  History of atrial flutter status post ablation    The patient has recurrent atrial fibrillation no longer controlled by sotalol.  She is also having significant posttermination pauses.  The latter is the more concerning initial problem given its association with presyncope and risk of syncope.  I reviewed this with Dr. Rayann Heman my leaning is towards pacing  She will then need antiarrhythmic therapy for her atrial fibrillation.  This could include dofetilide, amiodarone or consideration for catheter ablation.  None of these however is sufficiently assured that it would clearly obviate the need for pacing for posttermination pauses.  There is some disagreement in the literature as to the role of ablation in this paradigm.  The patient knows Dr. Rayann Heman well and he will see her in the morning.

## 2018-04-15 NOTE — ED Notes (Signed)
Pt im room B14- placed on pacing pads on arrival to room, monitor showing Afib, RVR, then pauses, runs of VT, and NSR>  Pt becomes symptomatic with increased heart rate-feels light headed, flushed, diaphoretic.

## 2018-04-15 NOTE — ED Provider Notes (Addendum)
[facility] EMERGENCY DEPARTMENT Provider Note   CSN: [id number] Arrival date & time: 04/15/18  0907     History   Chief Complaint Chief Complaint  Patient presents with  . Atrial Fibrillation    HPI Courtney Dunlap is a 67 y.o. female.  HPI   67 year old female with palpitations.  She has a past history of A. fib.  It is not unusual for her to have palpitations but she is now also having near syncopal symptoms.  Intermittent since yesterday.  "Sinking feeling" in the center of her chest at times, but not really painful. Repots compliance with her meds. Took sotalol this am.   Past Medical History:  Diagnosis Date  . A-fib ([place])   . Allergic dermatitis   . Anxiety   . Arthritis    back, knees & ankles   . Atrial flutter ([place]) 05/25/2016  . Benign essential HTN 02/04/2014  . Breast cancer ([place])   . Cancer ([place])    skin- lower abdomen  . Depression   . Dyslipidemia 08/16/2011  . Family history of breast cancer   . Genetic testing 05/12/2015   Negative genetic testing on the Breast/Ovarian cancer panel.  The Breast/Ovarian gene panel offered by [facility] includes sequencing and rearrangement analysis for the following 20 genes:  ATM, BARD1, BRCA1, BRCA2, BRIP1, CDH1, CHEK2, EPCAM, FANCC, MLH1, MSH2, MSH6, NBN, PALB2, PMS2, PTEN, RAD51C, RAD51D, TP53, and XRCC2.   The report date is May 11, 2015.   [name] GERD (gastroesophageal reflux disease)   . Hiatal hernia    with GERD  . Hypercholesteremia   . Hypertension   . Hyperthyroidism    pt. reports that she thinks she has hypothyroidism, states MD- [place] took her off med. in the past 1-2 yrs.   . Malignant neoplasm of upper-outer quadrant of right breast in female, estrogen receptor positive ([place]) 04/24/2015  . Mild cognitive impairment with memory loss   . Morbid obesity ([place])   . OSA (obstructive sleep apnea) 02/04/2014  . Paroxysmal atrial fibrillation ([place]) 02/04/2014  . Sleep apnea    severe with AHI 37/hr  now on CPAP at 13cm H2O  . SVT (supraventricular tachycardia) ([place])    likely due to atrial flutter.  successfullly ablated by Dr Lovena Le 10/17    Patient Active Problem List   Diagnosis Date Noted  . SVT (supraventricular tachycardia) ([place]) 05/02/2016  . Morbid obesity ([place]) 07/24/2015  . Genetic testing 05/12/2015  . Family history of breast cancer   . Malignant neoplasm of upper-outer quadrant of right breast in female, estrogen receptor positive ([place]) 04/24/2015  . Paroxysmal atrial fibrillation ([place]) 02/04/2014  . Benign essential HTN 02/04/2014  . OSA (obstructive sleep apnea) 02/04/2014  . Dyslipidemia 08/16/2011    Past Surgical History:  Procedure Laterality Date  . ABLATION  06/2016   Dr. Delilah Shan  . APPENDECTOMY  28 years ago  . DILATION AND CURETTAGE OF UTERUS  1996    Polyp resection   . ELECTROPHYSIOLOGIC STUDY N/A 05/27/2016   Procedure: EPS/SVT Ablation;  Surgeon: Evans Lance, MD;  Location: [facility];  Service: Cardiovascular;  Laterality: N/A;  . HYSTEROSCOPY  4/12   D&C  . RADIOACTIVE SEED GUIDED PARTIAL MASTECTOMY WITH AXILLARY SENTINEL LYMPH NODE BIOPSY Right 05/18/2015   Procedure: RIGHT BREAST RADIOACTIVE SEED PARTIAL MASTECTOMY WITH RIGHT SENTINEL LYMPH NODE MAPPING;  Surgeon: Erroll Luna, MD;  Location: [facility];  Service: General;  Laterality: Right;  . TONSILLECTOMY    .  TUBAL LIGATION       OB History    Gravida  2   Para  2   Term  2   Preterm      AB      Living  2     SAB      TAB      Ectopic      Multiple      Live Births               Home Medications    Prior to Admission medications   Medication Sig Start Date End Date Taking? Authorizing Provider  acetaminophen (TYLENOL) 500 MG tablet Take 500 mg by mouth every 6 (six) hours as needed for pain.    [provider]  anastrozole (ARIMIDEX) 1 MG tablet Take 1 tablet (1 mg total) by mouth daily. 12/23/17   Magrinat, Virgie Dad, MD  atorvastatin  (LIPITOR) 20 MG tablet Take 20 mg by mouth daily after breakfast.  07/27/13   [provider]  hydrochlorothiazide (HYDRODIURIL) 25 MG tablet Take 1 tablet (25 mg total) by mouth daily. 12/01/17   Sueanne Margarita, MD  metoprolol tartrate (LOPRESSOR) 25 MG tablet Take 25 mg as directed by mouth. Take if palpations and heart rate are greater than 140 beats per minute. Can take up to three times daily    [provider]  Omega-3 Fatty Acids (FISH OIL) 1000 MG CAPS Take 1,000 mg by mouth daily.    [provider]  sotalol (BETAPACE) 120 MG tablet Take 1 tablet (120 mg total) by mouth every 12 (twelve) hours. 08/26/17   [name], NP  traMADol (ULTRAM) 50 MG tablet Take 1 tablet (50 mg total) by mouth every 6 (six) hours as needed for moderate pain. 05/18/15   Cornett, [name], MD  vitamin B-12 (CYANOCOBALAMIN) 1000 MCG tablet Take 1,000 mcg by mouth daily.    [provider]  Vitamin D, Ergocalciferol, (DRISDOL) 50000 UNITS CAPS capsule Take 50,000 Units by mouth every 7 (seven) days. On Saturday 07/27/13   [provider]  warfarin (COUMADIN) 5 MG tablet Take 1.5 tablets (7.5 mg total) by mouth daily. Resume on 05/30/2016 Patient taking differently: Take 5-7.5 mg by mouth See admin instructions. 35m M W F SAT and 7.538mT TH SUN 05/28/16   SePatsey BertholdNP    Family History Family History  Problem Relation Age of Onset  . Hypertension Mother   . Brain cancer Mother   . Hypertension Sister   . Thyroid disease Sister   . Hypertension Sister   . Hypertension Brother   . Thyroid disease Brother   . Diabetes Maternal Grandmother   . Heart attack Maternal Grandmother   . Breast cancer Paternal Aunt        dx in her late [id number]. Melanoma Paternal Aunt   . Breast cancer Paternal Aunt   . Multiple myeloma Paternal Aunt   . Other Daughter [id number]     ITP    Social History Social History   Tobacco Use  . Smoking status: Former Smoker     Years: 4.00    Types: Cigarettes  . Smokeless tobacco: Former [employer] developer  Quit date: 05/15/1974  . Tobacco comment: quit 35-40 years ago  Substance Use Topics  . Alcohol use: No    Alcohol/week: 3.0 standard drinks    Types: 3 Standard drinks or equivalent per week  . Drug use: No  Allergies   Codeine; Adhesive [tape]; and Latex   Review of Systems Review of Systems  All systems reviewed and negative, other than as noted in HPI.  Physical Exam Updated Vital Signs BP 124/68 (BP Location: Left Arm)   Pulse (!) 138   Temp 98 F (36.7 C) (Oral)   Resp 20   LMP 11/21/2010 Comment: spotting-had hysteroscopy/d&c  SpO2 100%   Physical Exam  Constitutional: She appears well-developed and well-nourished. No distress.  HENT:  Head: Normocephalic and atraumatic.  Eyes: Conjunctivae are normal. Right eye exhibits no discharge. Left eye exhibits no discharge.  Neck: Neck supple.  Cardiovascular: Normal heart sounds. Exam reveals no gallop and no friction rub.  No murmur heard. Tachy. irreg irreg.   Pulmonary/Chest: Effort normal and breath sounds normal. No respiratory distress.  Abdominal: Soft. She exhibits no distension. There is no tenderness.  Musculoskeletal: She exhibits no edema or tenderness.  Neurological: She is alert.  Skin: Skin is warm and dry.  Psychiatric: She has a normal mood and affect. Her behavior is normal. Thought content normal.  Nursing note and vitals reviewed.    ED Treatments / Results  Labs (all labs ordered are listed, but only abnormal results are displayed) Labs Reviewed  CBC - Abnormal; Notable for the following components:      Result Value   Hemoglobin 15.3 (*)    HCT 46.3 (*)    All other components within normal limits  PROTIME-INR - Abnormal; Notable for the following components:   Prothrombin Time 18.7 (*)    All other components within normal limits  BASIC METABOLIC PANEL  MAGNESIUM  TROPONIN I    EKG EKG  Interpretation  Date/Time:  Wednesday April 15 2018 09:15:07 EDT Ventricular Rate:  157 PR Interval:    QRS Duration: 78 QT Interval:  270 QTC Calculation: 436 R Axis:   32 Text Interpretation:  atrial fibrillation with rapid ventricular response Nonspecific ST and T wave abnormality Abnormal ECG Confirmed by Virgel Manifold [id number]) on 04/15/2018 10:17:54 AM   Radiology Dg Chest 2 View  Result Date: 04/17/2018 CLINICAL DATA:  Status post permanent pacemaker placement. EXAM: CHEST - 2 VIEW COMPARISON:  PA and lateral chest of April 15, 2018 FINDINGS: The lungs are mildly hyperinflated. There is no pneumothorax or pleural effusion. The heart is top-normal in size. The pulmonary vascularity is normal. The ICD is in reasonable position. The bony thorax exhibits no acute abnormality. IMPRESSION: There is no postprocedure complication following permanent pacemaker placement. Electronically Signed   By: David  Dunlap M.D.   On: 04/17/2018 09:56    Procedures Procedures (including critical care time)  CRITICAL CARE Performed by: Virgel Manifold Total critical care time: 35 minutes Critical care time was exclusive of separately billable procedures and treating other patients. Critical care was necessary to treat or prevent imminent or life-threatening deterioration. Critical care was time spent personally by me on the following activities: development of treatment plan with patient and/or surrogate as well as nursing, discussions with consultants, evaluation of patient's response to treatment, examination of patient, obtaining history from patient or surrogate, ordering and performing treatments and interventions, ordering and review of laboratory studies, ordering and review of radiographic studies, pulse oximetry and re-evaluation of patient's condition.   Medications Ordered in ED Medications  amiodarone (NEXTERONE) IV bolus only 150 mg/100 mL (has no administration in time range)      Initial Impression / Assessment and Plan / ED Course  I have reviewed the triage vital  signs and the nursing notes.  Pertinent labs & imaging results that were available during my care of the patient were reviewed by me and considered in my medical decision making (see chart for details).    67 year old female with palpitations and lightheadedness.  She has a history of A. fib with RVR.  She is primarily in  A. fib with a rapid ventricular rate currently.  She will occasionally have pauses of several seconds, sometimes have brief episodes of sinus rhythm and also episodes of nonsustained ventricular tachycardia.   She was placed on Zoll Pads.  Amiodarone ordered.  Cardiology consulted.  Final Clinical Impressions(s) / ED Diagnoses   Final diagnoses:  Atrial fibrillation with rapid ventricular response ([place])  Sinus pause  Wide-complex tachycardia [facility])    ED Discharge Orders    None       Virgel Manifold, MD 04/17/18 1543    Virgel Manifold, MD 04/17/18 1544

## 2018-04-15 NOTE — ED Notes (Signed)
Report called to [facility].

## 2018-04-15 NOTE — ED Triage Notes (Signed)
Patient to ED c/o sudden onset palpitations and lightheadedness this morning. History of A-Fib, took medication this morning. Denies CP. Resp e/u at rest, skin warm/dry.

## 2018-04-16 ENCOUNTER — Encounter (HOSPITAL_COMMUNITY): Payer: Self-pay | Admitting: Internal Medicine

## 2018-04-16 ENCOUNTER — Encounter (HOSPITAL_COMMUNITY)
Admission: EM | Disposition: A | Discharge status: Home / Self Care | Payer: Self-pay | Source: Home / Self Care | Attending: Internal Medicine

## 2018-04-16 ENCOUNTER — Inpatient Hospital Stay (HOSPITAL_COMMUNITY): Payer: Medicare Other

## 2018-04-16 DIAGNOSIS — I340 Nonrheumatic mitral (valve) insufficiency: Secondary | ICD-10-CM

## 2018-04-16 DIAGNOSIS — I495 Sick sinus syndrome: Principal | ICD-10-CM

## 2018-04-16 HISTORY — PX: PACEMAKER IMPLANT: EP1218

## 2018-04-16 LAB — PROTIME-INR
INR: 1.45
PROTHROMBIN TIME: 17.5 s — AB (ref 11.4–15.2)

## 2018-04-16 LAB — SURGICAL PCR SCREEN
MRSA, PCR: NEGATIVE
Staphylococcus aureus: NEGATIVE

## 2018-04-16 LAB — ECHOCARDIOGRAM COMPLETE
HEIGHTINCHES: 62.5 in
Weight: 3296 oz

## 2018-04-16 SURGERY — PACEMAKER IMPLANT

## 2018-04-16 MED ORDER — ACETAMINOPHEN 325 MG PO TABS
650.0000 mg | ORAL_TABLET | Freq: Four times a day (QID) | ORAL | Status: DC | PRN
  Administered 2018-04-16 – 2018-04-17 (×2): 650 mg via ORAL
  Filled 2018-04-16 (×2): qty 2

## 2018-04-16 MED ORDER — IOPAMIDOL (ISOVUE-370) INJECTION 76%
INTRAVENOUS | Status: DC | PRN
  Administered 2018-04-16: 15 mL via INTRAVENOUS

## 2018-04-16 MED ORDER — IOPAMIDOL (ISOVUE-370) INJECTION 76%
INTRAVENOUS | Status: AC
  Filled 2018-04-16: qty 50

## 2018-04-16 MED ORDER — HEPARIN (PORCINE) IN NACL 1000-0.9 UT/500ML-% IV SOLN
INTRAVENOUS | Status: AC
  Filled 2018-04-16: qty 500

## 2018-04-16 MED ORDER — MIDAZOLAM HCL 5 MG/5ML IJ SOLN
INTRAMUSCULAR | Status: AC
  Filled 2018-04-16: qty 5

## 2018-04-16 MED ORDER — CEFAZOLIN SODIUM-DEXTROSE 2-4 GM/100ML-% IV SOLN
INTRAVENOUS | Status: AC
  Filled 2018-04-16: qty 100

## 2018-04-16 MED ORDER — LIDOCAINE HCL 1 % IJ SOLN
INTRAMUSCULAR | Status: AC
  Filled 2018-04-16: qty 60

## 2018-04-16 MED ORDER — MIDAZOLAM HCL 5 MG/5ML IJ SOLN
INTRAMUSCULAR | Status: DC | PRN
  Administered 2018-04-16: 2 mg via INTRAVENOUS
  Administered 2018-04-16 (×3): 1 mg via INTRAVENOUS

## 2018-04-16 MED ORDER — SODIUM CHLORIDE 0.9 % IV SOLN: 250.0000 mL | INTRAVENOUS | Status: DC

## 2018-04-16 MED ORDER — FENTANYL CITRATE (PF) 100 MCG/2ML IJ SOLN
INTRAMUSCULAR | Status: DC | PRN
  Administered 2018-04-16: 25 ug via INTRAVENOUS
  Administered 2018-04-16 (×3): 12.5 ug via INTRAVENOUS

## 2018-04-16 MED ORDER — FLECAINIDE ACETATE 100 MG PO TABS
100.0000 mg | ORAL_TABLET | Freq: Two times a day (BID) | ORAL | Status: DC
  Administered 2018-04-16 – 2018-04-17 (×2): 100 mg via ORAL
  Filled 2018-04-16 (×2): qty 1

## 2018-04-16 MED ORDER — DILTIAZEM HCL ER COATED BEADS 120 MG PO CP24
120.0000 mg | ORAL_CAPSULE | Freq: Every day | ORAL | Status: DC
  Administered 2018-04-16 – 2018-04-17 (×2): 120 mg via ORAL
  Filled 2018-04-16 (×2): qty 1

## 2018-04-16 MED ORDER — SODIUM CHLORIDE 0.9 % IV SOLN
INTRAVENOUS | Status: AC
  Filled 2018-04-16: qty 2

## 2018-04-16 MED ORDER — HEPARIN (PORCINE) IN NACL 1000-0.9 UT/500ML-% IV SOLN
INTRAVENOUS | Status: DC | PRN
  Administered 2018-04-16: 500 mL

## 2018-04-16 MED ORDER — FENTANYL CITRATE (PF) 100 MCG/2ML IJ SOLN
INTRAMUSCULAR | Status: AC
  Filled 2018-04-16: qty 2

## 2018-04-16 MED ORDER — LIDOCAINE HCL (PF) 1 % IJ SOLN
INTRAMUSCULAR | Status: DC | PRN
  Administered 2018-04-16: 60 mL

## 2018-04-16 MED ORDER — LIDOCAINE HCL 1 % IJ SOLN
INTRAMUSCULAR | Status: AC
  Filled 2018-04-16: qty 20

## 2018-04-16 SURGICAL SUPPLY — 12 items
CABLE SURGICAL S-101-97-12 (CABLE) ×2 IMPLANT
CATH RIGHTSITE C315HIS02 (CATHETERS) ×1 IMPLANT
IPG PACE AZUR XT DR MRI W1DR01 (Pacemaker) IMPLANT
LEAD CAPSURE NOVUS 5076-52CM (Lead) ×1 IMPLANT
LEAD SELECT SECURE 3830 383069 (Lead) IMPLANT
PACE AZURE XT DR MRI W1DR01 (Pacemaker) ×2 IMPLANT
PAD PRO RADIOLUCENT 2001M-C (PAD) ×2 IMPLANT
SELECT SECURE 3830 383069 (Lead) ×2 IMPLANT
SHEATH CLASSIC 7F (SHEATH) ×2 IMPLANT
SLITTER 6232ADJ (MISCELLANEOUS) ×1 IMPLANT
TRAY PACEMAKER INSERTION (PACKS) ×2 IMPLANT
WIRE HI TORQ VERSACORE-J 145CM (WIRE) ×1 IMPLANT

## 2018-04-16 NOTE — Progress Notes (Signed)
  Echocardiogram 2D Echocardiogram has been performed.  Courtney Dunlap 04/16/2018, 9:31 AM

## 2018-04-16 NOTE — Progress Notes (Signed)
Doing well s/p PPM implant  Will start diltiazem CD 120mg  daily Also start flecainide 100mg  BID (first done tonight).  Will need follow-up ekg in AF clinic next week  Thompson Grayer MD, [facility] 04/16/2018 12:47 PM

## 2018-04-16 NOTE — H&P (View-Only) (Signed)
Doing well s/p PPM implant  Will start diltiazem CD 120mg  daily Also start flecainide 100mg  BID (first done tonight).  Will need follow-up ekg in AF clinic next week  Thompson Grayer MD, [facility] 04/16/2018 12:47 PM

## 2018-04-16 NOTE — Progress Notes (Addendum)
Progress Note  Patient Name: Courtney Dunlap Date of Encounter: 04/16/2018  Primary Cardiologist: Fransico Him, MD   Subjective   Continues to feel well, no CP or SOB, no palpitations  Inpatient Medications    Scheduled Meds: . anastrozole  1 mg Oral Daily  . atorvastatin  20 mg Oral QPC breakfast  . cholecalciferol  1,000 Units Oral Daily  . gentamicin irrigation  80 mg Irrigation On Call  . hydrochlorothiazide  25 mg Oral Daily  . sodium chloride flush  3 mL Intravenous Q12H  . vitamin B-12  1,000 mcg Oral Daily   Continuous Infusions: . sodium chloride 50 mL/hr at 04/16/18 0515  . sodium chloride    .  ceFAZolin (ANCEF) IV     PRN Meds: acetaminophen, ondansetron (ZOFRAN) IV, sodium chloride flush   Vital Signs    Vitals:   04/15/18 1445 04/15/18 1512 04/15/18 2114 04/16/18 0551  BP: (!) 120/56 137/63 123/63 137/71  Pulse: (!) 52 68 (!) 58 (!) 57  Resp: 12 16 13    Temp:  98.2 F (36.8 C) 97.8 F (36.6 C) 97.8 F (36.6 C)  TempSrc:  Oral Oral Oral  SpO2: 100% 100% 98% 99%  Weight:      Height:        Intake/Output Summary (Last 24 hours) at 04/16/2018 0910 Last data filed at 04/15/2018 1700 Gross per 24 hour  Intake 340 ml  Output 1000 ml  Net -660 ml   Filed Weights   04/15/18 1031  Weight: 93.4 kg    Telemetry    SB/SR generally 50's-60's- Personally Reviewed  ECG    No new EKGs - Personally Reviewed  Physical Exam   GEN: No acute distress.   Neck: No JVD Cardiac: RRR, no murmurs, rubs, or gallops.  Respiratory: Clear to auscultation bilaterally. GI: Soft, nontender, non-distended  MS: No edema; No deformity. Neuro:  Nonfocal  Psych: Normal affect   Labs    Chemistry Recent Labs  Lab 04/15/18 0917  NA 137  K 4.0  CL 97*  CO2 26  GLUCOSE 117*  BUN 10  CREATININE 0.72  CALCIUM 10.6*  GFRNONAA >60  GFRAA >60  ANIONGAP 14     Hematology Recent Labs  Lab 04/15/18 0917  WBC 7.1  RBC 4.91  HGB 15.3*  HCT 46.3*  MCV  94.3  MCH 31.2  MCHC 33.0  RDW 12.7  PLT 236    Cardiac Enzymes Recent Labs  Lab 04/15/18 0917  TROPONINI <0.03   No results for input(s): TROPIPOC in the last 168 hours.   BNPNo results for input(s): BNP, PROBNP in the last 168 hours.   DDimer No results for input(s): DDIMER in the last 168 hours.   Radiology    Dg Chest 2 View Result Date: 04/15/2018 CLINICAL DATA:  Tachycardia this morning. EXAM: CHEST - 2 VIEW COMPARISON:  Single-view of the chest 08/23/2017. PA and lateral chest 07/11/2015. FINDINGS: There is cardiomegaly without edema. No pneumothorax or pleural effusion. No acute or focal bony abnormality. IMPRESSION: Cardiomegaly without acute disease. Electronically Signed   By: Inge Rise M.D.   On: 04/15/2018 11:09    Cardiac Studies   05/27/16: TTE Study Conclusions - Left ventricle: The cavity size was normal. There was moderate concentric hypertrophy. Systolic function was normal. The estimated ejection fraction was in the range of 55% to 60%. Wall motion was normal; there were no regional wall motion abnormalities. Left ventricular diastolic function parameters were normal. - Atrial  septum: No defect or patent foramen ovale was identified.  05/27/16: EPS/ablation CONCLUSIONS:  1. Isthmus-dependent right atrial flutter upon presentation.  2. Successful radiofrequency ablation of atrial flutter along the cavotricuspid isthmus with complete bidirectional isthmus block achieved.  3. No inducible arrhythmias following ablation.  4. No early apparent complications.   Patient Profile     67 y.o. female with a hx of PAFib, HTN, HLD, OSA w/CPAP, hyperthyroid, obesity, with increased palpitations and new fleeting moments of near syncope.  She was found in Afib w/RVR, as well as pauses as long as 4-6 seconds And periods of WCT >> IV bolus of amiodarone in the ER >>SR   AFib hx: AFib initially diagnosed several years ago, well controlled with  Sotalol, developed AFlutter and underwent CTI ablation 2017 Recurrent AFib episodes Jan 2019 with a hospital stay AAD: sotalol started 2013, dose increase 2019  Assessment & Plan    1. Paroxysmal AFib w/RVR     CHA2DS2Vasc is at least 3 on Warfarin     maintaining SR since ER yesterday     INR is subtherapeutic >> resume warfarin post pacer tonight likely, INR today 1.4    Felt to have failed Sotalol at this point Tachy-brady with rapid AFib and long post termination pauses, for pacer today Discussed PPM implant rational, procedure, potential risks and she remains willing to proceed Echo is completed this morning, pending read   Dr. Rayann Heman will see later today     2. Near syncope 3. Post termination pauses, likely the cause of her near syncope 4. nonsustained WCT, the individual episodes are monomorphic, but has episodes of different morphologies        These have settled since amio bolus and SR     Dr. Caryl Comes in review felt to be ashman's, not NSVT    For questions or updates, please contact [facility] Please consult [email or web address] for contact info under Cardiology/STEMI.      Signed, Baldwin [place], PA-C  04/16/2018, 9:10 AM     I have seen, examined the patient, and reviewed the above assessment and plan.  Changes to above are made where necessary.  On exam, RRR.  We discussed af treatment options including AAD therapy and ablation.  We also discussed risks and benefits to PPM implantation.  She is clear at this time that she would like to have transvenous PPM implanted.  She has found her presyncope to be very anxiety provoking. The patient has symptomatic sinus bradycardia.  I would therefore recommend pacemaker implantation at this time.  Risks, benefits, alternatives to pacemaker implantation were discussed in detail with the patient today. The patient understands that the risks include but are not limited to bleeding, infection, pneumothorax, perforation, tamponade,  vascular damage, renal failure, MI, stroke, death,  and lead dislodgement and wishes to proceed.    Consider switching sotalol to flecainide post PPM implant.  She will need follow-up in AF clinic in 3-5 days.   Co Sign: Thompson Grayer, MD 04/16/2018 10:53 AM

## 2018-04-16 NOTE — Interval H&P Note (Signed)
History and Physical Interval Note:  04/16/2018 12:51 PM  Courtney Dunlap  has presented today for surgery, with the diagnosis of bradicardia  The various methods of treatment have been discussed with the patient and family. After consideration of risks, benefits and other options for treatment, the patient has consented to  Procedure(s): PACEMAKER IMPLANT (N/A) as a surgical intervention .  The patient's history has been reviewed, patient examined, no change in status, stable for surgery.  I have reviewed the patient's chart and labs.  Questions were answered to the patient's satisfaction.     Thompson Grayer

## 2018-04-17 ENCOUNTER — Inpatient Hospital Stay (HOSPITAL_COMMUNITY): Payer: Medicare Other

## 2018-04-17 MED ORDER — DIPHENHYDRAMINE HCL 50 MG/ML IJ SOLN: 25.0000 mg | Freq: Once | INTRAMUSCULAR | Status: DC

## 2018-04-17 MED ORDER — DILTIAZEM HCL ER COATED BEADS 120 MG PO CP24: 120.0000 mg | ORAL_CAPSULE | Freq: Every day | ORAL | 1 refills | Status: DC

## 2018-04-17 MED ORDER — VANCOMYCIN HCL IN DEXTROSE 1-5 GM/200ML-% IV SOLN
1000.0000 mg | Freq: Once | INTRAVENOUS | Status: AC
  Administered 2018-04-17: 1000 mg via INTRAVENOUS
  Filled 2018-04-17: qty 200

## 2018-04-17 MED ORDER — FLECAINIDE ACETATE 100 MG PO TABS: 100.0000 mg | ORAL_TABLET | Freq: Two times a day (BID) | ORAL | 1 refills | Status: DC

## 2018-04-17 MED FILL — Lidocaine HCl Local Inj 1%: INTRAMUSCULAR | Qty: 60 | Status: AC

## 2018-04-17 NOTE — Discharge Summary (Addendum)
ELECTROPHYSIOLOGY PROCEDURE DISCHARGE SUMMARY    Patient ID: Courtney Dunlap,  MRN: [id number], DOB/AGE: Dec 29, 1950 67 y.o.  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  Paroxysmal atrial fibrillation 2.  Hypertension 3.  Hyperlipidemia 4.  OSA on CPAP 5.  Hyperthyroidism 6.  Obesity  Allergies  Allergen Reactions  . Codeine Swelling    Facial/lips  . Adhesive [Tape] Itching and Rash    MUST USE PAPER TAPE-CAUSES BLISTERS AND BRUISES ALSO  . Latex Itching and Rash     Procedures This Admission:  1.  Implantation of a MDT dual chamber PPM on 04/16/18 by Dr Rayann Heman.  The patient received a MDT model number Azure PPM with model number 5076 right atrial lead and 3830 right ventricular lead. There were no immediate post procedure complications. 2.  CXR on 04/17/18 demonstrated no pneumothorax status post device implantation.   Brief HPI/Hospital Course:  Courtney Dunlap is a 67 y.o. female with the above past medical history. She presented to the ER for recurrent atrial fibrillation and near syncope. She was found to have AF with RVR as well as post termination pauses of up to 5 seconds.  She was admitted and risks, benefits, and alternatives to PPM implantation were reviewed with the patient who wished to proceed.  The patient underwent implantation of a MDT dual chamber (HIS bundle) with details as outlined above.  She  was monitored on telemetry overnight which demonstrated atrial pacing.  Left chest was without hematoma or ecchymosis.  The device was interrogated and found to be functioning normally.  CXR was obtained and demonstrated no pneumothorax status post device implantation.  Wound care, arm mobility, and restrictions  were reviewed with the patient.  The patient was examined and considered stable for discharge to home.   Her sotalol has been discontinued and she has been started on flecainide and Diltiazem at discharge. She will have an EKG next week in the AF clinic to assess for any changes with new Flecainide.    Physical Exam: Vitals:   04/16/18 2100 04/16/18 2300 04/17/18 0521 04/17/18 0810  BP: (!) 129/58 (!) 151/76 (!) 147/78 (!) 162/77  Pulse: 61 63 60   Resp: 16 16 15    Temp: 97.8 F (36.6 C) 97.9 F (36.6 C) 97.7 F (36.5 C)   TempSrc: Oral Oral Oral   SpO2: 98% 97% 98%   Weight:   93.7 kg   Height:        GEN- The patient is well appearing, alert and oriented x 3 today.   HEENT: normocephalic, atraumatic; sclera clear, conjunctiva pink; hearing intact; oropharynx clear; neck supple  Lungs- Clear to ausculation bilaterally, normal work of breathing.  No wheezes, rales, rhonchi Heart- Regular rate and rhythm  GI- soft, non-tender, non-distended, bowel sounds present  Extremities- no clubbing, cyanosis, or edema  MS- no significant deformity or atrophy Skin- warm and dry, no rash or lesion, left chest without hematoma/ecchymosis Psych- euthymic mood, full affect Neuro- strength and sensation are intact   Labs:   Lab Results  Component Value Date   WBC 7.1 04/15/2018   HGB 15.3 (H) 04/15/2018   HCT 46.3 (H) 04/15/2018   MCV 94.3 04/15/2018   PLT 236 04/15/2018    Recent Labs  Lab 04/15/18 0917  NA 137  K 4.0  CL 97*  CO2 26  BUN 10  CREATININE 0.72  CALCIUM 10.6*  GLUCOSE 117*    Discharge Medications:  Allergies as of 04/17/2018      Reactions   Codeine Swelling   Facial/lips   Adhesive [tape] Itching, Rash   MUST USE PAPER TAPE-CAUSES BLISTERS AND BRUISES ALSO   Latex Itching, Rash      Medication List    STOP taking these medications   metoprolol tartrate 25 MG tablet Commonly known as:  LOPRESSOR   sotalol 120 MG tablet Commonly known as:   BETAPACE     TAKE these medications   anastrozole 1 MG tablet Commonly known as:  ARIMIDEX Take 1 tablet (1 mg total) by mouth daily.   atorvastatin 20 MG tablet Commonly known as:  LIPITOR Take 20 mg by mouth daily after breakfast.   cholecalciferol 1000 units tablet Commonly known as:  VITAMIN D Take 1,000 Units by mouth daily.   diltiazem 120 MG 24 hr capsule Commonly known as:  CARDIZEM CD Take 1 capsule (120 mg total) by mouth daily. Start taking on:  04/18/2018   Fish Oil 1000 MG Caps Take 1,000 mg by mouth daily.   flecainide 100 MG tablet Commonly known as:  TAMBOCOR Take 1 tablet (100 mg total) by mouth every 12 (twelve) hours.   hydrochlorothiazide 25 MG tablet Commonly known as:  HYDRODIURIL Take 1 tablet (25 mg total) by mouth daily.   traMADol 50 MG tablet Commonly known as:  ULTRAM Take 1 tablet (50 mg total) by mouth every 6 (six) hours as needed for moderate pain.   TYLENOL 500 MG tablet Generic drug:  acetaminophen Take 500 mg by mouth every 6 (six) hours as needed for pain.   vitamin B-12 1000 MCG tablet Commonly known as:  CYANOCOBALAMIN Take 1,000 mcg by mouth daily.   warfarin 5 MG tablet Commonly known as:  COUMADIN Take 1.5 tablets (7.5 mg total) by mouth daily. Resume on 05/30/2016 What changed:    how much to take  when to take this  additional instructions       Disposition:  Discharge Instructions    Diet - low sodium heart healthy   Complete by:  As directed    Increase activity slowly   Complete by:  As directed      Follow-up Information    [facility] Follow up on 04/30/2018.   Specialty:  Cardiology Why:  11:30AM, wound check visit Contact information: [address] [place]       Thompson Grayer, MD Follow up on 07/20/2018.   Specialty:  Cardiology Why:  10:45AM Contact information: [address] [place]  [address] [place] Follow up on 04/23/2018.   Specialty:  Cardiology Why:  at [facility] information: [address] [id number] [place] [phone]          Duration of Discharge Encounter: Greater than 30 minutes including physician time.  Signed, Chanetta Marshall, NP 04/17/2018 9:23 AM   I have seen, examined the patient, and reviewed the above assessment and plan.  Changes to above are made where necessary.  On exam, RRR.  Device pocket is without hematoma.  Device interrogation is reviewed and normal.  Flecainide started.  Would follow-up in AF clinic next week and again in 4-6 weeks.  I will see  in 3 months.  Co Sign: Thompson Grayer, MD 04/17/2018 2:28 PM

## 2018-04-17 NOTE — Progress Notes (Signed)
Pt complaining of scalp itching after Vancomycin. Will give one dose of Benadryl and monitor. Hopefully to discharge home later today.  Chanetta Marshall, NP 04/17/2018 12:21 PM

## 2018-04-17 NOTE — Progress Notes (Signed)
Pt has indicated the itching went away and she feels better. She had asked if she can go now. Had discussed with pt that she can take benadryl but pt had refused the medicine stating " I feel better now and I can  leave now".

## 2018-04-17 NOTE — Discharge Instructions (Signed)
° ° °  Supplemental Discharge Instructions for  Pacemaker/Defibrillator Patients  Activity No heavy lifting or vigorous activity with your left/right arm for 6 to 8 weeks.  Do not raise your left/right arm above your head for one week.  Gradually raise your affected arm as drawn below.             04/20/18                       04/1018                     04/22/18                   04/23/18 __  NO DRIVING for  1 week   ; you may begin driving on   3/57/01 .  WOUND CARE - Keep the wound area clean and dry.  Do not get this area wet, no showers until cleared to at your wound check visit. - The tape/steri-strips on your wound will fall off; do not pull them off.  No bandage is needed on the site.  DO  NOT apply any creams, oils, or ointments to the wound area. - If you notice any drainage or discharge from the wound, any swelling or bruising at the site, or you develop a fever > 101? F after you are discharged home, call the office at once.  Special Instructions - You are still able to use cellular telephones; use the ear opposite the side where you have your pacemaker/defibrillator.  Avoid carrying your cellular phone near your device. - When traveling through airports, show security personnel your identification card to avoid being screened in the metal detectors.  Ask the security personnel to use the hand wand. - Avoid arc welding equipment, MRI testing (magnetic resonance imaging), TENS units (transcutaneous nerve stimulators).  Call the office for questions about other devices. - Avoid electrical appliances that are in poor condition or are not properly grounded. - Microwave ovens are safe to be near or to operate.

## 2018-04-20 ENCOUNTER — Telehealth: Payer: Self-pay | Admitting: Internal Medicine

## 2018-04-20 NOTE — Telephone Encounter (Signed)
New message:     Pt c/o medication issue:  1. Name of Medication: Warfarin  2. How are you currently taking this medication (dosage and times per day)? Take 1.5 tablets (7.5 mg total) by mouth daily. Resume on 05/30/2016 Patient taking differently: Take 5-7.5 mg by mouth See admin instructions. 5mg  M W F Sun and 7.5mg  T TH Sat  3. Are you having a reaction (difficulty breathing--STAT)? No  4. What is your medication issue? Pt would like to know when she should start back taking this medication.

## 2018-04-20 NOTE — Telephone Encounter (Signed)
Returned call to Pt.  Advised Pt ok to restart warfarin tonight 04/20/2018 per Dr. Rayann Heman.  Pt warfarin managed by her PCP.  Advised to call PCP and let them know she was restarting her warfarin tonight and they would know when to schedule follow up for further management.  Pt indicates understanding. Thankful for call.

## 2018-04-23 ENCOUNTER — Ambulatory Visit (HOSPITAL_COMMUNITY)
Admit: 2018-04-23 | Discharge: 2018-04-23 | Disposition: A | Discharge status: Home / Self Care | Payer: Medicare Other | Source: Ambulatory Visit | Attending: Nurse Practitioner | Admitting: Nurse Practitioner

## 2018-04-23 DIAGNOSIS — I481 Persistent atrial fibrillation: Secondary | ICD-10-CM | POA: Diagnosis present

## 2018-04-23 DIAGNOSIS — Z79899 Other long term (current) drug therapy: Secondary | ICD-10-CM | POA: Diagnosis not present

## 2018-04-23 NOTE — Progress Notes (Addendum)
Pt in for repeat EKG after stopping Sotalol and starting flecainide.   To be reviewed by Roderic Palau, NP  Ekg reviewed on flecainide, NSR with IRBBB, Pr int 154 ms, qrs int 96 ms, qtc 432 ms F/u with device clinic 9/7/ Dr. Radford Pax 9/23

## 2018-04-30 ENCOUNTER — Ambulatory Visit (INDEPENDENT_AMBULATORY_CARE_PROVIDER_SITE_OTHER): Payer: Medicare Other | Admitting: *Deleted

## 2018-04-30 DIAGNOSIS — Z950 Presence of cardiac pacemaker: Secondary | ICD-10-CM | POA: Diagnosis not present

## 2018-04-30 DIAGNOSIS — I480 Paroxysmal atrial fibrillation: Secondary | ICD-10-CM

## 2018-04-30 DIAGNOSIS — I495 Sick sinus syndrome: Secondary | ICD-10-CM

## 2018-04-30 LAB — CUP PACEART INCLINIC DEVICE CHECK
Battery Remaining Longevity: 169 mo
Battery Voltage: 3.22 V
Brady Statistic AP VP Percent: 0 %
Brady Statistic AP VS Percent: 30.27 %
Brady Statistic AS VP Percent: 0 %
Brady Statistic AS VS Percent: 69.72 %
Brady Statistic RA Percent Paced: 30.31 %
Brady Statistic RV Percent Paced: 0 %
Date Time Interrogation Session: 20190919161957
Implantable Lead Implant Date: 20190905
Implantable Lead Implant Date: 20190905
Implantable Lead Location: 753859
Implantable Lead Location: 753860
Implantable Lead Model: 3830
Implantable Lead Model: 5076
Implantable Pulse Generator Implant Date: 20190905
Lead Channel Impedance Value: 323 Ohm
Lead Channel Impedance Value: 342 Ohm
Lead Channel Impedance Value: 418 Ohm
Lead Channel Impedance Value: 437 Ohm
Lead Channel Pacing Threshold Amplitude: 0.5 V
Lead Channel Pacing Threshold Amplitude: 1 V
Lead Channel Pacing Threshold Pulse Width: 0.4 ms
Lead Channel Pacing Threshold Pulse Width: 1 ms
Lead Channel Sensing Intrinsic Amplitude: 4.375 mV
Lead Channel Sensing Intrinsic Amplitude: 6.75 mV
Lead Channel Setting Pacing Amplitude: 3.5 V
Lead Channel Setting Pacing Amplitude: 3.5 V
Lead Channel Setting Pacing Pulse Width: 1 ms
Lead Channel Setting Sensing Sensitivity: 0.9 mV

## 2018-04-30 NOTE — Progress Notes (Signed)
Wound check appointment. Steri-strips removed. Wound without redness, edema, or drainage. Incision edges approximated, slight puckering noted at left lateral incision corner, no stitch or opening noted. See photo below. Patient aware to monitor for signs/symptoms of infection. Plan for wound recheck on 05/04/18 while patient in office for Dr. Radford Pax appointment.  Normal device function. Thresholds, sensing, and impedances consistent with implant measurements. RV (His) capture appears non-selective until 1.0V @ 1.23ms, then selective until LOC at 0.25V @ 1.76ms. Device programmed at 3.5V with auto capture programmed on (RA only) for extra safety margin until 3 month visit. Histogram distribution appropriate for patient and level of activity. No mode switches or high ventricular rates noted. Patient educated about wound care, arm mobility, and lifting restrictions. Initiated setup of [facility] app, patient to complete setup at home due to not having email password. ROV with JA on 07/20/18.  Patient gave verbal permission to include the following photo:   Patient requested recommendations as to what she should try for constipation. She reports that she has had issues with constipation since starting on diltiazem and flecainide during her hospitalization. I encouraged fiber-rich foods, but patient is concerned about increasing her vitamin K intake too much as she is on warfarin. Also encouraged her to contact PCP's office for recommendations as they follow her INRs.

## 2018-05-04 ENCOUNTER — Ambulatory Visit (INDEPENDENT_AMBULATORY_CARE_PROVIDER_SITE_OTHER): Payer: Medicare Other | Admitting: *Deleted

## 2018-05-04 ENCOUNTER — Encounter: Payer: Self-pay | Admitting: Cardiology

## 2018-05-04 ENCOUNTER — Ambulatory Visit: Payer: Medicare Other | Admitting: Cardiology

## 2018-05-04 VITALS — BP 126/70 | HR 62 | Ht 62.5 in | Wt 209.4 lb

## 2018-05-04 DIAGNOSIS — I481 Persistent atrial fibrillation: Secondary | ICD-10-CM | POA: Diagnosis not present

## 2018-05-04 DIAGNOSIS — I495 Sick sinus syndrome: Secondary | ICD-10-CM

## 2018-05-04 DIAGNOSIS — I4819 Other persistent atrial fibrillation: Secondary | ICD-10-CM

## 2018-05-04 DIAGNOSIS — I10 Essential (primary) hypertension: Secondary | ICD-10-CM | POA: Diagnosis not present

## 2018-05-04 DIAGNOSIS — G4733 Obstructive sleep apnea (adult) (pediatric): Secondary | ICD-10-CM | POA: Diagnosis not present

## 2018-05-04 HISTORY — DX: Sick sinus syndrome: I49.5

## 2018-05-04 NOTE — Progress Notes (Signed)
Cardiology Office Note:    Date:  05/04/2018   ID:  Courtney Dunlap, DOB Mar 16, 1951, MRN [id number]  PCP:  Reynold Bowen, MD  Cardiologist:  Fransico Him, MD    Referring MD: Reynold Bowen, MD   Chief Complaint  Patient presents with  . Atrial Fibrillation  . Hypertension  . Sleep Apnea    History of Present Illness:    Courtney Dunlap is a 67 y.o. female with a hx of PAF in the setting of hyperthyroidism, SVTs/p ablation, HTN and OSA on CPAP.  She was recently admitted to the hospital earlier in the month with recurrent atrial for ablation with RVR and near syncope and tachybradycardia syndrome with posttermination pauses up to 5 seconds and underwent MDT dual-chamber pacemaker.  Her sotalol was stopped and she was started on flecainide and Cardizem at discharge.  She was seen back by Roderic Palau in the office post implant and was doing well.  She is here today for followup and is doing well.  She denies any chest pain or pressure, SOB, DOE, PND, orthopnea, LE edema, dizziness, palpitations or syncope. She is compliant with her meds and is tolerating meds with no SE.  She is doing well with her CPAP device and thinks that she has gotten used to it.  She tolerates the mask and feels the pressure is adequate.  Since going on CPAP she so we might end up needing feels rested in the am and has no significant daytime sleepiness.  She denies any significant mouth or nasal dryness or nasal congestion.   does not think that he snores.    Past Medical History:  Diagnosis Date  . A-fib ([place])   . Allergic dermatitis   . Anxiety   . Arthritis    back, knees & ankles   . Atrial flutter ([place]) 05/25/2016  . Benign essential HTN 02/04/2014  . Breast cancer ([place])   . Cancer ([place])    skin- lower abdomen  . Depression   . Dyslipidemia 08/16/2011  . Family history of breast cancer   . Genetic testing 05/12/2015   Negative genetic testing on the Breast/Ovarian cancer panel.  The Breast/Ovarian  gene panel offered by [facility] includes sequencing and rearrangement analysis for the following 20 genes:  ATM, BARD1, BRCA1, BRCA2, BRIP1, CDH1, CHEK2, EPCAM, FANCC, MLH1, MSH2, MSH6, NBN, PALB2, PMS2, PTEN, RAD51C, RAD51D, TP53, and XRCC2.   The report date is May 11, 2015.   [name] GERD (gastroesophageal reflux disease)   . Hiatal hernia    with GERD  . Hypercholesteremia   . Hypertension   . Hyperthyroidism    pt. reports that she thinks she has hypothyroidism, states MD- [place] took her off med. in the past 1-2 yrs.   . Malignant neoplasm of upper-outer quadrant of right breast in female, estrogen receptor positive ([place]) 04/24/2015  . Mild cognitive impairment with memory loss   . Morbid obesity ([place])   . OSA (obstructive sleep apnea) 02/04/2014  . Paroxysmal atrial fibrillation ([place]) 02/04/2014  . Sleep apnea    severe with AHI 37/hr now on CPAP at 13cm H2O  . SVT (supraventricular tachycardia) ([place])    likely due to atrial flutter.  successfullly ablated by Dr Lovena Le 10/17  . 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 ABLATION  06/2016   Dr. Delilah Shan  . APPENDECTOMY  28 years ago  . DILATION AND CURETTAGE OF UTERUS  1996    Polyp resection   . ELECTROPHYSIOLOGIC STUDY N/A 05/27/2016   Procedure: EPS/SVT Ablation;  Surgeon: Evans Lance, MD;  Location: [facility];  Service: Cardiovascular;  Laterality: N/A;  . HYSTEROSCOPY  4/12   D&C  . PACEMAKER IMPLANT N/A 04/16/2018   Procedure: PACEMAKER IMPLANT;  Surgeon: Thompson Grayer, MD;  Location: [facility];  Service: Cardiovascular;  Laterality: N/A;  . RADIOACTIVE SEED GUIDED PARTIAL MASTECTOMY WITH AXILLARY SENTINEL LYMPH NODE BIOPSY Right 05/18/2015   Procedure: RIGHT BREAST RADIOACTIVE SEED PARTIAL MASTECTOMY WITH RIGHT SENTINEL LYMPH NODE MAPPING;  Surgeon: Erroll Luna, MD;  Location: [facility];  Service: General;  Laterality: Right;  .  TONSILLECTOMY    . TUBAL LIGATION      Current Medications: Current Meds  Medication Sig  . acetaminophen (TYLENOL) 500 MG tablet Take 500 mg by mouth every 6 (six) hours as needed for pain.  [name] anastrozole (ARIMIDEX) 1 MG tablet Take 1 tablet (1 mg total) by mouth daily.  [name] atorvastatin (LIPITOR) 20 MG tablet Take 20 mg by mouth daily after breakfast.   . cholecalciferol (VITAMIN D) 1000 units tablet Take 1,000 Units by mouth daily.  [name] diltiazem (CARDIZEM CD) 120 MG 24 hr capsule Take 1 capsule (120 mg total) by mouth daily.  . flecainide (TAMBOCOR) 100 MG tablet Take 1 tablet (100 mg total) by mouth every 12 (twelve) hours.  . hydrochlorothiazide (HYDRODIURIL) 25 MG tablet Take 1 tablet (25 mg total) by mouth daily.  . Omega-3 Fatty Acids (FISH OIL) 1000 MG CAPS Take 1,000 mg by mouth daily.  . traMADol (ULTRAM) 50 MG tablet Take 1 tablet (50 mg total) by mouth every 6 (six) hours as needed for moderate pain.  . vitamin B-12 (CYANOCOBALAMIN) 1000 MCG tablet Take 1,000 mcg by mouth daily.  [name] warfarin (COUMADIN) 5 MG tablet Take 1.5 tablets (7.5 mg total) by mouth daily. Resume on 05/30/2016 (Patient taking differently: Take 5-7.5 mg by mouth See admin instructions. 2m on Mon and Thurs and 7.512mSun, Tue, Wed, Fri, Sat)     Allergies:   Codeine; Adhesive [tape]; and Latex   Social History   Socioeconomic History  . Marital status: Married    Spouse name: Not on file  . Number of children: 2  . Years of education: Not on file  . Highest education level: Not on file  Occupational History    Employer: [employer]  . Financial resource strain: Not on file  . Food insecurity:    Worry: Not on file    Inability: Not on file  . Transportation needs:    Medical: Not on file    Non-medical: Not on file  Tobacco Use  . Smoking status: Former Smoker    Years: 4.00    Types: Cigarettes  . Smokeless tobacco: Former [employer] developer  Quit date: 05/15/1974  . Tobacco  comment: quit 35-40 years ago  Substance and Sexual Activity  . Alcohol use: No    Alcohol/week: 3.0 standard drinks    Types: 3 Standard drinks or equivalent per week  . Drug use: No  . Sexual activity: Not Currently    Birth control/protection: Post-menopausal  Lifestyle  . Physical activity:    Days per week: Not on file    Minutes per session: Not on file  . Stress: Not on file  Relationships  . Social connections:    Talks on phone: Not on file  Gets together: Not on file    Attends religious service: Not on file    Active member of club or organization: Not on file    Attends meetings of clubs or organizations: Not on file    Relationship status: Not on file  Other Topics Concern  . Not on file  Social History Narrative  . Not on file     Family History: The patient's family history includes Brain cancer in her mother; Breast cancer in her paternal aunt and paternal aunt; Diabetes in her maternal grandmother; Heart attack in her maternal grandmother; Hypertension in her brother, mother, sister, and sister; Melanoma in her paternal aunt; Multiple myeloma in her paternal aunt; Other (age of onset: 46) in her daughter; Thyroid disease in her brother and sister.  ROS:   Please see the history of present illness.    ROS  All other systems reviewed and negative.   EKGs/Labs/Other Studies Reviewed:    The following studies were reviewed today: PAP download  EKG:  My note is not ordered today.    Recent Labs: 08/24/2017: ALT 22; TSH 1.153 04/15/2018: BUN 10; Creatinine, Ser 0.72; Hemoglobin 15.3; Magnesium 2.1; Platelets 236; Potassium 4.0; Sodium 137   Recent Lipid Panel No results found for: CHOL, TRIG, HDL, CHOLHDL, VLDL, LDLCALC, LDLDIRECT  Physical Exam:    VS:  BP 126/70   Pulse 62   Ht 5' 2.5" (1.588 m)   Wt 209 lb 6.4 oz (95 kg)   LMP 11/21/2010 Comment: spotting-had hysteroscopy/d&c  SpO2 98%   BMI 37.69 kg/m     Wt Readings from Last 3 Encounters:    05/04/18 209 lb 6.4 oz (95 kg)  04/17/18 206 lb 8 oz (93.7 kg)  03/17/18 214 lb 3.2 oz (97.2 kg)     GEN:  Well nourished, well developed in no acute distress HEENT: Normal NECK: No JVD; No carotid bruits LYMPHATICS: No lymphadenopathy CARDIAC: RRR, no murmurs, rubs, gallops RESPIRATORY:  Clear to auscultation without rales, wheezing or rhonchi  ABDOMEN: Soft, non-tender, non-distended MUSCULOSKELETAL:  No edema; No deformity  SKIN: Warm and dry NEUROLOGIC:  Alert and oriented x 3 PSYCHIATRIC:  Normal affect   ASSESSMENT:    1. Persistent atrial fibrillation ([place])   2. Benign essential HTN   3. OSA (obstructive sleep apnea)   4. Morbid obesity ([place])   5. Tachycardia-bradycardia syndrome ([place])    PLAN:    In order of problems listed above:  1.  Persistent atrial fibrillation - recently admitted with atrial fibrillation with RVR with posttermination pauses underwent permanent pacemaker placement.  Sotalol was stopped and she is now on flecainide 50 mg twice daily along with diltiazem.  She has not had any further palpitations.  She will continue on flecainide 100 mg twice daily and Cardizem CD 120 mg daily.  She will also continue on warfarin followed in our Coumadin clinic.  2.  Hypertension -BP is well controlled on exam today.  She will continue on Cardizem CD 120 mg daily.  3  OSA - the patient is tolerating PAP therapy well without any problems. The PAP download was reviewed today and showed an AHI of 1.5/hr on auto CPAP  with 60% compliance in using more than 4 hours nightly.  The patient has been using and benefiting from PAP use and will continue to benefit from therapy.  I encouraged her to be more compliant with her CPAP device which will help to reduce the recurrence of atrial fibrillation.  4.  Morbid obesity - I have encouraged her to get into a routine exercise program and cut back on carbs and portions.   5.  Tachybradycardia syndrome status post MDT pacemaker  followed in device clinic.   Medication Adjustments/Labs and Tests Ordered: Current medicines are reviewed at length with the patient today.  Concerns regarding medicines are outlined above.  No orders of the defined types were placed in this encounter.  No orders of the defined types were placed in this encounter.   Signed, Fransico Him, MD  05/04/2018 9:34 AM    [place]

## 2018-05-04 NOTE — Progress Notes (Signed)
Wound re-check, incision edges approximated, a small amount of dead skin  Removed from lateral left incision site, site well healed underneath. No redness or edema noted. Attempted to help pt set up [place] pt did not know apple ID or password was unable to set up for pt. [facility] pamphlet with instructions given to pt for her daughter to help set up. Informed pt that if she continued to have problem with the app there was another home monitor we could send her.

## 2018-05-04 NOTE — Patient Instructions (Signed)
Medication Instructions:  Your physician recommends that you continue on your current medications as directed. Please refer to the Current Medication list given to you today.  Follow-Up: Your physician wants you to follow-up in: 6 months with Dr. Radford Pax. You will receive a reminder letter in the mail two months in advance. If you don't receive a letter, please call our office to schedule the follow-up appointment.  If you need a refill on your cardiac medications before your next appointment, please call your pharmacy.

## 2018-06-08 NOTE — Progress Notes (Signed)
[place]  [phone]) [phone] [phone]) [phone]     ID: Courtney Dunlap DOB: October 04, 1950  MR#: [id number]  QQV#:[id number]  Patient Care Team: Reynold Bowen, MD as PCP - General (Endocrinology) Sueanne Margarita, MD as PCP - Cardiology (Cardiology) Wenda Low, MD as Consulting Physician (Internal Medicine) Sueanne Margarita, MD as Consulting Physician (Cardiology) Erroll Luna, MD as Consulting Physician (General Surgery) Magrinat, Virgie Dad, MD as Consulting Physician (Oncology) Eppie Gibson, MD as Attending Physician (Radiation Oncology) Rockwell Germany, RN as Registered Nurse Mauro Kaufmann, RN as Registered Nurse Megan Salon, MD as Consulting Physician (Gynecology) Sylvan Cheese, NP as Nurse Practitioner (Nurse Practitioner) PCP: Reynold Bowen, MD OTHER MD: Christene Slates M.D.  CHIEF COMPLAINT: Estrogen receptor positive breast cancer  CURRENT TREATMENT: anastrozole   BREAST CANCER HISTORY: From the original intake note:  Courtney Dunlap had routine screening mammography at [facility] 04/11/2015 showing a new irregular lesion in the right breast. Diagnostic right unilateral mammogram and ultrasound 04/13/2015 found the breast density to be category B. In the right breast at the 9:00 position there was a mass with indistinct margins associated with architectural distortion. By ultrasound this was a 1 cm. Ultrasound of the axilla showed no abnormality.  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  The patient's subsequent history is as detailed below.   INTERVAL HISTORY: Courtney Dunlap returns today for follow-up of her estrogen receptor positive breast cancer. She continues on  anastrozole, with good tolerance overall. She reports hot flashes and denies vaginal dryness.  Per patient report, she is scheduled for routine mammography in December 2019.    REVIEW OF SYSTEMS: Since her last visit, Courtney Dunlap underwent pacemaker placement on 04/16/2018.  Prior to her pacemaker surgery, she states she was continuing with her water exercises at the [facility]  It was after 1 of those sessions when she felt faint, noticed palpitations, and went to the ED leading to the pacemaker.  He reports left leg swelling and general "aches and pains." She is now walking with a cane.The patient denies unusual headaches, visual changes, nausea, vomiting, stiff neck, dizziness, or gait imbalance. There has been no cough, phlegm production, or pleurisy, no chest pain or pressure, and no change in bowel or bladder habits. The patient denies fever, rash, bleeding, unexplained fatigue or unexplained weight loss. A detailed review of systems was otherwise entirely negative.   PAST MEDICAL HISTORY: Past Medical History:  Diagnosis Date  . A-fib ([place])   . Allergic dermatitis   . Anxiety   . Arthritis    back, knees & ankles   . Atrial flutter ([place]) 05/25/2016  . Benign essential HTN 02/04/2014  . Breast cancer ([place])   . Cancer ([place])    skin- lower abdomen  . Depression   . Dyslipidemia 08/16/2011  . Family history of breast cancer   . Genetic testing 05/12/2015   Negative genetic testing on the Breast/Ovarian cancer panel.  The Breast/Ovarian gene panel offered by [facility] includes sequencing and rearrangement analysis for the following 20 genes:  ATM, BARD1, BRCA1, BRCA2, BRIP1, CDH1, CHEK2, EPCAM, FANCC, MLH1, MSH2, MSH6, NBN, PALB2, PMS2, PTEN, RAD51C, RAD51D, TP53, and XRCC2.   The report date is May 11, 2015.   [name] GERD (gastroesophageal  reflux disease)   . Hiatal hernia    with GERD  . Hypercholesteremia   . Hypertension   . Hyperthyroidism    pt. reports that she thinks she has hypothyroidism,  states MD- [place] took her off med. in the past 1-2 yrs.   . Malignant neoplasm of upper-outer quadrant of right breast in female, estrogen receptor positive ([place]) 04/24/2015  . Mild cognitive impairment with memory loss   . Morbid obesity ([place])   . OSA (obstructive sleep apnea) 02/04/2014  . Paroxysmal atrial fibrillation ([place]) 02/04/2014  . Sleep apnea    severe with AHI 37/hr now on CPAP at 13cm H2O  . SVT (supraventricular tachycardia) ([place])    likely due to atrial flutter.  successfullly ablated by Dr Lovena Le 10/17  . 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 ABLATION  06/2016   Dr. Delilah Shan  . APPENDECTOMY  28 years ago  . DILATION AND CURETTAGE OF UTERUS  1996    Polyp resection   . ELECTROPHYSIOLOGIC STUDY N/A 05/27/2016   Procedure: EPS/SVT Ablation;  Surgeon: Evans Lance, MD;  Location: [facility];  Service: Cardiovascular;  Laterality: N/A;  . HYSTEROSCOPY  4/12   D&C  . PACEMAKER IMPLANT N/A 04/16/2018   Procedure: PACEMAKER IMPLANT;  Surgeon: Thompson Grayer, MD;  Location: [facility];  Service: Cardiovascular;  Laterality: N/A;  . RADIOACTIVE SEED GUIDED PARTIAL MASTECTOMY WITH AXILLARY SENTINEL LYMPH NODE BIOPSY Right 05/18/2015   Procedure: RIGHT BREAST RADIOACTIVE SEED PARTIAL MASTECTOMY WITH RIGHT SENTINEL LYMPH NODE MAPPING;  Surgeon: Erroll Luna, MD;  Location: [facility];  Service: General;  Laterality: Right;  . TONSILLECTOMY    . TUBAL LIGATION      FAMILY HISTORY Family History  Problem Relation Age of Onset  . Hypertension Mother   . Brain cancer Mother   . Hypertension Sister   . Thyroid disease Sister   . Hypertension Sister   . Hypertension Brother   . Thyroid disease Brother   . Diabetes Maternal Grandmother   . Heart attack Maternal Grandmother   . Breast cancer Paternal Aunt        dx in her late 49s- early 72s  . Melanoma Paternal  Aunt   . Breast cancer Paternal Aunt   . Multiple myeloma Paternal Aunt   . Other Daughter 45       ITP   the patient's father was diagnosed with squamous cell cancer of the throat at age 65 and died 55 years later. The patient's mother died from end-stage renal disease at age 22. The patient had one brother, 2 sisters. There is no cancer in the immediate family except as noted, but one of the patient's father's 5 sisters was diagnosed with breast cancer before the age of 108. Another 1 had multiple myeloma. On the patient's mother sides there is a history of brain tumor in a niece.   GYNECOLOGIC HISTORY:  Patient's last menstrual period was 11/21/2010. Menarche age 16, first live birth age 36. The patient is GX P2. She went through menopause in 2002 or thereabouts. She did not take hormone replacement. 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 Her husband [name] is a retired Clinical biochemist. Daughter Elsbeth "Sharyn Lull" [place] lives in [place] and  is a housewife. Daughter "Courtney Dunlap" and Dunlap this in [place]. 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 The patient has 2 grandchildren and 3 great-grandchildren. She is a Psychologist, forensic.    ADVANCED DIRECTIVES: Not in place   HEALTH MAINTENANCE: Social History   Tobacco Use  . Smoking status: Former Smoker    Years: 4.00    Types: Cigarettes  . Smokeless tobacco: Former Systems developer    Quit date: 05/15/1974  . Tobacco comment: quit 35-40 years ago  Substance Use Topics  . Alcohol use: No    Alcohol/week: 3.0 standard drinks    Types: 3 Standard drinks or equivalent per week  . Drug use: No     Colonoscopy: ?1997  PAP: FEB 2016  Bone density: October 2016 at [facility], normal  Lipid panel:  Allergies  Allergen Reactions  . Codeine Swelling    Facial/lips    . Adhesive [Tape] Itching and Rash    MUST USE PAPER TAPE-CAUSES BLISTERS AND BRUISES ALSO  . Latex Itching and Rash    Current Outpatient Medications  Medication Sig Dispense Refill  . acetaminophen (TYLENOL) 500 MG tablet Take 500 mg by mouth every 6 (six) hours as needed for pain.    [name] atorvastatin (LIPITOR) 20 MG tablet Take 20 mg by mouth daily after breakfast.     . cholecalciferol (VITAMIN D) 1000 units tablet Take 1,000 Units by mouth daily.    [name] diltiazem (CARDIZEM CD) 120 MG 24 hr capsule Take 1 capsule (120 mg total) by mouth daily. 90 capsule 1  . flecainide (TAMBOCOR) 100 MG tablet Take 1 tablet (100 mg total) by mouth every 12 (twelve) hours. 180 tablet 1  . hydrochlorothiazide (HYDRODIURIL) 25 MG tablet Take 1 tablet (25 mg total) by mouth daily. 90 tablet 3  . Omega-3 Fatty Acids (FISH OIL) 1000 MG CAPS Take 1,000 mg by mouth daily.    . traMADol (ULTRAM) 50 MG tablet Take 1 tablet (50 mg total) by mouth every 6 (six) hours as needed for moderate pain. 30 tablet 0  . vitamin B-12 (CYANOCOBALAMIN) 1000 MCG tablet Take 1,000 mcg by mouth daily.    [name] warfarin (COUMADIN) 5 MG tablet Take 1.5 tablets (7.5 mg total) by mouth daily. Resume on 05/30/2016 (Patient taking differently: Take 5-7.5 mg by mouth See admin instructions. 25m on Mon and Thurs and 7.530mSun, Tue, Wed, Fri, Sat)     No current facility-administered medications for this visit.     OBJECTIVE: Middle-aged white woman walking with a cane  Vitals:   06/09/18 0909  BP: (!) 137/47  Pulse: 60  Resp: 18  Temp: 98.4 F (36.9 C)  SpO2: 98%     Body mass index is 38.27 kg/m.    ECOG FS:1 - Symptomatic but completely ambulatory  Sclerae unicteric, EOMs intact Oropharynx clear and moist No cervical or supraclavicular adenopathy Lungs no rales or rhonchi Heart regular rate and rhythm Abd soft, nontender, positive bowel sounds MSK no focal spinal tenderness, no upper extremity lymphedema Neuro: nonfocal, well  oriented, appropriate affect Breasts: The right breast is status post lumpectomy and radiation.  There is significant skin coarsening as previously noted but there is no evidence of disease recurrence.  The left breast is unremarkable.  Both axillae are benign.  LAB RESULTS:  CMP     Component Value Date/Time   NA 142 06/09/2018 0845   NA 137 02/19/2018 1350   NA 140 06/09/2017 1341   K 3.8 06/09/2018 0845  K 3.8 06/09/2017 1341   CL 101 06/09/2018 0845   CO2 33 (H) 06/09/2018 0845   CO2 28 06/09/2017 1341   GLUCOSE 111 (H) 06/09/2018 0845   GLUCOSE 94 06/09/2017 1341   BUN 14 06/09/2018 0845   BUN 12 02/19/2018 1350   BUN 13.9 06/09/2017 1341   CREATININE 0.87 06/09/2018 0845   CREATININE 0.7 06/09/2017 1341   CALCIUM 9.8 06/09/2018 0845   CALCIUM 9.7 06/09/2017 1341   PROT 7.3 06/09/2018 0845   PROT 7.6 06/09/2017 1341   ALBUMIN 3.6 06/09/2018 0845   ALBUMIN 3.8 06/09/2017 1341   AST 21 06/09/2018 0845   AST 27 06/09/2017 1341   ALT 23 06/09/2018 0845   ALT 34 06/09/2017 1341   ALKPHOS 151 (H) 06/09/2018 0845   ALKPHOS 157 (H) 06/09/2017 1341   BILITOT 0.8 06/09/2018 0845   BILITOT 0.69 06/09/2017 1341   GFRNONAA >60 06/09/2018 0845   GFRAA >60 06/09/2018 0845    INo results found for: SPEP, UPEP  Lab Results  Component Value Date   WBC 6.0 06/09/2018   NEUTROABS 3.8 06/09/2018   HGB 13.3 06/09/2018   HCT 39.9 06/09/2018   MCV 93.2 06/09/2018   PLT 199 06/09/2018      Chemistry      Component Value Date/Time   NA 142 06/09/2018 0845   NA 137 02/19/2018 1350   NA 140 06/09/2017 1341   K 3.8 06/09/2018 0845   K 3.8 06/09/2017 1341   CL 101 06/09/2018 0845   CO2 33 (H) 06/09/2018 0845   CO2 28 06/09/2017 1341   BUN 14 06/09/2018 0845   BUN 12 02/19/2018 1350   BUN 13.9 06/09/2017 1341   CREATININE 0.87 06/09/2018 0845   CREATININE 0.7 06/09/2017 1341      Component Value Date/Time   CALCIUM 9.8 06/09/2018 0845   CALCIUM 9.7 06/09/2017 1341    ALKPHOS 151 (H) 06/09/2018 0845   ALKPHOS 157 (H) 06/09/2017 1341   AST 21 06/09/2018 0845   AST 27 06/09/2017 1341   ALT 23 06/09/2018 0845   ALT 34 06/09/2017 1341   BILITOT 0.8 06/09/2018 0845   BILITOT 0.69 06/09/2017 1341       No results found for: LABCA2  No components found for: LABCA125  No results for input(s): INR in the last 168 hours.  Urinalysis    Component Value Date/Time   COLORURINE YELLOW 05/25/2016 [place] 05/25/2016 1645   LABSPEC 1.008 05/25/2016 1645   PHURINE 7.5 05/25/2016 1645   GLUCOSEU NEGATIVE 05/25/2016 1645   HGBUR NEGATIVE 05/25/2016 [place] 12/26/2016 1108   [place] 05/25/2016 [place] 12/26/2016 1108   PROTEINUR NEGATIVE 05/25/2016 1645   UROBILINOGEN negative (A) 12/26/2016 1108   NITRITE N 12/26/2016 1108   NITRITE NEGATIVE 05/25/2016 1645   LEUKOCYTESUR Trace (A) 12/26/2016 1108    STUDIES: She has had a diagnostic bilateral mammogram with CAD on 04/24/2017 completed at [facility] with results showing: Breast density B. Benign. The 3 cm mass in the right breast is consistent with a resolving seroma/hematoma postop and is benign. There is no mammographic evidence of malignancy.  ASSESSMENT: 67 y.o. [place] woman, status post right breast upper outer quadrant biopsy 04/18/2015 for a clinical T1b N0, stage IA invasive ductal carcinoma, grade 2, strongly estrogen and progesterone receptor positive, HER-2 not amplified, with an MIB-1 of 3%  (1) status post right lumpectomy and sentinel lymph node sampling 05/18/2015 for  a  pT1c pN0, stage IA invasive ductal carcinoma, grade 1, repeat HER-2 again negative  (2) Oncotype DX score of 11 predicts an outside the breast recurrence within 10 years of 7% if the patient's only systemic therapy is tamoxifen for 5 years. It also predicts no benefit from chemotherapy  (3) adjuvant radiation completed December 2017 thank you  (4) genetics testing  05/11/2015 through the Breast/Ovarian gene panel offered by [facility] found no deleterious mutations in ATM, BARD1, BRCA1, BRCA2, BRIP1, CDH1, CHEK2, EPCAM, FANCC, MLH1, MSH2, MSH6, NBN, PALB2, PMS2, PTEN, RAD51C, RAD51D, TP53, and XRCC2.  (5) started anastrozole 08/12/2014  (a) DEXA scan at [facility] 05/30/2015 showed a T score of 1.0 (normal)  (b) anastrozole hed Jul;y 2017 due to arthralgias/ myalgias  (c) anastrozole resumed October 2017 as there was no change in symptoms off the drug  PLAN: Serafina is now 3 years out from definitive surgery for her breast cancer with no evidence of disease recurrence.  This is very favorable.  She is tolerating anastrozole well.  The plan will be to continue that one more year through December 2020.  She is a bit behind on her health maintenance, with her mammogram due in December and I have gone ahead and added a bone density to be done at the same time  Otherwise she will see me next November.  She knows to call for any problems that may develop before that visit. Magrinat, Virgie Dad, MD  06/09/18 9:34 AM Medical Oncology and Hematology [facility] [address] [place], [place] [address] Tel. [phone]    Fax. [phone]  This document serves as a record of services personally performed by Lurline Del, MD. It was created on her behalf by Wilburn Mylar, a trained medical scribe. The creation of this record is based on the scribe's personal observations and the provider's statements to them. This document has been checked and approved by the attending provider.  I, Lurline Del MD, have reviewed the above documentation for accuracy and completeness, and I agree with the above.

## 2018-06-09 ENCOUNTER — Inpatient Hospital Stay: Payer: Medicare Other

## 2018-06-09 ENCOUNTER — Telehealth: Payer: Self-pay | Admitting: Oncology

## 2018-06-09 ENCOUNTER — Inpatient Hospital Stay: Payer: Medicare Other | Attending: Oncology | Admitting: Oncology

## 2018-06-09 VITALS — BP 137/47 | HR 60 | Temp 98.4°F | Resp 18 | Ht 62.5 in | Wt 212.6 lb

## 2018-06-09 DIAGNOSIS — Z79811 Long term (current) use of aromatase inhibitors: Secondary | ICD-10-CM | POA: Insufficient documentation

## 2018-06-09 DIAGNOSIS — I10 Essential (primary) hypertension: Secondary | ICD-10-CM | POA: Insufficient documentation

## 2018-06-09 DIAGNOSIS — Z170 Estrogen receptor positive status [ER+]: Principal | ICD-10-CM

## 2018-06-09 DIAGNOSIS — C50411 Malignant neoplasm of upper-outer quadrant of right female breast: Secondary | ICD-10-CM

## 2018-06-09 LAB — COMPREHENSIVE METABOLIC PANEL
ALBUMIN: 3.6 g/dL (ref 3.5–5.0)
ALT: 23 U/L (ref 0–44)
ANION GAP: 8 (ref 5–15)
AST: 21 U/L (ref 15–41)
Alkaline Phosphatase: 151 U/L — ABNORMAL HIGH (ref 38–126)
BUN: 14 mg/dL (ref 8–23)
CO2: 33 mmol/L — ABNORMAL HIGH (ref 22–32)
CREATININE: 0.87 mg/dL (ref 0.44–1.00)
Calcium: 9.8 mg/dL (ref 8.9–10.3)
Chloride: 101 mmol/L (ref 98–111)
Glucose, Bld: 111 mg/dL — ABNORMAL HIGH (ref 70–99)
POTASSIUM: 3.8 mmol/L (ref 3.5–5.1)
Sodium: 142 mmol/L (ref 135–145)
Total Bilirubin: 0.8 mg/dL (ref 0.3–1.2)
Total Protein: 7.3 g/dL (ref 6.5–8.1)

## 2018-06-09 LAB — CBC WITH DIFFERENTIAL/PLATELET
Abs Immature Granulocytes: 0.01 10*3/uL (ref 0.00–0.07)
BASOS ABS: 0 10*3/uL (ref 0.0–0.1)
BASOS PCT: 1 %
EOS ABS: 0.2 10*3/uL (ref 0.0–0.5)
EOS PCT: 3 %
HEMATOCRIT: 39.9 % (ref 36.0–46.0)
Hemoglobin: 13.3 g/dL (ref 12.0–15.0)
Immature Granulocytes: 0 %
LYMPHS ABS: 1.3 10*3/uL (ref 0.7–4.0)
Lymphocytes Relative: 21 %
MCH: 31.1 pg (ref 26.0–34.0)
MCHC: 33.3 g/dL (ref 30.0–36.0)
MCV: 93.2 fL (ref 80.0–100.0)
Monocytes Absolute: 0.8 10*3/uL (ref 0.1–1.0)
Monocytes Relative: 13 %
NEUTROS PCT: 62 %
NRBC: 0 % (ref 0.0–0.2)
Neutro Abs: 3.8 10*3/uL (ref 1.7–7.7)
PLATELETS: 199 10*3/uL (ref 150–400)
RBC: 4.28 MIL/uL (ref 3.87–5.11)
RDW: 12.4 % (ref 11.5–15.5)
WBC: 6 10*3/uL (ref 4.0–10.5)

## 2018-06-09 NOTE — Telephone Encounter (Signed)
Gave avs  ° °

## 2018-07-20 ENCOUNTER — Encounter: Payer: Self-pay | Admitting: Internal Medicine

## 2018-07-20 ENCOUNTER — Ambulatory Visit (INDEPENDENT_AMBULATORY_CARE_PROVIDER_SITE_OTHER): Payer: Medicare Other | Admitting: Internal Medicine

## 2018-07-20 VITALS — BP 128/80 | HR 74 | Ht 62.5 in | Wt 217.2 lb

## 2018-07-20 DIAGNOSIS — Z950 Presence of cardiac pacemaker: Secondary | ICD-10-CM

## 2018-07-20 DIAGNOSIS — I480 Paroxysmal atrial fibrillation: Secondary | ICD-10-CM | POA: Diagnosis not present

## 2018-07-20 DIAGNOSIS — G4733 Obstructive sleep apnea (adult) (pediatric): Secondary | ICD-10-CM

## 2018-07-20 DIAGNOSIS — I10 Essential (primary) hypertension: Secondary | ICD-10-CM

## 2018-07-20 DIAGNOSIS — I495 Sick sinus syndrome: Secondary | ICD-10-CM

## 2018-07-20 NOTE — Patient Instructions (Signed)
Medication Instructions:  Your physician recommends that you continue on your current medications as directed. Please refer to the Current Medication list given to you today.  Labwork: None ordered.  Testing/Procedures: None ordered.  Follow-Up: Your physician recommends that you schedule a follow-up appointment in:   6 months with Chanetta Marshall, NP  Remote monitoring is used to monitor your Pacemaker from home. This monitoring reduces the number of office visits required to check your device to one time per year. It allows [place] to keep an eye on the functioning of your device to ensure it is working properly. You are scheduled for a device check from home on 3/9. You may send your transmission at any time that day. If you have a wireless device, the transmission will be sent automatically. After your physician reviews your transmission, you will receive a postcard with your next transmission date.    Any Other Special Instructions Will Be Listed Below (If Applicable).     If you need a refill on your cardiac medications before your next appointment, please call your pharmacy.

## 2018-07-20 NOTE — Progress Notes (Signed)
PCP: Reynold Bowen, MD Primary Cardiologist: Dr Radford Pax Primary EP:  Dr Rayann Heman  Courtney Dunlap is a 67 y.o. female who presents today for routine electrophysiology followup.  Since her recent pacemaker implant, the patient reports doing very well.  No afib since starting flecainide.  Today, she denies symptoms of palpitations, chest pain, shortness of breath,  lower extremity edema, dizziness, presyncope, or syncope.  The patient is otherwise without complaint today.   Past Medical History:  Diagnosis Date  . A-fib ([place])   . Allergic dermatitis   . Anxiety   . Arthritis    back, knees & ankles   . Atrial flutter ([place]) 05/25/2016  . Benign essential HTN 02/04/2014  . Breast cancer ([place])   . Cancer ([place])    skin- lower abdomen  . Depression   . Dyslipidemia 08/16/2011  . Family history of breast cancer   . Genetic testing 05/12/2015   Negative genetic testing on the Breast/Ovarian cancer panel.  The Breast/Ovarian gene panel offered by [facility] includes sequencing and rearrangement analysis for the following 20 genes:  ATM, BARD1, BRCA1, BRCA2, BRIP1, CDH1, CHEK2, EPCAM, FANCC, MLH1, MSH2, MSH6, NBN, PALB2, PMS2, PTEN, RAD51C, RAD51D, TP53, and XRCC2.   The report date is May 11, 2015.   [name] GERD (gastroesophageal reflux disease)   . Hiatal hernia    with GERD  . Hypercholesteremia   . Hypertension   . Hyperthyroidism    pt. reports that she thinks she has hypothyroidism, states MD- [place] took her off med. in the past 1-2 yrs.   . Malignant neoplasm of upper-outer quadrant of right breast in female, estrogen receptor positive ([place]) 04/24/2015  . Mild cognitive impairment with memory loss   . Morbid obesity ([place])   . OSA (obstructive sleep apnea) 02/04/2014  . Paroxysmal atrial fibrillation ([place]) 02/04/2014  . Sleep apnea    severe with AHI 37/hr now on CPAP at 13cm H2O  . SVT (supraventricular tachycardia) ([place])    likely due to atrial flutter.  successfullly ablated by Dr  Lovena Le 10/17  . 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 ABLATION  06/2016   Dr. Delilah Shan  . APPENDECTOMY  28 years ago  . DILATION AND CURETTAGE OF UTERUS  1996    Polyp resection   . ELECTROPHYSIOLOGIC STUDY N/A 05/27/2016   Procedure: EPS/SVT Ablation;  Surgeon: Evans Lance, MD;  Location: [facility];  Service: Cardiovascular;  Laterality: N/A;  . HYSTEROSCOPY  4/12   D&C  . PACEMAKER IMPLANT N/A 04/16/2018   Medtronic Azure XT DR MRI SureScan PPM implanted by Dr Rayann Heman for afib with post termination pauses/ sick sinus syndrome  . RADIOACTIVE SEED GUIDED PARTIAL MASTECTOMY WITH AXILLARY SENTINEL LYMPH NODE BIOPSY Right 05/18/2015   Procedure: RIGHT BREAST RADIOACTIVE SEED PARTIAL MASTECTOMY WITH RIGHT SENTINEL LYMPH NODE MAPPING;  Surgeon: Erroll Luna, MD;  Location: [facility];  Service: General;  Laterality: Right;  . TONSILLECTOMY    . TUBAL LIGATION      ROS- all systems are reviewed and negative except as per HPI above  Current Outpatient Medications  Medication Sig Dispense Refill  . acetaminophen (TYLENOL) 500 MG tablet Take 500 mg by mouth every 6 (six) hours as needed for pain.    [name] anastrozole (ARIMIDEX) 1 MG tablet Take 1 mg by mouth daily.  4  . atorvastatin (LIPITOR) 20 MG tablet Take 20 mg by mouth  daily after breakfast.     . cholecalciferol (VITAMIN D) 1000 units tablet Take 1,000 Units by mouth daily.    [name] diltiazem (CARDIZEM CD) 120 MG 24 hr capsule Take 1 capsule (120 mg total) by mouth daily. 90 capsule 1  . flecainide (TAMBOCOR) 100 MG tablet Take 1 tablet (100 mg total) by mouth every 12 (twelve) hours. 180 tablet 1  . hydrochlorothiazide (HYDRODIURIL) 25 MG tablet Take 1 tablet (25 mg total) by mouth daily. 90 tablet 3  . Omega-3 Fatty Acids (FISH OIL) 1000 MG CAPS Take 1,000 mg by mouth daily.    . traMADol (ULTRAM) 50 MG tablet Take 1 tablet (50 mg total) by mouth every 6  (six) hours as needed for moderate pain. 30 tablet 0  . vitamin B-12 (CYANOCOBALAMIN) 1000 MCG tablet Take 1,000 mcg by mouth daily.    [name] warfarin (COUMADIN) 5 MG tablet Take 1.5 tablets (7.5 mg total) by mouth daily. Resume on 05/30/2016 (Patient taking differently: Take 5-7.5 mg by mouth See admin instructions. 64m on Mon and Thurs and 7.523mSun, Tue, Wed, Fri, Sat)     No current facility-administered medications for this visit.     Physical Exam: Vitals:   07/20/18 1104  BP: 128/80  Pulse: 74  SpO2: 94%  Weight: 217 lb 3.2 oz (98.5 kg)  Height: 5' 2.5" (1.588 m)     GEN- The patient is well appearing, alert and oriented x 3 today.   Head- normocephalic, atraumatic Eyes-  Sclera clear, conjunctiva pink Ears- hearing intact Oropharynx- clear Lungs- Clear to ausculation bilaterally, normal work of breathing Chest- pacemaker pocket is well healed Heart- Regular rate and rhythm, no murmurs, rubs or gallops, PMI not laterally displaced GI- soft, NT, ND, + BS Extremities- no clubbing, cyanosis, or edema  Pacemaker interrogation- reviewed in detail today,  See PACEART report  ekg tracing ordered today is personally reviewed and shows sinus rhythm 74 bpm, PR 160 msec, QRS 102 msec, QTc 455 msec  Assessment and Plan:  1. Symptomatic sinus bradycardia  Normal pacemaker function See Pace Art report No changes today  2. Paroxysmal atrial fibrillation afib burden is 0 % chads2vasc score is at least 3.  She is on coumadin for stroke prevention Doing well with recently started flecainide (switched from sotalol due to increased afib)  3. OSA Compliance with CPAP advised  4. Obesity Body mass index is 39.09 kg/m. Lifestyle modification encouraged  5. HTN Stable No change required today  [facility] Return to see EP NP in 6 months  JaThompson GrayerD, FAAdvanthealth [facility]2/04/2018 11:16 AM

## 2018-07-29 ENCOUNTER — Encounter: Payer: Self-pay | Admitting: Obstetrics & Gynecology

## 2018-07-31 LAB — CUP PACEART INCLINIC DEVICE CHECK
Date Time Interrogation Session: 20191220141527
Implantable Lead Implant Date: 20190905
Implantable Lead Location: 753859
Implantable Lead Model: 3830
Implantable Lead Model: 5076
Implantable Pulse Generator Implant Date: 20190905
MDC IDC LEAD IMPLANT DT: 20190905
MDC IDC LEAD LOCATION: 753860

## 2018-10-03 ENCOUNTER — Other Ambulatory Visit (HOSPITAL_COMMUNITY): Payer: Self-pay | Admitting: Nurse Practitioner

## 2018-10-09 ENCOUNTER — Other Ambulatory Visit (HOSPITAL_COMMUNITY): Payer: Self-pay | Admitting: Nurse Practitioner

## 2018-10-15 ENCOUNTER — Ambulatory Visit (INDEPENDENT_AMBULATORY_CARE_PROVIDER_SITE_OTHER): Payer: Medicare Other | Admitting: *Deleted

## 2018-10-15 DIAGNOSIS — I495 Sick sinus syndrome: Secondary | ICD-10-CM | POA: Diagnosis not present

## 2018-10-16 ENCOUNTER — Telehealth: Payer: Self-pay

## 2018-10-16 LAB — CUP PACEART REMOTE DEVICE CHECK
Battery Remaining Longevity: 175 mo
Battery Voltage: 3.17 V
Brady Statistic AP VP Percent: 0.01 %
Brady Statistic AP VS Percent: 18.07 %
Brady Statistic AS VP Percent: 0 %
Brady Statistic AS VS Percent: 81.92 %
Brady Statistic RA Percent Paced: 18.07 %
Brady Statistic RV Percent Paced: 0.01 %
Date Time Interrogation Session: 20200306135818
Implantable Lead Implant Date: 20190905
Implantable Lead Implant Date: 20190905
Implantable Lead Location: 753859
Implantable Lead Location: 753860
Implantable Lead Model: 3830
Implantable Lead Model: 5076
Implantable Pulse Generator Implant Date: 20190905
Lead Channel Impedance Value: 285 Ohm
Lead Channel Impedance Value: 342 Ohm
Lead Channel Impedance Value: 380 Ohm
Lead Channel Impedance Value: 399 Ohm
Lead Channel Pacing Threshold Amplitude: 0.75 V
Lead Channel Pacing Threshold Pulse Width: 0.4 ms
Lead Channel Sensing Intrinsic Amplitude: 1.25 mV
Lead Channel Sensing Intrinsic Amplitude: 1.25 mV
Lead Channel Sensing Intrinsic Amplitude: 5.75 mV
Lead Channel Sensing Intrinsic Amplitude: 5.75 mV
Lead Channel Setting Pacing Amplitude: 1.5 V
Lead Channel Setting Pacing Amplitude: 3.5 V
Lead Channel Setting Pacing Pulse Width: 1 ms
Lead Channel Setting Sensing Sensitivity: 0.9 mV

## 2018-10-16 NOTE — Telephone Encounter (Signed)
Left message for patient to remind of missed remote transmission.  

## 2018-10-22 ENCOUNTER — Other Ambulatory Visit: Payer: Self-pay | Admitting: Cardiology

## 2018-10-23 NOTE — Progress Notes (Signed)
Remote pacemaker transmission.   

## 2018-10-26 ENCOUNTER — Encounter: Payer: Self-pay | Admitting: Cardiology

## 2018-11-10 ENCOUNTER — Telehealth: Payer: Self-pay

## 2018-11-10 NOTE — Telephone Encounter (Signed)
Called and spoke to patient about upcoming appointment. Patient stated she can not do video visit and would like to reschedule. Pt. Stated she is not having any symptoms and is doing fine. I explained to call office if symptoms change/develop.

## 2018-11-11 ENCOUNTER — Ambulatory Visit: Payer: Medicare Other | Admitting: Cardiology

## 2019-01-14 ENCOUNTER — Encounter: Payer: Medicare Other | Admitting: *Deleted

## 2019-01-15 ENCOUNTER — Telehealth: Payer: Self-pay

## 2019-01-15 NOTE — Telephone Encounter (Signed)
Left message for patient to remind of missed remote transmission.  

## 2019-01-28 ENCOUNTER — Ambulatory Visit (INDEPENDENT_AMBULATORY_CARE_PROVIDER_SITE_OTHER): Payer: Medicare Other | Admitting: *Deleted

## 2019-01-28 DIAGNOSIS — I495 Sick sinus syndrome: Secondary | ICD-10-CM | POA: Diagnosis not present

## 2019-01-28 LAB — CUP PACEART REMOTE DEVICE CHECK
Battery Remaining Longevity: 172 mo
Battery Voltage: 3.13 V
Brady Statistic AP VP Percent: 0.01 %
Brady Statistic AP VS Percent: 15.25 %
Brady Statistic AS VP Percent: 0 %
Brady Statistic AS VS Percent: 84.74 %
Brady Statistic RA Percent Paced: 15.26 %
Brady Statistic RV Percent Paced: 0.01 %
Date Time Interrogation Session: 20200617122140
Implantable Lead Implant Date: 20190905
Implantable Lead Implant Date: 20190905
Implantable Lead Location: 753859
Implantable Lead Location: 753860
Implantable Lead Model: 3830
Implantable Lead Model: 5076
Implantable Pulse Generator Implant Date: 20190905
Lead Channel Impedance Value: 304 Ohm
Lead Channel Impedance Value: 342 Ohm
Lead Channel Impedance Value: 399 Ohm
Lead Channel Impedance Value: 399 Ohm
Lead Channel Pacing Threshold Amplitude: 0.75 V
Lead Channel Pacing Threshold Pulse Width: 0.4 ms
Lead Channel Sensing Intrinsic Amplitude: 2.5 mV
Lead Channel Sensing Intrinsic Amplitude: 6.125 mV
Lead Channel Setting Pacing Amplitude: 1.5 V
Lead Channel Setting Pacing Amplitude: 3.5 V
Lead Channel Setting Pacing Pulse Width: 1 ms
Lead Channel Setting Sensing Sensitivity: 0.9 mV

## 2019-02-01 NOTE — Progress Notes (Signed)
Remote pacemaker transmission.   

## 2019-02-13 ENCOUNTER — Other Ambulatory Visit: Payer: Self-pay | Admitting: Cardiology

## 2019-03-09 ENCOUNTER — Other Ambulatory Visit: Payer: Self-pay | Admitting: Oncology

## 2019-05-03 ENCOUNTER — Ambulatory Visit (INDEPENDENT_AMBULATORY_CARE_PROVIDER_SITE_OTHER): Payer: Medicare Other | Admitting: *Deleted

## 2019-05-03 DIAGNOSIS — I495 Sick sinus syndrome: Secondary | ICD-10-CM

## 2019-05-04 LAB — CUP PACEART REMOTE DEVICE CHECK
Battery Remaining Longevity: 168 mo
Battery Voltage: 3.08 V
Brady Statistic AP VP Percent: 0 %
Brady Statistic AP VS Percent: 24.36 %
Brady Statistic AS VP Percent: 0 %
Brady Statistic AS VS Percent: 75.64 %
Brady Statistic RA Percent Paced: 24.37 %
Brady Statistic RV Percent Paced: 0 %
Date Time Interrogation Session: 20200921163453
Implantable Lead Implant Date: 20190905
Implantable Lead Implant Date: 20190905
Implantable Lead Location: 753859
Implantable Lead Location: 753860
Implantable Lead Model: 3830
Implantable Lead Model: 5076
Implantable Pulse Generator Implant Date: 20190905
Lead Channel Impedance Value: 304 Ohm
Lead Channel Impedance Value: 342 Ohm
Lead Channel Impedance Value: 418 Ohm
Lead Channel Impedance Value: 437 Ohm
Lead Channel Pacing Threshold Amplitude: 0.875 V
Lead Channel Pacing Threshold Pulse Width: 0.4 ms
Lead Channel Sensing Intrinsic Amplitude: 2.625 mV
Lead Channel Sensing Intrinsic Amplitude: 2.625 mV
Lead Channel Sensing Intrinsic Amplitude: 6 mV
Lead Channel Sensing Intrinsic Amplitude: 6 mV
Lead Channel Setting Pacing Amplitude: 1.75 V
Lead Channel Setting Pacing Amplitude: 3.5 V
Lead Channel Setting Pacing Pulse Width: 1 ms
Lead Channel Setting Sensing Sensitivity: 0.9 mV

## 2019-05-10 ENCOUNTER — Encounter: Payer: Self-pay | Admitting: Cardiology

## 2019-05-10 NOTE — Progress Notes (Signed)
Remote pacemaker transmission.   

## 2019-05-14 ENCOUNTER — Other Ambulatory Visit: Payer: Self-pay

## 2019-05-15 ENCOUNTER — Other Ambulatory Visit: Payer: Self-pay | Admitting: Cardiology

## 2019-05-18 ENCOUNTER — Ambulatory Visit (INDEPENDENT_AMBULATORY_CARE_PROVIDER_SITE_OTHER): Payer: Medicare Other | Admitting: Obstetrics & Gynecology

## 2019-05-18 ENCOUNTER — Other Ambulatory Visit: Payer: Self-pay

## 2019-05-18 DIAGNOSIS — Z01419 Encounter for gynecological examination (general) (routine) without abnormal findings: Secondary | ICD-10-CM

## 2019-05-18 NOTE — Progress Notes (Signed)
68 y.o. [id number] Married White or Caucasian female here for annual exam.  Pt had a cough that was evaluated by nursing staff prior to pt being seen and it was felt it was best for pt to wait for appt.  Appt cancelled.  It will be rescheduled when she has improved.

## 2019-05-19 ENCOUNTER — Encounter: Payer: Self-pay | Admitting: Obstetrics & Gynecology

## 2019-06-01 ENCOUNTER — Encounter: Payer: Self-pay | Admitting: Obstetrics & Gynecology

## 2019-06-01 ENCOUNTER — Other Ambulatory Visit: Payer: Self-pay

## 2019-06-01 ENCOUNTER — Ambulatory Visit (INDEPENDENT_AMBULATORY_CARE_PROVIDER_SITE_OTHER): Payer: Medicare Other | Admitting: Obstetrics & Gynecology

## 2019-06-01 VITALS — BP 140/70 | HR 76 | Temp 97.2°F | Ht 62.25 in | Wt 199.6 lb

## 2019-06-01 DIAGNOSIS — Z01419 Encounter for gynecological examination (general) (routine) without abnormal findings: Secondary | ICD-10-CM

## 2019-06-01 MED ORDER — HYDROCHLOROTHIAZIDE 25 MG PO TABS: 25.0000 mg | ORAL_TABLET | Freq: Every day | ORAL | 1 refills | Status: AC

## 2019-06-01 NOTE — Progress Notes (Signed)
68 y.o. G29P2002 Married White or Caucasian female here for annual exam.  Doing well.  Had pacemaker placed last September.  Feels like she has so much more energy.  It took about six months after the pacemaker placement to really feel better.  Biggest issue now is constipation.  She is eating prunes.   Denies vaginal bleeding.     Seeing Dr. Jana Hakim next month and she will be off her Anastrozole in December.    Does need appt with Dr. Radford Pax.    Patient's last menstrual period was 11/21/2010.          Sexually active: No.  The current method of family planning is post menopausal status.    Exercising: Yes.    yard work  Smoker:  no  Health Maintenance: Pap:  02/17/18 ASCUS. HR HPV:neg   12/11/15 Neg. HR HPV:neg   History of abnormal Pap:  no MMG:  07/20/18 Diagnostic Bilateral BIRADS2:Benign. F/u 1 year  Colonoscopy:  Cologuard 04/02/17 Neg  BMD:   05/30/15 Normal  TDaP:  unsure Pneumonia vaccine(s): PCP Shingrix:   No Flu vacc: 05/13/19 Hep C testing: PCP Screening Labs: PCP   reports that she has quit smoking. Her smoking use included cigarettes. She quit after 4.00 years of use. She quit smokeless tobacco use about 45 years ago. She reports current alcohol use of about 3.0 standard drinks of alcohol per week. She reports that she does not use drugs.  Past Medical History:  Diagnosis Date  . A-fib ([place])   . Allergic dermatitis   . Anxiety   . Arthritis    back, knees & ankles   . Atrial flutter ([place]) 05/25/2016  . Benign essential HTN 02/04/2014  . Breast cancer ([place])   . Cancer ([place])    skin- lower abdomen  . Depression   . Dyslipidemia 08/16/2011  . Family history of breast cancer   . Genetic testing 05/12/2015   Negative genetic testing on the Breast/Ovarian cancer panel.  The Breast/Ovarian gene panel offered by [facility] includes sequencing and rearrangement analysis for the following 20 genes:  ATM, BARD1, BRCA1, BRCA2, BRIP1, CDH1, CHEK2, EPCAM, FANCC, MLH1, MSH2, MSH6, NBN,  PALB2, PMS2, PTEN, RAD51C, RAD51D, TP53, and XRCC2.   The report date is May 11, 2015.   [name] GERD (gastroesophageal reflux disease)   . Hiatal hernia    with GERD  . Hypercholesteremia   . Hypertension   . Hyperthyroidism    pt. reports that she thinks she has hypothyroidism, states MD- [place] took her off med. in the past 1-2 yrs.   . Malignant neoplasm of upper-outer quadrant of right breast in female, estrogen receptor positive ([place]) 04/24/2015  . Mild cognitive impairment with memory loss   . Morbid obesity ([place])   . OSA (obstructive sleep apnea) 02/04/2014  . Paroxysmal atrial fibrillation ([place]) 02/04/2014  . Sleep apnea    severe with AHI 37/hr now on CPAP at 13cm H2O  . SVT (supraventricular tachycardia) ([place])    likely due to atrial flutter.  successfullly ablated by Dr Lovena Le 10/17  . 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 ABLATION  06/2016   Dr. Delilah Shan  . APPENDECTOMY  28 years ago  . DILATION AND CURETTAGE OF UTERUS  1996    Polyp resection   . ELECTROPHYSIOLOGIC STUDY N/A 05/27/2016   Procedure: EPS/SVT Ablation;  Surgeon: Evans Lance, MD;  Location: [facility];  Service: Cardiovascular;  Laterality: N/A;  . HYSTEROSCOPY  4/12   D&C  . PACEMAKER IMPLANT N/A 04/16/2018   Medtronic Azure XT DR MRI SureScan PPM implanted by Dr Rayann Heman for afib with post termination pauses/ sick sinus syndrome  . RADIOACTIVE SEED GUIDED PARTIAL MASTECTOMY WITH AXILLARY SENTINEL LYMPH NODE BIOPSY Right 05/18/2015   Procedure: RIGHT BREAST RADIOACTIVE SEED PARTIAL MASTECTOMY WITH RIGHT SENTINEL LYMPH NODE MAPPING;  Surgeon: Erroll Luna, MD;  Location: [facility];  Service: General;  Laterality: Right;  . TONSILLECTOMY    . TUBAL LIGATION      Current Outpatient Medications  Medication Sig Dispense Refill  . acetaminophen (TYLENOL) 500 MG tablet Take 500 mg by mouth every 6 (six) hours as needed for  pain.    [name] anastrozole (ARIMIDEX) 1 MG tablet Take 1 tablet by mouth once daily 90 tablet 0  . atorvastatin (LIPITOR) 20 MG tablet Take 20 mg by mouth daily after breakfast.     . CARTIA XT 120 MG 24 hr capsule TAKE 1 CAPSULE BY MOUTH ONCE DAILY 90 capsule 2  . cholecalciferol (VITAMIN D) 1000 units tablet Take 1,000 Units by mouth daily.    . flecainide (TAMBOCOR) 100 MG tablet TAKE 1 TABLET BY MOUTH EVERY 12 HOURS 180 tablet 2  . hydrochlorothiazide (HYDRODIURIL) 25 MG tablet Take 1 tablet (25 mg total) by mouth daily. Please call and schedule an appointment for further refills 2nd attempt 14 tablet 0  . Omega-3 Fatty Acids (FISH OIL) 1000 MG CAPS Take 1,000 mg by mouth daily.    . traMADol (ULTRAM) 50 MG tablet Take 1 tablet (50 mg total) by mouth every 6 (six) hours as needed for moderate pain. 30 tablet 0  . vitamin B-12 (CYANOCOBALAMIN) 1000 MCG tablet Take 1,000 mcg by mouth daily.    [name] warfarin (COUMADIN) 5 MG tablet Take 1.5 tablets (7.5 mg total) by mouth daily. Resume on 05/30/2016 (Patient taking differently: Take 5-7.5 mg by mouth See admin instructions. 10m on Mon and Thurs and 7.519mSun, Tue, Wed, Fri, Sat)     No current facility-administered medications for this visit.     Family History  Problem Relation Age of Onset  . Hypertension Mother   . Brain cancer Mother   . Hypertension Sister   . Thyroid disease Sister   . Hypertension Sister   . Hypertension Brother   . Thyroid disease Brother   . Diabetes Maternal Grandmother   . Heart attack Maternal Grandmother   . Breast cancer Paternal Aunt        dx in her late [id number] [id number]. Melanoma Paternal Aunt   . Breast cancer Paternal Aunt   . Multiple myeloma Paternal Aunt   . Other Daughter [id number]     ITP    Review of Systems  All other systems reviewed and are negative.   Exam:   BP 140/70   Pulse 76   Temp (!) 97.2 F (36.2 C) (Temporal)   Ht 5' 2.25" (1.581 m)   Wt 199 lb 9.6 oz (90.5 kg)   LMP 11/21/2010  Comment: spotting-had hysteroscopy/d&c  BMI 36.21 kg/m  Weight loss: -16#   Height: 5' 2.25" (158.1 cm)  Ht Readings from Last 3 Encounters:  06/01/19 5' 2.25" (1.581 m)  07/20/18 5' 2.5" (1.588 m)  06/09/18 5' 2.5" (1.588 m)    General appearance: alert, cooperative and appears stated age Head: Normocephalic, without obvious abnormality, atraumatic Neck: no adenopathy, supple, symmetrical,  trachea midline and thyroid normal to inspection and palpation Lungs: clear to auscultation bilaterally Breasts: left breast skin changes and thickening along scar d/w radidation changes, skin erythema is improved, no left axillary LAD; right breast without masses or skin changes, no nipple discharge or left axillary LAD Heart: regular rate and rhythm Abdomen: soft, non-tender; bowel sounds normal; no masses,  no organomegaly Extremities: extremities normal, atraumatic, no cyanosis or edema Skin: Skin color, texture, turgor normal. No rashes or lesions Lymph nodes: Cervical, supraclavicular, and axillary nodes normal. No abnormal inguinal nodes palpated Neurologic: Grossly normal   Pelvic: External genitalia:  no lesions              Urethra:  normal appearing urethra with no masses, tenderness or lesions              Bartholins and Skenes: normal                 Vagina: normal appearing vagina with normal color and discharge, no lesions              Cervix: no lesions              Pap taken: No. Bimanual Exam:  Uterus:  normal size, contour, position, consistency, mobility, non-tender              Adnexa: normal adnexa and no mass, fullness, tenderness               Rectovaginal: Confirms               Anus:  normal sphincter tone, no lesions  Chaperone was present for exam.  A:  Well Woman with normal exam PMP, no HRT H/o afib, on coumadin, h/o cardiac ablation, now with pacemaker Hypothyroidism H/o MI followed Dr. Radford Pax H/o elevated lipids on statin H/o breast cancer Stage 1A,  ER/PR+  P:   Mammogram scheduled for December pap smear 7/19, ASCUS with neg HR HPV Does blood work with Dr. Forde Dandy Vaccinations reviewed.  She will check with Dr. Forde Dandy about whether her pneumonia and Tdap vaccinations Plan BMD next year Cologuard due next year Return annually or prn

## 2019-06-01 NOTE — Patient Instructions (Addendum)
Colace 100mg .  You can take up to four a day.  Generic is docusate sodium.    Dulcolax suppositories are helpful too especially if you feel like the BM is "right there".

## 2019-06-05 ENCOUNTER — Other Ambulatory Visit: Payer: Self-pay | Admitting: Nurse Practitioner

## 2019-06-19 NOTE — Progress Notes (Signed)
[facility]  [phone] [phone]     ID: Courtney Dunlap DOB: 02-03-1951  MR#: [id number]  KZS#:[id number]  Patient Care Team: Reynold Bowen, MD as PCP - General (Endocrinology) Sueanne Margarita, MD as PCP - Cardiology (Cardiology) Wenda Low, MD as Consulting Physician (Internal Medicine) Sueanne Margarita, MD as Consulting Physician (Cardiology) Erroll Luna, MD as Consulting Physician (General Surgery) Sevana Grandinetti, Virgie Dad, MD as Consulting Physician (Oncology) Eppie Gibson, MD as Attending Physician (Radiation Oncology) Rockwell Germany, RN as Registered Nurse Mauro Kaufmann, RN as Registered Nurse Megan Salon, MD as Consulting Physician (Gynecology) Sylvan Cheese, NP as Nurse Practitioner (Nurse Practitioner) OTHER MD: Christene Slates M.D.  CHIEF COMPLAINT: Estrogen receptor positive breast cancer  CURRENT TREATMENT: To complete 5 years of anastrozole   INTERVAL HISTORY: Courtney Dunlap returns today for follow-up of her estrogen receptor positive breast cancer.   She continues on anastrozole, which she tolerates well.  She has mild hot flashes.  Vaginal dryness is not a major issue for her.  Since her last visit, she underwent bilateral diagnostic mammography with tomography at [facility] on 07/12/2018 showing: breast density category B; no evidence of malignancy in either breast.  She is already scheduled for repeat mammography next month    REVIEW OF SYSTEMS: Courtney Dunlap had her pacemaker placed by Dr. Rayann Heman and tells me since that time she has had a lot of energy.  She is working in her yard.  She is walking a mile a day.  She has lost a little bit of weight.  She does feels terrific overall.  She and her husband are keeping appropriate pandemic precautions.  A detailed review of systems today was otherwise noncontributory.   BREAST CANCER HISTORY: From the original intake note:  Kenzey had routine screening mammography at [facility]  04/11/2015 showing a new irregular lesion in the right breast. Diagnostic right unilateral mammogram and ultrasound 04/13/2015 found the breast density to be category B. In the right breast at the 9:00 position there was a mass with indistinct margins associated with architectural distortion. By ultrasound this was a 1 cm. Ultrasound of the axilla showed no abnormality.  Biopsy of the right breast mass in question 04/18/2015 showed ([id number]) and invasive ductal carcinoma, grade 2, estrogen receptor and progesterone receptor both 100% positive, with strong staining intensity, with an MIB-1 of 3%, and no HER-2 amplification, the signals ratio being 1.59 and the number per cell 2.30.  The patient's subsequent history is as detailed below.    PAST MEDICAL HISTORY: Past Medical History:  Diagnosis Date  . A-fib ([place])   . Allergic dermatitis   . Anxiety   . Arthritis    back, knees & ankles   . Atrial flutter ([place]) 05/25/2016  . Benign essential HTN 02/04/2014  . Breast cancer ([place])   . Cancer ([place])    skin- lower abdomen  . Depression   . Dyslipidemia 08/16/2011  . Family history of breast cancer   . Genetic testing 05/12/2015   Negative genetic testing on the Breast/Ovarian cancer panel.  The Breast/Ovarian gene panel offered by [facility] includes sequencing and rearrangement analysis for the following 20 genes:  ATM, BARD1, BRCA1, BRCA2, BRIP1, CDH1, CHEK2, EPCAM, FANCC, MLH1, MSH2, MSH6, NBN, PALB2, PMS2, PTEN, RAD51C, RAD51D, TP53, and XRCC2.   The report date is May 11, 2015.   [name] GERD (gastroesophageal reflux disease)   . Hiatal hernia    with GERD  . Hypercholesteremia   .  Hypertension   . Hyperthyroidism    pt. reports that she thinks she has hypothyroidism, states MD- [place] took her off med. in the past 1-2 yrs.   . Malignant neoplasm of upper-outer quadrant of right breast in female, estrogen receptor positive ([place]) 04/24/2015  . Mild cognitive impairment with memory loss   .  Morbid obesity ([place])   . OSA (obstructive sleep apnea) 02/04/2014  . Paroxysmal atrial fibrillation ([place]) 02/04/2014  . Sleep apnea    severe with AHI 37/hr now on CPAP at 13cm H2O  . SVT (supraventricular tachycardia) ([place])    likely due to atrial flutter.  successfullly ablated by Dr Lovena Le 10/17  . 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 ABLATION  06/2016   Dr. Delilah Shan  . APPENDECTOMY  28 years ago  . DILATION AND CURETTAGE OF UTERUS  1996    Polyp resection   . ELECTROPHYSIOLOGIC STUDY N/A 05/27/2016   Procedure: EPS/SVT Ablation;  Surgeon: Evans Lance, MD;  Location: [facility];  Service: Cardiovascular;  Laterality: N/A;  . HYSTEROSCOPY  4/12   D&C  . PACEMAKER IMPLANT N/A 04/16/2018   Medtronic Azure XT DR MRI SureScan PPM implanted by Dr Rayann Heman for afib with post termination pauses/ sick sinus syndrome  . RADIOACTIVE SEED GUIDED PARTIAL MASTECTOMY WITH AXILLARY SENTINEL LYMPH NODE BIOPSY Right 05/18/2015   Procedure: RIGHT BREAST RADIOACTIVE SEED PARTIAL MASTECTOMY WITH RIGHT SENTINEL LYMPH NODE MAPPING;  Surgeon: Erroll Luna, MD;  Location: [facility];  Service: General;  Laterality: Right;  . TONSILLECTOMY    . TUBAL LIGATION      FAMILY HISTORY Family History  Problem Relation Age of Onset  . Hypertension Mother   . Brain cancer Mother   . Hypertension Sister   . Thyroid disease Sister   . Hypertension Sister   . Hypertension Brother   . Thyroid disease Brother   . Diabetes Maternal Grandmother   . Heart attack Maternal Grandmother   . Breast cancer Paternal Aunt        dx in her late 63s- early 27s  . Melanoma Paternal Aunt   . Breast cancer Paternal Aunt   . Multiple myeloma Paternal Aunt   . Other Daughter 67       ITP   the patient's father was diagnosed with squamous cell cancer of the throat at age 61 and died 69 years later. The patient's mother  died from end-stage renal disease at age 16. The patient had one brother, 2 sisters. There is no cancer in the immediate family except as noted, but one of the patient's father's 5 sisters was diagnosed with breast cancer before the age of 2. Another 1 had multiple myeloma. On the patient's mother sides there is a history of brain tumor in a niece.    GYNECOLOGIC HISTORY:  Patient's last menstrual period was 11/21/2010. Menarche age 20, first live birth age 71. The patient is GX P2. She went through menopause in 2002 or thereabouts. She did not take hormone replacement. 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 Her husband Courtney Dunlap is a retired Clinical biochemist. Daughter Courtney Dunlap lives in [place] and is a housewife. Daughter "Courtney Dunlap" and Dunlap this in [place]. 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 The patient has 2 grandchildren and 3 great-grandchildren. She is a Psychologist, forensic.    ADVANCED DIRECTIVES: Not in place   HEALTH MAINTENANCE: Social History   Tobacco Use  . Smoking status: Former Smoker    Years: 4.00    Types: Cigarettes  . Smokeless tobacco: Former Systems developer    Quit date: 05/15/1974  . Tobacco comment: quit 35-40 years ago  Substance Use Topics  . Alcohol use: Yes    Alcohol/week: 3.0 standard drinks    Types: 3 Standard drinks or equivalent per week  . Drug use: No     Colonoscopy: Cologuard 03/2017, negative  PAP: 02/2018, ASCUS  Bone density: October 2016 at [facility], normal  Lipid panel:  Allergies  Allergen Reactions  . Codeine Swelling    Facial/lips  . Adhesive [Tape] Itching and Rash    MUST USE PAPER TAPE-CAUSES BLISTERS AND BRUISES ALSO  . Latex Itching and Rash    Current Outpatient Medications  Medication Sig Dispense Refill  . acetaminophen  (TYLENOL) 500 MG tablet Take 500 mg by mouth every 6 (six) hours as needed for pain.    [name] anastrozole (ARIMIDEX) 1 MG tablet Take 1 tablet (1 mg total) by mouth daily. 90 tablet 4  . atorvastatin (LIPITOR) 20 MG tablet Take 20 mg by mouth daily after breakfast.     . CARTIA XT 120 MG 24 hr capsule Take 1 capsule by mouth once daily 90 capsule 0  . cholecalciferol (VITAMIN D) 1000 units tablet Take 1,000 Units by mouth daily.    . flecainide (TAMBOCOR) 100 MG tablet TAKE 1 TABLET BY MOUTH EVERY 12 HOURS 180 tablet 2  . hydrochlorothiazide (HYDRODIURIL) 25 MG tablet Take 1 tablet (25 mg total) by mouth daily. Please call and schedule an appointment for further refills 2nd attempt 90 tablet 1  . Omega-3 Fatty Acids (FISH OIL) 1000 MG CAPS Take 1,000 mg by mouth daily.    . vitamin B-12 (CYANOCOBALAMIN) 1000 MCG tablet Take 1,000 mcg by mouth daily.    [name] warfarin (COUMADIN) 5 MG tablet Take 1.5 tablets (7.5 mg total) by mouth daily. Resume on 05/30/2016 (Patient taking differently: Take 5-7.5 mg by mouth See admin instructions. 46m on Mon and Thurs and 7.584mSun, Tue, Wed, Fri, Sat)     No current facility-administered medications for this visit.     OBJECTIVE: Middle-aged white woman in no acute distress  Vitals:   06/21/19 1007  BP: (!) 151/62  Resp: 18  Temp: 99.1 F (37.3 C)  SpO2: 98%     Body mass index is 36.72 kg/m.    ECOG FS:1 - Symptomatic but completely ambulatory  Sclerae unicteric, EOMs intact Wearing a mask No cervical or supraclavicular adenopathy Lungs no rales or rhonchi Heart regular rate and rhythm Abd soft, nontender, positive bowel sounds MSK no focal spinal tenderness, no upper extremity lymphedema Neuro: nonfocal, well oriented, appropriate affect Breasts: The right breast is status post lumpectomy and radiation.  There is considerable irregularity and multiple "bumps" due to scars but these are stable and she does check her breast on a regular basis.  Left  breast is benign.  Both axillae are benign.   LAB RESULTS:  CMP     Component Value Date/Time   NA 141 06/21/2019 0952   NA 137 02/19/2018 1350   NA 140 06/09/2017 1341   K 3.6 06/21/2019 0952   K 3.8 06/09/2017 1341   CL 100 06/21/2019 0952   CO2 30  06/21/2019 0952   CO2 28 06/09/2017 1341   GLUCOSE 101 (H) 06/21/2019 0952   GLUCOSE 94 06/09/2017 1341   BUN 9 06/21/2019 0952   BUN 12 02/19/2018 1350   BUN 13.9 06/09/2017 1341   CREATININE 0.78 06/21/2019 0952   CREATININE 0.7 06/09/2017 1341   CALCIUM 9.4 06/21/2019 0952   CALCIUM 9.7 06/09/2017 1341   PROT 7.8 06/21/2019 0952   PROT 7.6 06/09/2017 1341   ALBUMIN 4.0 06/21/2019 0952   ALBUMIN 3.8 06/09/2017 1341   AST 17 06/21/2019 0952   AST 27 06/09/2017 1341   ALT 19 06/21/2019 0952   ALT 34 06/09/2017 1341   ALKPHOS 166 (H) 06/21/2019 0952   ALKPHOS 157 (H) 06/09/2017 1341   BILITOT 0.6 06/21/2019 0952   BILITOT 0.69 06/09/2017 1341   GFRNONAA >60 06/21/2019 0952   GFRAA >60 06/21/2019 0952    INo results found for: SPEP, UPEP  Lab Results  Component Value Date   WBC 7.9 06/21/2019   NEUTROABS 5.4 06/21/2019   HGB 13.9 06/21/2019   HCT 41.9 06/21/2019   MCV 94.2 06/21/2019   PLT 228 06/21/2019      Chemistry      Component Value Date/Time   NA 141 06/21/2019 0952   NA 137 02/19/2018 1350   NA 140 06/09/2017 1341   K 3.6 06/21/2019 0952   K 3.8 06/09/2017 1341   CL 100 06/21/2019 0952   CO2 30 06/21/2019 0952   CO2 28 06/09/2017 1341   BUN 9 06/21/2019 0952   BUN 12 02/19/2018 1350   BUN 13.9 06/09/2017 1341   CREATININE 0.78 06/21/2019 0952   CREATININE 0.7 06/09/2017 1341      Component Value Date/Time   CALCIUM 9.4 06/21/2019 0952   CALCIUM 9.7 06/09/2017 1341   ALKPHOS 166 (H) 06/21/2019 0952   ALKPHOS 157 (H) 06/09/2017 1341   AST 17 06/21/2019 0952   AST 27 06/09/2017 1341   ALT 19 06/21/2019 0952   ALT 34 06/09/2017 1341   BILITOT 0.6 06/21/2019 0952   BILITOT 0.69 06/09/2017  1341       No results found for: LABCA2  No components found for: GUYQI347  No results for input(s): INR in the last 168 hours.  Urinalysis    Component Value Date/Time   COLORURINE YELLOW 05/25/2016 [place] 05/25/2016 1645   LABSPEC 1.008 05/25/2016 1645   PHURINE 7.5 05/25/2016 [place] 05/25/2016 [place] 05/25/2016 [place] 12/26/2016 1108   [place] 05/25/2016 1645   PROTEINUR N 12/26/2016 1108   PROTEINUR NEGATIVE 05/25/2016 1645   UROBILINOGEN negative (A) 12/26/2016 1108   NITRITE N 12/26/2016 1108   NITRITE NEGATIVE 05/25/2016 1645   LEUKOCYTESUR Trace (A) 12/26/2016 1108    STUDIES: No results found.   ASSESSMENT: 68 y.o. [place] woman, status post right breast upper outer quadrant biopsy 04/18/2015 for a clinical T1b N0, stage IA invasive ductal carcinoma, grade 2, strongly estrogen and progesterone receptor positive, HER-2 not amplified, with an MIB-1 of 3%  (1) status post right lumpectomy and sentinel lymph node sampling 05/18/2015 for a  pT1c pN0, stage IA invasive ductal carcinoma, grade 1, repeat HER-2 again negative  (2) Oncotype DX score of 11 predicts an outside the breast recurrence within 10 years of 7% if the patient's only systemic therapy is tamoxifen for 5 years. It also predicts no benefit from chemotherapy  (3) adjuvant radiation completed December 2017  thank you  (4) genetics testing 05/11/2015 through the Breast/Ovarian gene panel offered by [facility] found no deleterious mutations in ATM, BARD1, BRCA1, BRCA2, BRIP1, CDH1, CHEK2, EPCAM, FANCC, MLH1, MSH2, MSH6, NBN, PALB2, PMS2, PTEN, RAD51C, RAD51D, TP53, and XRCC2.  (5) started anastrozole NOV /2016  (a) DEXA scan at [facility] 05/30/2015 showed a T score of 1.0 (normal)  (b) anastrozole hed Jul;y 2017 due to arthralgias/ myalgias  (c) anastrozole resumed October 2017 as there was no change in symptoms  off the drug  PLAN: Austin is now 4 years out from definitive testing for breast cancer with no evidence of disease recurrence.  This is very favorable.  She will continue anastrozole to complete a total of 5 years.  She will stop at the next visit with Dr. Brantley Stage sometime next year  At this point I feel comfortable releasing her to her primary care physician.  All she will need in terms of breast cancer follow-up is a yearly mammography, which she obtains at [facility] in December, and a yearly physician breast exam  I will be glad to see Jasira again at any point in the future if and when the need arises but as of now are making no further routine appointments for her here.  Julliette Frentz, Virgie Dad, MD  06/21/19 10:26 AM Medical Oncology and Hematology [facility] [place], [place] [address] Tel. [phone]    Fax. [phone]   This document serves as a record of services personally performed by Lurline Del, MD. It was created on her behalf by Wilburn Mylar, a trained medical scribe. The creation of this record is based on the scribe's personal observations and the provider's statements to them. This document has been checked and approved by the attending provider.  I, Lurline Del MD, have reviewed the above documentation for accuracy and completeness, and I agree with the above.

## 2019-06-21 ENCOUNTER — Other Ambulatory Visit: Payer: Self-pay | Admitting: *Deleted

## 2019-06-21 ENCOUNTER — Other Ambulatory Visit: Payer: Self-pay

## 2019-06-21 ENCOUNTER — Inpatient Hospital Stay: Payer: Medicare Other | Admitting: Oncology

## 2019-06-21 ENCOUNTER — Inpatient Hospital Stay: Payer: Medicare Other | Attending: Oncology

## 2019-06-21 VITALS — BP 151/62 | Temp 99.1°F | Resp 18 | Ht 62.25 in | Wt 202.4 lb

## 2019-06-21 DIAGNOSIS — Z923 Personal history of irradiation: Secondary | ICD-10-CM | POA: Insufficient documentation

## 2019-06-21 DIAGNOSIS — Z170 Estrogen receptor positive status [ER+]: Secondary | ICD-10-CM | POA: Diagnosis not present

## 2019-06-21 DIAGNOSIS — C50411 Malignant neoplasm of upper-outer quadrant of right female breast: Secondary | ICD-10-CM

## 2019-06-21 DIAGNOSIS — Z79811 Long term (current) use of aromatase inhibitors: Secondary | ICD-10-CM | POA: Insufficient documentation

## 2019-06-21 DIAGNOSIS — I10 Essential (primary) hypertension: Secondary | ICD-10-CM | POA: Insufficient documentation

## 2019-06-21 DIAGNOSIS — Z79899 Other long term (current) drug therapy: Secondary | ICD-10-CM | POA: Diagnosis not present

## 2019-06-21 DIAGNOSIS — E785 Hyperlipidemia, unspecified: Secondary | ICD-10-CM

## 2019-06-21 LAB — CMP (CANCER CENTER ONLY)
ALT: 19 U/L (ref 0–44)
AST: 17 U/L (ref 15–41)
Albumin: 4 g/dL (ref 3.5–5.0)
Alkaline Phosphatase: 166 U/L — ABNORMAL HIGH (ref 38–126)
Anion gap: 11 (ref 5–15)
BUN: 9 mg/dL (ref 8–23)
CO2: 30 mmol/L (ref 22–32)
Calcium: 9.4 mg/dL (ref 8.9–10.3)
Chloride: 100 mmol/L (ref 98–111)
Creatinine: 0.78 mg/dL (ref 0.44–1.00)
GFR, Est AFR Am: >60 mL/min (ref >60)
GFR, Estimated: >60 mL/min (ref >60)
Glucose, Bld: 101 mg/dL — ABNORMAL HIGH (ref 70–99)
Potassium: 3.6 mmol/L (ref 3.5–5.1)
Sodium: 141 mmol/L (ref 135–145)
Total Bilirubin: 0.6 mg/dL (ref 0.3–1.2)
Total Protein: 7.8 g/dL (ref 6.5–8.1)

## 2019-06-21 LAB — CBC WITH DIFFERENTIAL (CANCER CENTER ONLY)
Abs Immature Granulocytes: 0.03 10*3/uL (ref 0.00–0.07)
Basophils Absolute: 0.1 10*3/uL (ref 0.0–0.1)
Basophils Relative: 1 %
Eosinophils Absolute: 0.2 10*3/uL (ref 0.0–0.5)
Eosinophils Relative: 2 %
HCT: 41.9 % (ref 36.0–46.0)
Hemoglobin: 13.9 g/dL (ref 12.0–15.0)
Immature Granulocytes: 0 %
Lymphocytes Relative: 19 %
Lymphs Abs: 1.5 10*3/uL (ref 0.7–4.0)
MCH: 31.2 pg (ref 26.0–34.0)
MCHC: 33.2 g/dL (ref 30.0–36.0)
MCV: 94.2 fL (ref 80.0–100.0)
Monocytes Absolute: 0.8 10*3/uL (ref 0.1–1.0)
Monocytes Relative: 10 %
Neutro Abs: 5.4 10*3/uL (ref 1.7–7.7)
Neutrophils Relative %: 68 %
Platelet Count: 228 10*3/uL (ref 150–400)
RBC: 4.45 MIL/uL (ref 3.87–5.11)
RDW: 13.2 % (ref 11.5–15.5)
WBC Count: 7.9 10*3/uL (ref 4.0–10.5)
nRBC: 0 % (ref 0.0–0.2)

## 2019-06-21 MED ORDER — ANASTROZOLE 1 MG PO TABS: 1.0000 mg | ORAL_TABLET | Freq: Every day | ORAL | 4 refills | Status: DC

## 2019-07-03 ENCOUNTER — Other Ambulatory Visit: Payer: Self-pay | Admitting: Nurse Practitioner

## 2019-07-05 ENCOUNTER — Other Ambulatory Visit: Payer: Self-pay | Admitting: Nurse Practitioner

## 2019-07-15 ENCOUNTER — Ambulatory Visit: Payer: Medicare Other | Admitting: Obstetrics & Gynecology

## 2019-07-22 ENCOUNTER — Other Ambulatory Visit: Payer: Self-pay

## 2019-07-22 ENCOUNTER — Encounter: Payer: Self-pay | Admitting: Obstetrics & Gynecology

## 2019-07-22 ENCOUNTER — Encounter: Payer: Self-pay | Admitting: Student

## 2019-07-22 ENCOUNTER — Ambulatory Visit: Payer: Medicare Other | Admitting: Student

## 2019-07-22 VITALS — BP 134/72 | HR 98 | Ht 62.0 in | Wt 206.6 lb

## 2019-07-22 DIAGNOSIS — I480 Paroxysmal atrial fibrillation: Secondary | ICD-10-CM | POA: Diagnosis not present

## 2019-07-22 DIAGNOSIS — G4733 Obstructive sleep apnea (adult) (pediatric): Secondary | ICD-10-CM

## 2019-07-22 DIAGNOSIS — Z950 Presence of cardiac pacemaker: Secondary | ICD-10-CM | POA: Diagnosis not present

## 2019-07-22 LAB — BASIC METABOLIC PANEL
BUN/Creatinine Ratio: 19 (ref 12–28)
BUN: 14 mg/dL (ref 8–27)
CO2: 27 mmol/L (ref 20–29)
Calcium: 9.7 mg/dL (ref 8.7–10.3)
Chloride: 99 mmol/L (ref 96–106)
Creatinine, Ser: 0.72 mg/dL (ref 0.57–1.00)
GFR calc Af Amer: 100 mL/min/{1.73_m2} (ref >59)
GFR calc non Af Amer: 86 mL/min/{1.73_m2} (ref >59)
Glucose: 89 mg/dL (ref 65–99)
Potassium: 3.5 mmol/L (ref 3.5–5.2)
Sodium: 141 mmol/L (ref 134–144)

## 2019-07-22 LAB — CUP PACEART INCLINIC DEVICE CHECK
Battery Remaining Longevity: 166 mo
Battery Voltage: 3.06 V
Brady Statistic AP VP Percent: 0.01 %
Brady Statistic AP VS Percent: 21.74 %
Brady Statistic AS VP Percent: 0 %
Brady Statistic AS VS Percent: 78.26 %
Brady Statistic RA Percent Paced: 21.74 %
Brady Statistic RV Percent Paced: 0.01 %
Date Time Interrogation Session: 20201210110128
Implantable Lead Implant Date: 20190905
Implantable Lead Implant Date: 20190905
Implantable Lead Location: 753859
Implantable Lead Location: 753860
Implantable Lead Model: 3830
Implantable Lead Model: 5076
Implantable Pulse Generator Implant Date: 20190905
Lead Channel Impedance Value: 323 Ohm
Lead Channel Impedance Value: 380 Ohm
Lead Channel Impedance Value: 437 Ohm
Lead Channel Impedance Value: 456 Ohm
Lead Channel Pacing Threshold Amplitude: 0.75 V
Lead Channel Pacing Threshold Pulse Width: 0.4 ms
Lead Channel Sensing Intrinsic Amplitude: 2.25 mV
Lead Channel Sensing Intrinsic Amplitude: 4.5 mV
Lead Channel Sensing Intrinsic Amplitude: 6.375 mV
Lead Channel Sensing Intrinsic Amplitude: 8 mV
Lead Channel Setting Pacing Amplitude: 1.5 V
Lead Channel Setting Pacing Amplitude: 3.5 V
Lead Channel Setting Pacing Pulse Width: 1 ms
Lead Channel Setting Sensing Sensitivity: 0.9 mV

## 2019-07-22 LAB — MAGNESIUM: Magnesium: 2.2 mg/dL (ref 1.6–2.3)

## 2019-07-22 MED ORDER — FLECAINIDE ACETATE 100 MG PO TABS: 100.0000 mg | ORAL_TABLET | Freq: Two times a day (BID) | ORAL | 0 refills | Status: DC

## 2019-07-22 MED ORDER — DILTIAZEM HCL ER COATED BEADS 120 MG PO CP24: 120.0000 mg | ORAL_CAPSULE | Freq: Every day | ORAL | 0 refills | Status: DC

## 2019-07-22 NOTE — Patient Instructions (Signed)
Medication Instructions:  none *If you need a refill on your cardiac medications before your next appointment, please call your pharmacy*  Lab Work:TODAY BMET MAGNESIUM If you have labs (blood work) drawn today and your tests are completely normal, you will receive your results only by: [name] MyChart Message (if you have MyChart) OR . A paper copy in the mail If you have any lab test that is abnormal or we need to change your treatment, we will call you to review the results.  Testing/Procedures: none  Follow-Up: At [facility], you and your health needs are our priority.  As part of our continuing mission to provide you with exceptional heart care, we have created designated Provider Care Teams.  These Care Teams include your primary Cardiologist (physician) and Advanced Practice Providers (APPs -  Physician Assistants and Nurse Practitioners) who all work together to provide you with the care you need, when you need it.  Your next appointment:   1 year(s)  The format for your next appointment:   Either In Person or Virtual  Provider:   Dr Rayann Heman  Other Instructions Remote monitoring is used to monitor your Pacemaker  from home. This monitoring reduces the number of office visits required to check your device to one time per year. It allows [place] to keep an eye on the functioning of your device to ensure it is working properly. You are scheduled for a device check from home on 08/02/19. You may send your transmission at any time that day. If you have a wireless device, the transmission will be sent automatically. After your physician reviews your transmission, you will receive a postcard with your next transmission date.

## 2019-07-22 NOTE — Progress Notes (Signed)
° ° °Electrophysiology Office Note °Date: 07/22/2019 ° °ID:  Courtney Dunlap, DOB 08/02/1951, MRN [id number] ° °PCP: South, Stephen, MD °Primary Cardiologist: Traci Turner, MD °Electrophysiologist: Dr. Allred  ° °CC: Pacemaker follow-up ° °Courtney Dunlap is a 68 y.o. female seen today for Dr. Allred . she presents today for routine electrophysiology followup.  Since last being seen in our clinic, the patient reports doing very well.  she denies chest pain, palpitations, dyspnea, PND, orthopnea, nausea, vomiting, dizziness, syncope, edema, weight gain, or early satiety. ° °Device History: °Medtronic Dual Chamber PPM implanted 04/2018 for symptomatic sinus bradycardia ° °Past Medical History:  °Diagnosis Date  °• A-fib (HCC)   °• Allergic dermatitis   °• Anxiety   °• Arthritis   ° back, knees & ankles   °• Atrial flutter (HCC) 05/25/2016  °• Benign essential HTN 02/04/2014  °• Breast cancer (HCC)   °• Cancer (HCC)   ° skin- lower abdomen  °• Depression   °• Dyslipidemia 08/16/2011  °• Family history of breast cancer   °• Genetic testing 05/12/2015  ° Negative genetic testing on the Breast/Ovarian cancer panel.  The Breast/Ovarian gene panel offered by [facility] includes sequencing and rearrangement analysis for the following 20 genes:  ATM, BARD1, BRCA1, BRCA2, BRIP1, CDH1, CHEK2, EPCAM, FANCC, MLH1, MSH2, MSH6, NBN, PALB2, PMS2, PTEN, RAD51C, RAD51D, TP53, and XRCC2.   The report date is May 11, 2015.   °• GERD (gastroesophageal reflux disease)   °• Hiatal hernia   ° with GERD  °• Hypercholesteremia   °• Hypertension   °• Hyperthyroidism   ° pt. reports that she thinks she has hypothyroidism, states MD- [place] took her off med. in the past 1-2 yrs.   °• Malignant neoplasm of upper-outer quadrant of right breast in female, estrogen receptor positive (HCC) 04/24/2015  °• Mild cognitive impairment with memory loss   °• Morbid obesity (HCC)   °• OSA (obstructive sleep apnea) 02/04/2014  °• Paroxysmal atrial fibrillation (HCC)  02/04/2014  °• Sleep apnea   ° severe with AHI 37/hr now on CPAP at 13cm H2O  °• SVT (supraventricular tachycardia) (HCC)   ° likely due to atrial flutter.  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 Kendall  °• APPENDECTOMY  28 years ago  °• DILATION AND CURETTAGE OF UTERUS  1996   ° Polyp resection   °• ELECTROPHYSIOLOGIC STUDY N/A 05/27/2016  ° Procedure: EPS/SVT Ablation;  Surgeon: Gregg W Taylor, MD;  Location: [facility];  Service: Cardiovascular;  Laterality: N/A;  °• HYSTEROSCOPY  4/12  ° D&C  °• PACEMAKER IMPLANT N/A 04/16/2018  ° Medtronic Azure XT DR MRI SureScan PPM implanted by Dr Allred for afib with post termination pauses/ sick sinus syndrome  °• RADIOACTIVE SEED GUIDED PARTIAL MASTECTOMY WITH AXILLARY SENTINEL LYMPH NODE BIOPSY Right 05/18/2015  ° Procedure: RIGHT BREAST RADIOACTIVE SEED PARTIAL MASTECTOMY WITH RIGHT SENTINEL LYMPH NODE MAPPING;  Surgeon: Thomas Cornett, MD;  Location: [facility] OR;  Service: General;  Laterality: Right;  °• TONSILLECTOMY    °• TUBAL LIGATION    ° ° °Current Outpatient Medications  °Medication Sig Dispense Refill  °• acetaminophen (TYLENOL) 500 MG tablet Take 500 mg by mouth every 6 (six) hours as needed for pain.    °• anastrozole (ARIMIDEX) 1 MG tablet Take 1 tablet (1 mg total) by mouth   mouth daily. 90 tablet 4   atorvastatin (LIPITOR) 20 MG tablet Take 20 mg by mouth daily after breakfast.      CARTIA XT 120 MG 24 hr capsule Take 1 capsule by mouth once daily 90 capsule 0   cholecalciferol (VITAMIN D) 1000 units tablet Take 1,000 Units by mouth daily.     diclofenac Sodium (VOLTAREN) 1 % GEL diclofenac 1 % topical gel  APPLY AA QID     flecainide (TAMBOCOR) 100 MG tablet Take 1 tablet (100 mg total) by mouth every 12 (twelve) hours. Please make yearly appt with Dr. Rayann Heman for December before anymore refills. 1st  attempt 60 tablet 0   hydrochlorothiazide (HYDRODIURIL) 25 MG tablet Take 1 tablet (25 mg total) by mouth daily. Please call and schedule an appointment for further refills 2nd attempt 90 tablet 1   Omega-3 Fatty Acids (FISH OIL) 1000 MG CAPS Take 1,000 mg by mouth daily.     vitamin B-12 (CYANOCOBALAMIN) 1000 MCG tablet Take 1,000 mcg by mouth daily.     warfarin (COUMADIN) 5 MG tablet Take 1.5 tablets (7.5 mg total) by mouth daily. Resume on 05/30/2016 (Patient taking differently: Take 5-7.5 mg by mouth See admin instructions. 67m on Mon and Thurs and 7.534mSun, Tue, Wed, Fri, Sat)     No current facility-administered medications for this visit.    Allergies:   Codeine, Adhesive [tape], and Latex   Social History: Social History   Socioeconomic History   Marital status: Married    Spouse name: Not on file   Number of children: 2   Years of education: Not on file   Highest education level: Not on file  Occupational History    Employer: [employer]   Smoking status: Former Smoker    Years: 4.00    Types: Cigarettes   Smokeless tobacco: Former [employer] developer  Quit date: 05/15/1974   Tobacco comment: quit 35-40 years ago  Substance and Sexual Activity   Alcohol use: Yes    Alcohol/week: 3.0 standard drinks    Types: 3 Standard drinks or equivalent per week   Drug use: No   Sexual activity: Not Currently    Birth control/protection: Post-menopausal  Other Topics Concern   Not on file  Social History Narrative   Not on file   Social Determinants of Health   Financial Resource Strain:    Difficulty of Paying Living Expenses: Not on file  Food Insecurity:    Worried About RuGarfieldn the Last Year: Not on file   [employer] Food in the Last Year: Not on file  Transportation Needs:    Lack of Transportation (Medical): Not on file   Lack of Transportation (Non-Medical): Not on file  Physical Activity:    Days of Exercise per  Week: Not on file   Minutes of Exercise per Session: Not on file  Stress:    Feeling of Stress : Not on file  Social Connections:    Frequency of Communication with Friends and Family: Not on file   Frequency of Social Gatherings with Friends and Family: Not on file   Attends Religious Services: Not on file   Active Member of Clubs or Organizations: Not on file   Attends ClArchivisteetings: Not on file   Marital Status: Not on file  Intimate Partner Violence:    Fear of Current or Ex-Partner: Not on file   Emotionally Abused: Not on file  Physically Abused: Not on file   Sexually Abused: Not on file    Family History: Family History  Problem Relation Age of Onset   Hypertension Mother    Brain cancer Mother    Hypertension Sister    Thyroid disease Sister    Hypertension Sister    Hypertension Brother    Thyroid disease Brother    Diabetes Maternal Grandmother    Heart attack Maternal Grandmother    Breast cancer Paternal Aunt        dx in her late 33s- early 40s   Melanoma Paternal 44    Breast cancer Paternal Aunt    Multiple myeloma Paternal Aunt    Other Daughter 64       ITP     Review of Systems: All other systems reviewed and are otherwise negative except as noted above.  Physical Exam: Vitals:   07/22/19 1037  BP: 134/72  Pulse: 98  SpO2: 96%  Weight: 206 lb 9.6 oz (93.7 kg)  Height: 5' 2" (1.575 m)     GEN- The patient is well appearing, alert and oriented x 3 today.   HEENT: normocephalic, atraumatic; sclera clear, conjunctiva pink; hearing intact; oropharynx clear; neck supple  Lungs- Clear to ausculation bilaterally, normal work of breathing.  No wheezes, rales, rhonchi Heart- Regular rate and rhythm, no murmurs, rubs or gallops  GI- soft, non-tender, non-distended, bowel sounds present  Extremities- no clubbing, cyanosis, or edema  MS- no significant deformity or atrophy Skin- warm and dry, no rash or  lesion; PPM pocket well healed Psych- euthymic mood, full affect Neuro- strength and sensation are intact  PPM Interrogation- reviewed in detail today,  See PACEART report  EKG:  EKG is ordered today. The ekg ordered today shows NSR at 87 bpm with PR interval of 162 ms and QRS of 118 ms  Recent Labs: 06/21/2019: ALT 19; BUN 9; Creatinine 0.78; Hemoglobin 13.9; Platelet Count 228; Potassium 3.6; Sodium 141   Wt Readings from Last 3 Encounters:  07/22/19 206 lb 9.6 oz (93.7 kg)  06/21/19 202 lb 6.4 oz (91.8 kg)  06/01/19 199 lb 9.6 oz (90.5 kg)     Other studies Reviewed: Additional studies/ records that were reviewed today include: Echo 04/2018 shows LVEF 65-70%, Previous EP office notes, Previous remote checks, Most recent labwork.   Assessment and Plan:  1.  Symptomatic bradycardia s/p Medtronic PPM  Normal PPM function See Pace Art report No changes today  2. Paroxysmal atrial fibrillation AF burden is 0%. She is very excited about this.  Continue coumadin for CHA2DS2VASC of at least 3   Continue flecainide (swtiched from sotalol due to increased afib) Continue diltiazem 120 mg daily BMET and Mg today.  3. OSA Compliance with CPAP advised  4. Obesity Body mass index is 37.79 kg/m.   5. HTN Continue current medications  Current medicines are reviewed at length with the patient today.   The patient does not have concerns regarding her medicines.  The following changes were made today:  none  Labs/ tests ordered today include:  Orders Placed This Encounter  Procedures   Basic Metabolic Panel (BMET)   Magnesium   EKG 12-Lead   Disposition:   Follow up with Dr. Rayann Heman in 12 Months, Sooner with symptoms. Continue remote checks.   Jacalyn Lefevre, PA-C  07/22/2019 10:44 AM  [facility] [address] [place] [address] [phone] (office) [phone] (fax)

## 2019-07-29 ENCOUNTER — Other Ambulatory Visit: Payer: Self-pay | Admitting: Student

## 2019-07-29 DIAGNOSIS — I480 Paroxysmal atrial fibrillation: Secondary | ICD-10-CM

## 2019-08-02 ENCOUNTER — Ambulatory Visit (INDEPENDENT_AMBULATORY_CARE_PROVIDER_SITE_OTHER): Payer: Medicare Other | Admitting: *Deleted

## 2019-08-02 DIAGNOSIS — I495 Sick sinus syndrome: Secondary | ICD-10-CM

## 2019-08-04 LAB — CUP PACEART REMOTE DEVICE CHECK
Battery Remaining Longevity: 166 mo
Battery Voltage: 3.06 V
Brady Statistic AP VP Percent: 0 %
Brady Statistic AP VS Percent: 28.43 %
Brady Statistic AS VP Percent: 0 %
Brady Statistic AS VS Percent: 71.57 %
Brady Statistic RA Percent Paced: 28.45 %
Brady Statistic RV Percent Paced: 0 %
Date Time Interrogation Session: 20201223043544
Implantable Lead Implant Date: 20190905
Implantable Lead Implant Date: 20190905
Implantable Lead Location: 753859
Implantable Lead Location: 753860
Implantable Lead Model: 3830
Implantable Lead Model: 5076
Implantable Pulse Generator Implant Date: 20190905
Lead Channel Impedance Value: 342 Ohm
Lead Channel Impedance Value: 399 Ohm
Lead Channel Impedance Value: 418 Ohm
Lead Channel Impedance Value: 456 Ohm
Lead Channel Pacing Threshold Amplitude: 0.75 V
Lead Channel Pacing Threshold Pulse Width: 0.4 ms
Lead Channel Sensing Intrinsic Amplitude: 4.25 mV
Lead Channel Sensing Intrinsic Amplitude: 4.25 mV
Lead Channel Sensing Intrinsic Amplitude: 7.625 mV
Lead Channel Sensing Intrinsic Amplitude: 7.625 mV
Lead Channel Setting Pacing Amplitude: 1.5 V
Lead Channel Setting Pacing Amplitude: 3.5 V
Lead Channel Setting Pacing Pulse Width: 1 ms
Lead Channel Setting Sensing Sensitivity: 0.9 mV

## 2019-09-26 ENCOUNTER — Encounter: Payer: Self-pay | Admitting: Cardiology

## 2019-09-26 NOTE — Progress Notes (Signed)
Cardiology Office Note:    Date:  09/27/2019   ID:  Courtney Dunlap, DOB [birth year], MRN [id number]  PCP:  Courtney Bowen, MD  Cardiologist:  Courtney Him, MD    Referring MD: Courtney Bowen, MD   Chief Complaint  Patient presents with  . Atrial Fibrillation  . Hypertension  . Sleep Apnea    History of Present Illness:    Courtney Dunlap is a 69 y.o. female with a hx of PAF in the setting of hyperthyroidism, SVTs/p ablation, HTN and OSA on CPAP.  She had  recurrent atrial fibrillation with RVR and near syncope and tachybradycardia syndrome with posttermination pauses up to 5 seconds and underwent MDT dual-chamber pacemaker.  Her sotalol was stopped and she was started on flecainide and Cardizem at discharge.  She was seen back by Courtney Dunlap in the office post implant and was doing well.  She is here today for followup and is doing well.  She denies any chest pain or pressure, SOB, DOE, PND, orthopnea, LE edema, dizziness, palpitations or syncope. She is compliant with her meds and is tolerating meds with no SE.  She is doing well with her CPAP device and thinks that she has gotten used to it.  She tolerates the mask and feels the pressure is adequate.  Since going on CPAP she feels rested in the am and has no significant daytime sleepiness.  She denies any significant mouth or nasal dryness or nasal congestion.  She does not think that he snores.      Past Medical History:  Diagnosis Date  . Allergic dermatitis   . Anxiety   . Arthritis    back, knees & ankles   . Atrial flutter ([place]) 05/25/2016  . Benign essential HTN 02/04/2014  . Breast cancer ([place])   . Cancer ([place])    skin- lower abdomen  . Depression   . Dyslipidemia 08/16/2011  . Family history of breast cancer   . Genetic testing 05/12/2015   Negative genetic testing on the Breast/Ovarian cancer panel.  The Breast/Ovarian gene panel offered by [facility] includes sequencing and rearrangement analysis for the following 20  genes:  ATM, BARD1, BRCA1, BRCA2, BRIP1, CDH1, CHEK2, EPCAM, FANCC, MLH1, MSH2, MSH6, NBN, PALB2, PMS2, PTEN, RAD51C, RAD51D, TP53, and XRCC2.   The report date is May 11, 2015.   [name] GERD (gastroesophageal reflux disease)   . Hiatal hernia    with GERD  . Hypercholesteremia   . Hyperthyroidism    pt. reports that she thinks she has hypothyroidism, states MD- [place] took her off med. in the past 1-2 yrs.   . Malignant neoplasm of upper-outer quadrant of right breast in female, estrogen receptor positive ([place]) 04/24/2015  . Mild cognitive impairment with memory loss   . Morbid obesity ([place])   . OSA (obstructive sleep apnea) 02/04/2014  . Persistent atrial fibrillation ([place])   . Sleep apnea    severe with AHI 37/hr now on CPAP at 13cm H2O  . SVT (supraventricular tachycardia) ([place])    likely due to atrial flutter.  successfullly ablated by Dr Lovena Le 10/17  . Tachycardia-bradycardia syndrome ([place]) 05/04/2018   S/P MDT PPM    Past Surgical History:  Procedure Laterality Date  . ABLATION  06/2016   Dr. Delilah Dunlap  . APPENDECTOMY  28 years ago  . DILATION AND CURETTAGE OF UTERUS  1996    Polyp resection   . ELECTROPHYSIOLOGIC STUDY N/A 05/27/2016   Procedure: EPS/SVT Ablation;  Surgeon: Champ Mungo  Lovena Le, MD;  Location: [place] CV [facility];  Service: Cardiovascular;  Laterality: N/A;  . HYSTEROSCOPY  4/12   D&C  . PACEMAKER IMPLANT N/A 04/16/2018   Medtronic Azure XT DR MRI SureScan PPM implanted by Dr Courtney Dunlap for afib with post termination pauses/ sick sinus syndrome  . RADIOACTIVE SEED GUIDED PARTIAL MASTECTOMY WITH AXILLARY SENTINEL LYMPH NODE BIOPSY Right 05/18/2015   Procedure: RIGHT BREAST RADIOACTIVE SEED PARTIAL MASTECTOMY WITH RIGHT SENTINEL LYMPH NODE MAPPING;  Surgeon: Courtney Luna, MD;  Location: [place];  Service: General;  Laterality: Right;  . TONSILLECTOMY    . TUBAL LIGATION      Current Medications: Current Meds  Medication Sig  . acetaminophen (TYLENOL) 500 MG tablet Take  500 mg by mouth every 6 (six) hours as needed for pain.  [name] anastrozole (ARIMIDEX) 1 MG tablet Take 1 tablet (1 mg total) by mouth daily.  [name] atorvastatin (LIPITOR) 20 MG tablet Take 20 mg by mouth daily after breakfast.   . cholecalciferol (VITAMIN D) 1000 units tablet Take 1,000 Units by mouth daily.  . diclofenac Sodium (VOLTAREN) 1 % GEL diclofenac 1 % topical gel  APPLY AA QID  . diltiazem (CARTIA XT) 120 MG 24 hr capsule Take 1 capsule (120 mg total) by mouth daily.  . flecainide (TAMBOCOR) 100 MG tablet Take 1 tablet (100 mg total) by mouth every 12 (twelve) hours.  . hydrochlorothiazide (HYDRODIURIL) 25 MG tablet Take 1 tablet (25 mg total) by mouth daily. Please call and schedule an appointment for further refills 2nd attempt  . Omega-3 Fatty Acids (FISH OIL) 1000 MG CAPS Take 1,000 mg by mouth daily.  . vitamin B-12 (CYANOCOBALAMIN) 1000 MCG tablet Take 1,000 mcg by mouth daily.  [name] warfarin (COUMADIN) 5 MG tablet Take 1.5 tablets (7.5 mg total) by mouth daily. Resume on 05/30/2016 (Patient taking differently: Take 5-7.5 mg by mouth See admin instructions. 3m on Mon and Thurs and 7.5104mSun, Tue, Wed, Fri, Sat)     Allergies:   Codeine, Adhesive [tape], and Latex   Social History   Socioeconomic History  . Marital status: Married    Spouse name: Not on file  . Number of children: 2  . Years of education: Not on file  . Highest education level: Not on file  Occupational History    Employer: [employer]  . Smoking status: Former Smoker    Years: 4.00    Types: Cigarettes  . Smokeless tobacco: Former [employer] developer  Quit date: 05/15/1974  . Tobacco comment: quit 35-40 years ago  Substance and Sexual Activity  . Alcohol use: Yes    Alcohol/week: 3.0 standard drinks    Types: 3 Standard drinks or equivalent per week  . Drug use: No  . Sexual activity: Not Currently    Birth control/protection: Post-menopausal  Other Topics Concern  . Not on file  Social  History Narrative  . Not on file   Social Determinants of Health   Financial Resource Strain:   . Difficulty of Paying Living Expenses: Not on file  Food Insecurity:   . Worried About RuCharity fundraisern the Last Year: Not on file  . Ran Out of Food in the Last Year: Not on file  Transportation Needs:   . Lack of Transportation (Medical): Not on file  . Lack of Transportation (Non-Medical): Not on file  Physical Activity:   . Days of Exercise per Week: Not on file  . Minutes of Exercise  per Session: Not on file  Stress:   . Feeling of Stress : Not on file  Social Connections:   . Frequency of Communication with Friends and Family: Not on file  . Frequency of Social Gatherings with Friends and Family: Not on file  . Attends Religious Services: Not on file  . Active Member of Clubs or Organizations: Not on file  . Attends Archivist Meetings: Not on file  . Marital Status: Not on file     Family History: The patient's family history includes Brain cancer in her mother; Breast cancer in her paternal aunt and paternal aunt; Diabetes in her maternal grandmother; Heart attack in her maternal grandmother; Hypertension in her brother, mother, sister, and sister; Melanoma in her paternal aunt; Multiple myeloma in her paternal aunt; Other (age of onset: 29) in her daughter; Thyroid disease in her brother and sister.  ROS:   Please see the history of present illness.    ROS  All other systems reviewed and negative.   EKGs/Labs/Other Studies Reviewed:    The following studies were reviewed today: PAP compliance download, outside labs on KPN  EKG:  EKG is not  ordered today.    Recent Labs: 06/21/2019: ALT 19; Hemoglobin 13.9; Platelet Count 228 07/22/2019: BUN 14; Creatinine, Ser 0.72; Magnesium 2.2; Potassium 3.5; Sodium 141   Recent Lipid Panel No results found for: CHOL, TRIG, HDL, CHOLHDL, VLDL, LDLCALC, LDLDIRECT  Physical Exam:    VS:  BP 132/70   Pulse 75    Ht 5' 2" (1.575 m)   Wt 207 lb 12.8 oz (94.3 kg)   LMP 11/21/2010 Comment: spotting-had hysteroscopy/d&c  SpO2 96%   BMI 38.01 kg/m     Wt Readings from Last 3 Encounters:  09/27/19 207 lb 12.8 oz (94.3 kg)  07/22/19 206 lb 9.6 oz (93.7 kg)  06/21/19 202 lb 6.4 oz (91.8 kg)     GEN:  Well nourished, well developed in no acute distress HEENT: Normal NECK: No JVD; No carotid bruits LYMPHATICS: No lymphadenopathy CARDIAC: RRR, no murmurs, rubs, gallops RESPIRATORY:  Clear to auscultation without rales, wheezing or rhonchi  ABDOMEN: Soft, non-tender, non-distended MUSCULOSKELETAL:  No edema; No deformity  SKIN: Warm and dry NEUROLOGIC:  Alert and oriented x 3 PSYCHIATRIC:  Normal affect   ASSESSMENT:    1. Persistent atrial fibrillation ([place])   2. Benign essential HTN   3. OSA (obstructive sleep apnea)   4. Morbid obesity ([place])   5. Secondary hypercoagulable state ([place])    PLAN:    In order of problems listed above:  1.  Persistent atrial fibrillation -had tachybrady syndrome and is s/p PPM followed in device clinic -feels great after getting her PPM -denies any palpitations -continue Cardizem CD 126m daily and Flecainide 1080mBID -denies any bleeding on warfarin  2.  HTN -BP controlled -continue Cardizem CD 12068maily and HCTZ 19m44mily -creatinine was normal at 0.72, K+ 3.5 and Hbg 13.9 last fall on KPN  3.  OSA -  The patient is tolerating PAP therapy well without any problems. The PAP download was reviewed today and showed an AHI of 1/hr on auto PAP  with 67% compliance in using more than 4 hours nightly.  The patient has been using and benefiting from PAP use and will continue to benefit from therapy. Encouraged her to be more compliant with her device.  4.  Morbid Obesity -I have encouraged her to get into a routine exercise program and cut back  on carbs and portions.   5.  Secondary hypercoagulable state -continue warfarin -denies any bleeding problems  -continue warfarin -followed in coumadin clinic      Medication Adjustments/Labs and Tests Ordered: Current medicines are reviewed at length with the patient today.  Concerns regarding medicines are outlined above.  No orders of the defined types were placed in this encounter.  No orders of the defined types were placed in this encounter.   Signed, Courtney Him, MD  09/27/2019 10:34 AM    [place]

## 2019-09-27 ENCOUNTER — Encounter (INDEPENDENT_AMBULATORY_CARE_PROVIDER_SITE_OTHER): Payer: Self-pay

## 2019-09-27 ENCOUNTER — Ambulatory Visit: Payer: Medicare PPO | Admitting: Cardiology

## 2019-09-27 ENCOUNTER — Encounter: Payer: Self-pay | Admitting: Cardiology

## 2019-09-27 ENCOUNTER — Other Ambulatory Visit: Payer: Self-pay

## 2019-09-27 VITALS — BP 132/70 | HR 75 | Ht 62.0 in | Wt 207.8 lb

## 2019-09-27 DIAGNOSIS — I10 Essential (primary) hypertension: Secondary | ICD-10-CM

## 2019-09-27 DIAGNOSIS — G4733 Obstructive sleep apnea (adult) (pediatric): Secondary | ICD-10-CM | POA: Diagnosis not present

## 2019-09-27 DIAGNOSIS — I4819 Other persistent atrial fibrillation: Secondary | ICD-10-CM

## 2019-09-27 DIAGNOSIS — D6869 Other thrombophilia: Secondary | ICD-10-CM

## 2019-09-27 NOTE — Patient Instructions (Signed)
Medication Instructions:  Your physician recommends that you continue on your current medications as directed. Please refer to the Current Medication list given to you today.  *If you need a refill on your cardiac medications before your next appointment, please call your pharmacy*  Lab Work: None ordered  If you have labs (blood work) drawn today and your tests are completely normal, you will receive your results only by: [name] MyChart Message (if you have MyChart) OR . A paper copy in the mail If you have any lab test that is abnormal or we need to change your treatment, we will call you to review the results.  Testing/Procedures: None ordered  Follow-Up: At [facility], you and your health needs are our priority.  As part of our continuing mission to provide you with exceptional heart care, we have created designated Provider Care Teams.  These Care Teams include your primary Cardiologist (physician) and Advanced Practice Providers (APPs -  Physician Assistants and Nurse Practitioners) who all work together to provide you with the care you need, when you need it.  Your next appointment:   12 month(s)  The format for your next appointment:   In Person  Provider:   You may see Fransico Him, MD or one of the following Advanced Practice Providers on your designated Care Team:    Melina Copa, PA-C  Ermalinda Barrios, PA-C   Other Instructions

## 2019-10-24 ENCOUNTER — Other Ambulatory Visit: Payer: Self-pay | Admitting: Nurse Practitioner

## 2019-10-24 DIAGNOSIS — I480 Paroxysmal atrial fibrillation: Secondary | ICD-10-CM

## 2019-11-01 ENCOUNTER — Ambulatory Visit (INDEPENDENT_AMBULATORY_CARE_PROVIDER_SITE_OTHER): Payer: Medicare PPO | Admitting: *Deleted

## 2019-11-01 DIAGNOSIS — I495 Sick sinus syndrome: Secondary | ICD-10-CM | POA: Diagnosis not present

## 2019-11-01 LAB — CUP PACEART REMOTE DEVICE CHECK
Battery Remaining Longevity: 161 mo
Battery Voltage: 3.05 V
Brady Statistic AP VP Percent: 0.01 %
Brady Statistic AP VS Percent: 29.4 %
Brady Statistic AS VP Percent: 0 %
Brady Statistic AS VS Percent: 70.59 %
Brady Statistic RA Percent Paced: 29.49 %
Brady Statistic RV Percent Paced: 0.01 %
Date Time Interrogation Session: 20210321232232
Implantable Lead Implant Date: 20190905
Implantable Lead Implant Date: 20190905
Implantable Lead Location: 753859
Implantable Lead Location: 753860
Implantable Lead Model: 3830
Implantable Lead Model: 5076
Implantable Pulse Generator Implant Date: 20190905
Lead Channel Impedance Value: 304 Ohm
Lead Channel Impedance Value: 361 Ohm
Lead Channel Impedance Value: 418 Ohm
Lead Channel Impedance Value: 437 Ohm
Lead Channel Pacing Threshold Amplitude: 0.875 V
Lead Channel Pacing Threshold Pulse Width: 0.4 ms
Lead Channel Sensing Intrinsic Amplitude: 2.625 mV
Lead Channel Sensing Intrinsic Amplitude: 2.625 mV
Lead Channel Sensing Intrinsic Amplitude: 5.625 mV
Lead Channel Sensing Intrinsic Amplitude: 5.625 mV
Lead Channel Setting Pacing Amplitude: 1.75 V
Lead Channel Setting Pacing Amplitude: 3.5 V
Lead Channel Setting Pacing Pulse Width: 1 ms
Lead Channel Setting Sensing Sensitivity: 0.9 mV

## 2019-11-02 NOTE — Progress Notes (Signed)
PPM Remote  

## 2019-11-29 ENCOUNTER — Other Ambulatory Visit: Payer: Self-pay | Admitting: Obstetrics & Gynecology

## 2019-11-30 NOTE — Telephone Encounter (Signed)
Medication refill request: HCTZ Last AEX:  06/01/19 SM Next AEX: 08/18/20 Last MMG (if hormonal medication request): 07/22/19 BIRADS 2 benign Refill authorized: Please advise on refill; order pended #90 w/1 refill if authorized

## 2019-12-02 NOTE — Telephone Encounter (Signed)
Please call pharmacy and have rx sent to Dr. Forde Dandy who is her PCP please.  Please let pt know this will be forwarded to Dr. Forde Dandy.  Thanks.

## 2019-12-06 ENCOUNTER — Other Ambulatory Visit: Payer: Self-pay | Admitting: Obstetrics & Gynecology

## 2019-12-06 NOTE — Telephone Encounter (Signed)
Patient and pharmacy has been notified of prescription transfer to Dr. Forde Dandy.

## 2020-01-24 ENCOUNTER — Other Ambulatory Visit: Payer: Self-pay | Admitting: Student

## 2020-01-24 DIAGNOSIS — I480 Paroxysmal atrial fibrillation: Secondary | ICD-10-CM

## 2020-01-31 ENCOUNTER — Ambulatory Visit (INDEPENDENT_AMBULATORY_CARE_PROVIDER_SITE_OTHER): Payer: Medicare PPO | Admitting: *Deleted

## 2020-01-31 DIAGNOSIS — I495 Sick sinus syndrome: Secondary | ICD-10-CM | POA: Diagnosis not present

## 2020-01-31 LAB — CUP PACEART REMOTE DEVICE CHECK
Battery Remaining Longevity: 158 mo
Battery Voltage: 3.04 V
Brady Statistic AP VP Percent: 0 %
Brady Statistic AP VS Percent: 29.41 %
Brady Statistic AS VP Percent: 0 %
Brady Statistic AS VS Percent: 70.58 %
Brady Statistic RA Percent Paced: 29.42 %
Brady Statistic RV Percent Paced: 0 %
Date Time Interrogation Session: 20210620214256
Implantable Lead Implant Date: 20190905
Implantable Lead Implant Date: 20190905
Implantable Lead Location: 753859
Implantable Lead Location: 753860
Implantable Lead Model: 3830
Implantable Lead Model: 5076
Implantable Pulse Generator Implant Date: 20190905
Lead Channel Impedance Value: 304 Ohm
Lead Channel Impedance Value: 380 Ohm
Lead Channel Impedance Value: 399 Ohm
Lead Channel Impedance Value: 418 Ohm
Lead Channel Pacing Threshold Amplitude: 0.875 V
Lead Channel Pacing Threshold Pulse Width: 0.4 ms
Lead Channel Sensing Intrinsic Amplitude: 3.375 mV
Lead Channel Sensing Intrinsic Amplitude: 3.375 mV
Lead Channel Sensing Intrinsic Amplitude: 6.25 mV
Lead Channel Sensing Intrinsic Amplitude: 6.25 mV
Lead Channel Setting Pacing Amplitude: 1.75 V
Lead Channel Setting Pacing Amplitude: 3.5 V
Lead Channel Setting Pacing Pulse Width: 1 ms
Lead Channel Setting Sensing Sensitivity: 0.9 mV

## 2020-02-01 NOTE — Progress Notes (Signed)
Remote pacemaker transmission.   

## 2020-04-13 ENCOUNTER — Telehealth: Payer: Self-pay

## 2020-04-13 NOTE — Telephone Encounter (Signed)
Patient is returning call.  °

## 2020-04-13 NOTE — Telephone Encounter (Signed)
Left message for call back.

## 2020-04-13 NOTE — Telephone Encounter (Signed)
Patient states she would like to do the cologuard. Paperwork has been put together & will be faxed once signed.

## 2020-04-13 NOTE — Telephone Encounter (Signed)
-----   Message from Megan Salon, MD sent at 04/12/2020 10:17 AM EDT ----- Regarding: cologuard Joy, I have in my reminders that this pt needs a cologuard again this fall.  Could you call and make sure this is what she wants to proceed with?  Thanks.  Vinnie Level

## 2020-04-26 LAB — COLOGUARD: Cologuard: NEGATIVE

## 2020-04-27 ENCOUNTER — Other Ambulatory Visit: Payer: Self-pay | Admitting: Cardiology

## 2020-04-27 DIAGNOSIS — I480 Paroxysmal atrial fibrillation: Secondary | ICD-10-CM

## 2020-04-30 LAB — COLOGUARD: COLOGUARD: NEGATIVE

## 2020-05-01 ENCOUNTER — Ambulatory Visit (INDEPENDENT_AMBULATORY_CARE_PROVIDER_SITE_OTHER): Payer: Medicare PPO | Admitting: *Deleted

## 2020-05-01 DIAGNOSIS — I495 Sick sinus syndrome: Secondary | ICD-10-CM

## 2020-05-01 LAB — CUP PACEART REMOTE DEVICE CHECK
Battery Remaining Longevity: 154 mo
Battery Voltage: 3.04 V
Brady Statistic AP VP Percent: 0.01 %
Brady Statistic AP VS Percent: 29.78 %
Brady Statistic AS VP Percent: 0 %
Brady Statistic AS VS Percent: 70.21 %
Brady Statistic RA Percent Paced: 29.79 %
Brady Statistic RV Percent Paced: 0.01 %
Date Time Interrogation Session: 20210919205421
Implantable Lead Implant Date: 20190905
Implantable Lead Implant Date: 20190905
Implantable Lead Location: 753859
Implantable Lead Location: 753860
Implantable Lead Model: 3830
Implantable Lead Model: 5076
Implantable Pulse Generator Implant Date: 20190905
Lead Channel Impedance Value: 285 Ohm
Lead Channel Impedance Value: 304 Ohm
Lead Channel Impedance Value: 380 Ohm
Lead Channel Impedance Value: 399 Ohm
Lead Channel Pacing Threshold Amplitude: 0.875 V
Lead Channel Pacing Threshold Pulse Width: 0.4 ms
Lead Channel Sensing Intrinsic Amplitude: 2.125 mV
Lead Channel Sensing Intrinsic Amplitude: 2.125 mV
Lead Channel Sensing Intrinsic Amplitude: 5.375 mV
Lead Channel Sensing Intrinsic Amplitude: 5.375 mV
Lead Channel Setting Pacing Amplitude: 1.75 V
Lead Channel Setting Pacing Amplitude: 3.5 V
Lead Channel Setting Pacing Pulse Width: 1 ms
Lead Channel Setting Sensing Sensitivity: 0.9 mV

## 2020-05-02 ENCOUNTER — Telehealth: Payer: Self-pay

## 2020-05-02 DIAGNOSIS — Z1212 Encounter for screening for malignant neoplasm of rectum: Secondary | ICD-10-CM

## 2020-05-02 DIAGNOSIS — Z1211 Encounter for screening for malignant neoplasm of colon: Secondary | ICD-10-CM

## 2020-05-02 NOTE — Telephone Encounter (Signed)
Patient notified of negative cologuard result & to recheck in 39yrs.

## 2020-05-02 NOTE — Progress Notes (Signed)
Remote pacemaker transmission.   

## 2020-07-31 ENCOUNTER — Ambulatory Visit (INDEPENDENT_AMBULATORY_CARE_PROVIDER_SITE_OTHER): Payer: Medicare PPO | Admitting: Internal Medicine

## 2020-07-31 ENCOUNTER — Other Ambulatory Visit: Payer: Self-pay

## 2020-07-31 VITALS — BP 146/82 | HR 85 | Ht 62.0 in | Wt 201.2 lb

## 2020-07-31 DIAGNOSIS — I480 Paroxysmal atrial fibrillation: Secondary | ICD-10-CM

## 2020-07-31 DIAGNOSIS — D6869 Other thrombophilia: Secondary | ICD-10-CM

## 2020-07-31 DIAGNOSIS — G4733 Obstructive sleep apnea (adult) (pediatric): Secondary | ICD-10-CM | POA: Diagnosis not present

## 2020-07-31 LAB — CUP PACEART INCLINIC DEVICE CHECK
Battery Remaining Longevity: 151 mo
Battery Voltage: 3.03 V
Brady Statistic AP VP Percent: 0.01 %
Brady Statistic AP VS Percent: 29.42 %
Brady Statistic AS VP Percent: 0 %
Brady Statistic AS VS Percent: 70.58 %
Brady Statistic RA Percent Paced: 29.44 %
Brady Statistic RV Percent Paced: 0.01 %
Date Time Interrogation Session: 20211220160517
Implantable Lead Implant Date: 20190905
Implantable Lead Implant Date: 20190905
Implantable Lead Location: 753859
Implantable Lead Location: 753860
Implantable Lead Model: 3830
Implantable Lead Model: 5076
Implantable Pulse Generator Implant Date: 20190905
Lead Channel Impedance Value: 304 Ohm
Lead Channel Impedance Value: 361 Ohm
Lead Channel Impedance Value: 418 Ohm
Lead Channel Impedance Value: 418 Ohm
Lead Channel Pacing Threshold Amplitude: 0.75 V
Lead Channel Pacing Threshold Amplitude: 1.5 V
Lead Channel Pacing Threshold Pulse Width: 0.4 ms
Lead Channel Pacing Threshold Pulse Width: 0.4 ms
Lead Channel Sensing Intrinsic Amplitude: 4.5 mV
Lead Channel Sensing Intrinsic Amplitude: 6.75 mV
Lead Channel Setting Pacing Amplitude: 1.75 V
Lead Channel Setting Pacing Amplitude: 2.5 V
Lead Channel Setting Pacing Pulse Width: 0.4 ms
Lead Channel Setting Sensing Sensitivity: 0.9 mV

## 2020-07-31 NOTE — Progress Notes (Signed)
PCP: Reynold Bowen, MD Primary Cardiologist: Dr Radford Pax Primary EP:  Dr Rayann Heman  Courtney Dunlap is a 69 y.o. female who presents today for routine electrophysiology followup.  Since last being seen in our clinic, the patient reports doing very well.  Today, she denies symptoms of palpitations, chest pain, shortness of breath,  lower extremity edema, dizziness, presyncope, or syncope.  The patient is otherwise without complaint today.   Past Medical History:  Diagnosis Date  . Allergic dermatitis   . Anxiety   . Arthritis    back, knees & ankles   . Atrial flutter ([place]) 05/25/2016  . Benign essential HTN 02/04/2014  . Breast cancer ([place])   . Cancer ([place])    skin- lower abdomen  . Depression   . Dyslipidemia 08/16/2011  . Family history of breast cancer   . Genetic testing 05/12/2015   Negative genetic testing on the Breast/Ovarian cancer panel.  The Breast/Ovarian gene panel offered by [facility] includes sequencing and rearrangement analysis for the following 20 genes:  ATM, BARD1, BRCA1, BRCA2, BRIP1, CDH1, CHEK2, EPCAM, FANCC, MLH1, MSH2, MSH6, NBN, PALB2, PMS2, PTEN, RAD51C, RAD51D, TP53, and XRCC2.   The report date is May 11, 2015.   [name] GERD (gastroesophageal reflux disease)   . Hiatal hernia    with GERD  . Hypercholesteremia   . Hyperthyroidism    pt. reports that she thinks she has hypothyroidism, states MD- [place] took her off med. in the past 1-2 yrs.   . Malignant neoplasm of upper-outer quadrant of right breast in female, estrogen receptor positive ([place]) 04/24/2015  . Mild cognitive impairment with memory loss   . Morbid obesity ([place])   . OSA (obstructive sleep apnea) 02/04/2014  . Persistent atrial fibrillation ([place])   . Sleep apnea    severe with AHI 37/hr now on CPAP at 13cm H2O  . SVT (supraventricular tachycardia) ([place])    likely due to atrial flutter.  successfullly ablated by Dr Lovena Le 10/17  . 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 ABLATION  06/2016   Dr. Delilah Shan  . APPENDECTOMY  28 years ago  . DILATION AND CURETTAGE OF UTERUS  1996    Polyp resection   . ELECTROPHYSIOLOGIC STUDY N/A 05/27/2016   Procedure: EPS/SVT Ablation;  Surgeon: Evans Lance, MD;  Location: [facility];  Service: Cardiovascular;  Laterality: N/A;  . HYSTEROSCOPY  4/12   D&C  . PACEMAKER IMPLANT N/A 04/16/2018   Medtronic Azure XT DR MRI SureScan PPM implanted by Dr Rayann Heman for afib with post termination pauses/ sick sinus syndrome  . RADIOACTIVE SEED GUIDED PARTIAL MASTECTOMY WITH AXILLARY SENTINEL LYMPH NODE BIOPSY Right 05/18/2015   Procedure: RIGHT BREAST RADIOACTIVE SEED PARTIAL MASTECTOMY WITH RIGHT SENTINEL LYMPH NODE MAPPING;  Surgeon: Erroll Luna, MD;  Location: [facility];  Service: General;  Laterality: Right;  . TONSILLECTOMY    . TUBAL LIGATION      ROS- all systems are reviewed and negative except as per HPI above  Current Outpatient Medications  Medication Sig Dispense Refill  . acetaminophen (TYLENOL) 500 MG tablet Take 500 mg by mouth every 6 (six) hours as needed for pain.    [name] anastrozole (ARIMIDEX) 1 MG tablet Take 1 tablet (1 mg total) by mouth daily. 90 tablet 4  . atorvastatin (LIPITOR) 20 MG tablet Take 20 mg by mouth daily after breakfast.     . CARTIA  XT 120 MG 24 hr capsule Take 1 capsule by mouth once daily 90 capsule 3  . cholecalciferol (VITAMIN D) 1000 units tablet Take 1,000 Units by mouth daily.    . diclofenac Sodium (VOLTAREN) 1 % GEL diclofenac 1 % topical gel  APPLY AA QID    . flecainide (TAMBOCOR) 100 MG tablet TAKE 1 TABLET(100 MG) BY MOUTH EVERY 12 HOURS 180 tablet 1  . hydrochlorothiazide (HYDRODIURIL) 25 MG tablet Take 1 tablet (25 mg total) by mouth daily. Please call and schedule an appointment for further refills 2nd attempt 90 tablet 1  . Omega-3 Fatty Acids (FISH OIL) 1000 MG CAPS Take 1,000 mg by mouth daily.    . vitamin B-12  (CYANOCOBALAMIN) 1000 MCG tablet Take 1,000 mcg by mouth daily.    . warfarin (COUMADIN) 5 MG tablet Take 1.5 tablets (7.5 mg total) by mouth daily. Resume on 05/30/2016     No current facility-administered medications for this visit.    Physical Exam: Vitals:   07/31/20 1526  BP: (!) 146/82  Pulse: 85  SpO2: 98%  Weight: 201 lb 3.2 oz (91.3 kg)  Height: 5' 2" (1.575 m)    GEN- The patient is well appearing, alert and oriented x 3 today.   Head- normocephalic, atraumatic Eyes-  Sclera clear, conjunctiva pink Ears- hearing intact Oropharynx- clear Lungs- Clear to ausculation bilaterally, normal work of breathing Chest- pacemaker pocket is well healed Heart- Regular rate and rhythm, no murmurs, rubs or gallops, PMI not laterally displaced GI- soft, NT, ND, + BS Extremities- no clubbing, cyanosis, or edema  Pacemaker interrogation- reviewed in detail today,  See PACEART report  ekg tracing ordered today is personally reviewed and shows sinus with rbbb, lahb  Assessment and Plan:  1. Symptomatic sinus bradycardia  Normal pacemaker function See Pace Art report Rate response is turned on today.  She feels "tired" at times. she is not device dependant today  2. Paroxysmal atrial fibrillation afib burden by PPM is 0 % chads2vasc score is 3.  She is on coumadin Well controlled with flecainide.  We will follow closely on this medicine to avoid toxicity.  I have advised that we could reduce flecainide to 50mg BID today.  She does not wish to make any changes.  3. OSA Compliance wth CPAP advised  4. HTN Stable No change required today  5. Morbid obesity Body mass index is 36.8 kg/m. lifesytle modification advised  Risks, benefits and potential toxicities for medications prescribed and/or refilled reviewed with patient today.   Return to see EP PA in a year    MD, FACC 07/31/2020 3:34 PM   

## 2020-07-31 NOTE — Patient Instructions (Signed)
Medication Instructions:  Your physician recommends that you continue on your current medications as directed. Please refer to the Current Medication list given to you today.  *If you need a refill on your cardiac medications before your next appointment, please call your pharmacy*  Lab Work: None ordered.  If you have labs (blood work) drawn today and your tests are completely normal, you will receive your results only by: [name] MyChart Message (if you have MyChart) OR . A paper copy in the mail If you have any lab test that is abnormal or we need to change your treatment, we will call you to review the results.  Testing/Procedures: None ordered.  Follow-Up: At [facility], you and your health needs are our priority.  As part of our continuing mission to provide you with exceptional heart care, we have created designated Provider Care Teams.  These Care Teams include your primary Cardiologist (physician) and Advanced Practice Providers (APPs -  Physician Assistants and Nurse Practitioners) who all work together to provide you with the care you need, when you need it.  We recommend signing up for the patient portal called "MyChart".  Sign up information is provided on this After Visit Summary.  MyChart is used to connect with patients for Virtual Visits (Telemedicine).  Patients are able to view lab/test results, encounter notes, upcoming appointments, etc.  Non-urgent messages can be sent to your provider as well.   To learn more about what you can do with MyChart, go to [email or web address].    Your next appointment:   Your physician wants you to follow-up in: 1 year with Oda Kilts. You will receive a reminder letter in the mail two months in advance. If you don't receive a letter, please call our office to schedule the follow-up appointment.  Remote monitoring is used to monitor your Pacemaker from home. This monitoring reduces the number of office visits required to check your  device to one time per year. It allows [place] to keep an eye on the functioning of your device to ensure it is working properly. You are scheduled for a device check from home on 08/01/21. You may send your transmission at any time that day. If you have a wireless device, the transmission will be sent automatically. After your physician reviews your transmission, you will receive a postcard with your next transmission date.  Other Instructions:

## 2020-08-01 ENCOUNTER — Ambulatory Visit (INDEPENDENT_AMBULATORY_CARE_PROVIDER_SITE_OTHER): Payer: Medicare PPO

## 2020-08-01 DIAGNOSIS — I495 Sick sinus syndrome: Secondary | ICD-10-CM | POA: Diagnosis not present

## 2020-08-01 LAB — CUP PACEART REMOTE DEVICE CHECK
Battery Remaining Longevity: 151 mo
Battery Voltage: 3.03 V
Brady Statistic AP VP Percent: 0.01 %
Brady Statistic AP VS Percent: 29.21 %
Brady Statistic AS VP Percent: 0 %
Brady Statistic AS VS Percent: 70.79 %
Brady Statistic RA Percent Paced: 29.2 %
Brady Statistic RV Percent Paced: 0.01 %
Date Time Interrogation Session: 20211220213331
Implantable Lead Implant Date: 20190905
Implantable Lead Implant Date: 20190905
Implantable Lead Location: 753859
Implantable Lead Location: 753860
Implantable Lead Model: 3830
Implantable Lead Model: 5076
Implantable Pulse Generator Implant Date: 20190905
Lead Channel Impedance Value: 304 Ohm
Lead Channel Impedance Value: 361 Ohm
Lead Channel Impedance Value: 418 Ohm
Lead Channel Impedance Value: 418 Ohm
Lead Channel Pacing Threshold Amplitude: 0.875 V
Lead Channel Pacing Threshold Pulse Width: 0.4 ms
Lead Channel Sensing Intrinsic Amplitude: 1.25 mV
Lead Channel Sensing Intrinsic Amplitude: 4.5 mV
Lead Channel Sensing Intrinsic Amplitude: 5.625 mV
Lead Channel Sensing Intrinsic Amplitude: 6.75 mV
Lead Channel Setting Pacing Amplitude: 1.75 V
Lead Channel Setting Pacing Amplitude: 2.5 V
Lead Channel Setting Pacing Pulse Width: 0.4 ms
Lead Channel Setting Sensing Sensitivity: 0.9 mV

## 2020-08-02 NOTE — Addendum Note (Signed)
Addended by: Rose Phi on: 08/02/2020 12:55 PM   Modules accepted: Orders

## 2020-08-15 NOTE — Progress Notes (Signed)
Remote pacemaker transmission.   

## 2020-08-18 ENCOUNTER — Ambulatory Visit: Payer: Medicare Other | Admitting: Obstetrics & Gynecology

## 2020-09-12 ENCOUNTER — Other Ambulatory Visit: Payer: Self-pay | Admitting: Oncology

## 2020-09-14 ENCOUNTER — Telehealth: Payer: Self-pay | Admitting: Oncology

## 2020-09-14 NOTE — Telephone Encounter (Signed)
Scheduled appt per 2/2 sch msg - pt  Is aware of appt date and time

## 2020-09-20 ENCOUNTER — Other Ambulatory Visit: Payer: Self-pay | Admitting: Oncology

## 2020-10-21 ENCOUNTER — Other Ambulatory Visit: Payer: Self-pay | Admitting: Nurse Practitioner

## 2020-10-21 DIAGNOSIS — I480 Paroxysmal atrial fibrillation: Secondary | ICD-10-CM

## 2020-10-22 NOTE — Progress Notes (Signed)
[facility]  [phone] [phone]     ID: Courtney Dunlap DOB: September 04, 1950  MR#: [id number]  VOJ#:[id number]  Patient Care Team: Courtney Bowen, MD as PCP - General (Endocrinology) Courtney Margarita, MD as PCP - Cardiology (Cardiology) Courtney Low, MD as Consulting Physician (Internal Medicine) Courtney Margarita, MD as Consulting Physician (Cardiology) Courtney Luna, MD as Consulting Physician (General Surgery) Courtney Dunlap, Courtney Dad, MD as Consulting Physician (Oncology) Courtney Gibson, MD as Attending Physician (Radiation Oncology) Courtney Germany, RN as Registered Nurse Courtney Kaufmann, RN as Registered Nurse Courtney Salon, MD as Consulting Physician (Gynecology) Courtney Cheese, NP as Nurse Practitioner (Nurse Practitioner) Courtney Grayer, MD as Consulting Physician (Cardiology) OTHER MD: Courtney Dunlap M.D.  CHIEF COMPLAINT: Estrogen receptor positive breast cancer  CURRENT TREATMENT:    INTERVAL HISTORY: Courtney Dunlap returns today for follow-up of her estrogen receptor positive breast cancer. She was released from follow up in 06/2019 after completing 5 years of anastrozole.  However perhaps by fortuitous mistake as she was called for an appointment and came back today  Courtney Dunlap tells me she had a little tenderness in the upper outer portion of her right breast and noticed a bump there that she wanted to discuss.  Since that time the tenderness has resolved but the irregularity has not  Screening mammography at [facility] 07/24/2020 showed breast density category B.  There were only benign postoperative findings, no mammographic evidence of malignancy.  She is scheduled for repeat mammography 07/30/2021.    REVIEW OF SYSTEMS: [name] tells me her husband has required a great deal of care over the past year also her sister has significant medical issues followed at [facility].  All this weighs on her and all she can do is the housework driving shopping and  taking care of her husband right now.  She is not able to otherwise exercise.  She does have some issues with her left leg muscles and she is thinking of orthopedic consultation at some point.  A detailed review of systems today was otherwise stable   COVID 19 VACCINATION STATUS: Status post vaccine x2 with booster November 2021   BREAST CANCER HISTORY: From the original intake note:  [name] had routine screening mammography at [facility] 04/11/2015 showing a new irregular lesion in the right breast. Diagnostic right unilateral mammogram and ultrasound 04/13/2015 found the breast density to be category B. In the right breast at the 9:00 position there was a mass with indistinct margins associated with architectural distortion. By ultrasound this was a 1 cm. Ultrasound of the axilla showed no abnormality.  Biopsy of the right breast mass in question 04/18/2015 showed ([id number]) and invasive ductal carcinoma, grade 2, estrogen receptor and progesterone receptor both 100% positive, with strong staining intensity, with an MIB-1 of 3%, and no HER-2 amplification, the signals ratio being 1.59 and the number per cell 2.30.  The patient's subsequent history is as detailed below.    PAST MEDICAL HISTORY: Past Medical History:  Diagnosis Date   Allergic dermatitis    Anxiety    Arthritis    back, knees & ankles    Atrial flutter ([place]) 05/25/2016   Benign essential HTN 02/04/2014   Breast cancer ([place])    Cancer ([place])    skin- lower abdomen   Depression    Dyslipidemia 08/16/2011   Family history of breast cancer    Genetic testing 05/12/2015   Negative genetic testing on the Breast/Ovarian cancer panel.  The Breast/Ovarian gene  panel offered by [facility] includes sequencing and rearrangement analysis for the following 20 genes:  ATM, BARD1, BRCA1, BRCA2, BRIP1, CDH1, CHEK2, EPCAM, FANCC, MLH1, MSH2, MSH6, NBN, PALB2, PMS2, PTEN, RAD51C, RAD51D, TP53, and XRCC2.   The report date is May 11, 2015.    GERD (gastroesophageal reflux disease)    Hiatal hernia    with GERD   Hypercholesteremia    Hyperthyroidism    pt. reports that she thinks she has hypothyroidism, states MD- [place] took her off med. in the past 1-2 yrs.    Malignant neoplasm of upper-outer quadrant of right breast in female, estrogen receptor positive ([place]) 04/24/2015   Mild cognitive impairment with memory loss    Morbid obesity ([place])    OSA (obstructive sleep apnea) 02/04/2014   Persistent atrial fibrillation ([facility])    Sleep apnea    severe with AHI 37/hr now on CPAP at 13cm H2O   SVT (supraventricular tachycardia) ([place])    likely due to atrial flutter.  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 Delilah Shan   APPENDECTOMY  28 years ago   [place]    Polyp resection    ELECTROPHYSIOLOGIC STUDY N/A 05/27/2016   Procedure: EPS/SVT Ablation;  Surgeon: Evans Lance, MD;  Location: [facility];  Service: Cardiovascular;  Laterality: N/A;   HYSTEROSCOPY  4/12   D&C   PACEMAKER IMPLANT N/A 04/16/2018   Medtronic Azure XT DR MRI SureScan PPM implanted by Dr Rayann Heman for afib with post termination pauses/ sick sinus syndrome   RADIOACTIVE SEED GUIDED PARTIAL MASTECTOMY WITH AXILLARY SENTINEL LYMPH NODE BIOPSY Right 05/18/2015   Procedure: RIGHT BREAST RADIOACTIVE SEED PARTIAL MASTECTOMY WITH RIGHT SENTINEL LYMPH NODE MAPPING;  Surgeon: Courtney Luna, MD;  Location: [facility];  Service: General;  Laterality: Right;   TONSILLECTOMY     TUBAL LIGATION      FAMILY HISTORY Family History  Problem Relation Age of Onset   Hypertension Mother    Brain cancer Mother    Hypertension Sister    Thyroid disease Sister    Hypertension Sister    Hypertension Brother    Thyroid disease Brother     Diabetes Maternal Grandmother    Heart attack Maternal Grandmother    Breast cancer Paternal Aunt        dx in her late 71s- early 51s   Melanoma Paternal 65    Breast cancer Paternal Aunt    Multiple myeloma Paternal 26    Other Daughter 61       ITP   the patient's father was diagnosed with squamous cell cancer of the throat at age 39 and died 54 years later. The patient's mother died from end-stage renal disease at age 4. The patient had one brother, 2 sisters. There is no cancer in the immediate family except as noted, but one of the patient's father's 5 sisters was diagnosed with breast cancer before the age of 72. Another 1 had multiple myeloma. On the patient's mother sides there is a history of brain tumor in a niece.    GYNECOLOGIC HISTORY:  Patient's last menstrual period was 11/21/2010. Menarche age 27, first live birth age 53. The patient is GX P2. She went through menopause in 2002 or thereabouts. She did not take hormone replacement. 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 Her husband [name] is a retired Clinical biochemist. Daughter Korra "Sharyn Lull" Inland lives in [place] and is a housewife. Daughter "[name]" and Dunlap this in [place]. 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 The patient has 2 grandchildren and 3 great-grandchildren. She is a Psychologist, forensic.    ADVANCED DIRECTIVES: In the absence of any documents to the contrary the patient's husband is her healthcare power of attorney  HEALTH MAINTENANCE: Social History   Tobacco Use   Smoking status: Former Smoker    Years: 4.00    Types: Cigarettes   Smokeless tobacco: Former Systems developer    Quit date: 05/15/1974   Tobacco comment: quit 35-40 years ago  Vaping Use   Vaping Use: Never used  Substance Use Topics   Alcohol  use: Yes    Alcohol/week: 3.0 standard drinks    Types: 3 Standard drinks or equivalent per week   Drug use: No     Colonoscopy: Cologuard 04/2020, negative  PAP: 02/2018, ASCUS  Bone density: October 2016 at [place], normal  Lipid panel:  Allergies  Allergen Reactions   Codeine Swelling    Facial/lips   Adhesive [Tape] Itching and Rash    MUST USE PAPER TAPE-CAUSES BLISTERS AND BRUISES ALSO   Latex Itching and Rash    Current Outpatient Medications  Medication Sig Dispense Refill   acetaminophen (TYLENOL) 500 MG tablet Take 500 mg by mouth every 6 (six) hours as needed for pain.     anastrozole (ARIMIDEX) 1 MG tablet Take 1 tablet by mouth once daily 90 tablet 0   atorvastatin (LIPITOR) 20 MG tablet Take 20 mg by mouth daily after breakfast.      CARTIA XT 120 MG 24 hr capsule Take 1 capsule by mouth once daily 90 capsule 3   cholecalciferol (VITAMIN D) 1000 units tablet Take 1,000 Units by mouth daily.     diclofenac Sodium (VOLTAREN) 1 % GEL diclofenac 1 % topical gel  APPLY AA QID     flecainide (TAMBOCOR) 100 MG tablet TAKE 1 TABLET(100 MG) BY MOUTH EVERY 12 HOURS 180 tablet 1   hydrochlorothiazide (HYDRODIURIL) 25 MG tablet Take 1 tablet (25 mg total) by mouth daily. Please call and schedule an appointment for further refills 2nd attempt 90 tablet 1   Omega-3 Fatty Acids (FISH OIL) 1000 MG CAPS Take 1,000 mg by mouth daily.     vitamin B-12 (CYANOCOBALAMIN) 1000 MCG tablet Take 1,000 mcg by mouth daily.     warfarin (COUMADIN) 5 MG tablet Take 1.5 tablets (7.5 mg total) by mouth daily. Resume on 05/30/2016     No current facility-administered medications for this visit.    OBJECTIVE: White woman who appears stated age  51:   10/23/20 1031  BP: (!) 137/59  Pulse: 82  Resp: 18  Temp: 97.7 F (36.5 C)  SpO2: 100%     Body mass index is 38.43 kg/m.    ECOG FS:1 - Symptomatic but completely ambulatory  Sclerae unicteric, EOMs  intact Wearing a mask No cervical or supraclavicular adenopathy Lungs no rales or rhonchi Heart regular rate and rhythm Abd soft, nontender, positive bowel sounds MSK no focal spinal tenderness, no upper extremity lymphedema Neuro: nonfocal, well oriented, appropriate affect Breasts: The right breast is status post lumpectomy and radiation.  There are the expected  changes particularly anteriorly and inferiorly laterally in the breast with skin coarsening and scar tissue.  The change she noticed however is in the upper outer quadrant.  This area is generally smooth but there is a very difficult to feel irregularity there, which I think does warrant further evaluation.  The left breast and both axillae are benign.   LAB RESULTS:  CMP     Component Value Date/Time   NA 141 07/22/2019 1104   NA 140 06/09/2017 1341   K 3.5 07/22/2019 1104   K 3.8 06/09/2017 1341   CL 99 07/22/2019 1104   CO2 27 07/22/2019 1104   CO2 28 06/09/2017 1341   GLUCOSE 89 07/22/2019 1104   GLUCOSE 101 (H) 06/21/2019 0952   GLUCOSE 94 06/09/2017 1341   BUN 14 07/22/2019 1104   BUN 13.9 06/09/2017 1341   CREATININE 0.72 07/22/2019 1104   CREATININE 0.78 06/21/2019 0952   CREATININE 0.7 06/09/2017 1341   CALCIUM 9.7 07/22/2019 1104   CALCIUM 9.7 06/09/2017 1341   PROT 7.8 06/21/2019 0952   PROT 7.6 06/09/2017 1341   ALBUMIN 4.0 06/21/2019 0952   ALBUMIN 3.8 06/09/2017 1341   AST 17 06/21/2019 0952   AST 27 06/09/2017 1341   ALT 19 06/21/2019 0952   ALT 34 06/09/2017 1341   ALKPHOS 166 (H) 06/21/2019 0952   ALKPHOS 157 (H) 06/09/2017 1341   BILITOT 0.6 06/21/2019 0952   BILITOT 0.69 06/09/2017 1341   GFRNONAA 86 07/22/2019 1104   GFRNONAA >60 06/21/2019 0952   GFRAA 100 07/22/2019 1104   GFRAA >60 06/21/2019 0952    INo results found for: SPEP, UPEP  Lab Results  Component Value Date   WBC 7.9 06/21/2019   NEUTROABS 5.4 06/21/2019   HGB 13.9 06/21/2019   HCT 41.9 06/21/2019   MCV 94.2  06/21/2019   PLT 228 06/21/2019      Chemistry      Component Value Date/Time   NA 141 07/22/2019 1104   NA 140 06/09/2017 1341   K 3.5 07/22/2019 1104   K 3.8 06/09/2017 1341   CL 99 07/22/2019 1104   CO2 27 07/22/2019 1104   CO2 28 06/09/2017 1341   BUN 14 07/22/2019 1104   BUN 13.9 06/09/2017 1341   CREATININE 0.72 07/22/2019 1104   CREATININE 0.78 06/21/2019 0952   CREATININE 0.7 06/09/2017 1341      Component Value Date/Time   CALCIUM 9.7 07/22/2019 1104   CALCIUM 9.7 06/09/2017 1341   ALKPHOS 166 (H) 06/21/2019 0952   ALKPHOS 157 (H) 06/09/2017 1341   AST 17 06/21/2019 0952   AST 27 06/09/2017 1341   ALT 19 06/21/2019 0952   ALT 34 06/09/2017 1341   BILITOT 0.6 06/21/2019 0952   BILITOT 0.69 06/09/2017 1341       No results found for: LABCA2  No components found for: [id number]  No results for input(s): INR in the last 168 hours.  Urinalysis    Component Value Date/Time   COLORURINE YELLOW 05/25/2016 [place] 05/25/2016 1645   LABSPEC 1.008 05/25/2016 1645   PHURINE 7.5 05/25/2016 [place] 05/25/2016 [place] 05/25/2016 [place] 12/26/2016 1108   [place] 05/25/2016 [place] 12/26/2016 1108   PROTEINUR NEGATIVE 05/25/2016 1645   UROBILINOGEN negative (A) 12/26/2016 1108   NITRITE N 12/26/2016 1108   NITRITE NEGATIVE 05/25/2016 1645   LEUKOCYTESUR Trace (A) 12/26/2016 1108  STUDIES: No results found.   ASSESSMENT: 70 y.o. [place] woman, status post right breast upper outer quadrant biopsy 04/18/2015 for a clinical T1b N0, stage IA invasive ductal carcinoma, grade 2, strongly estrogen and progesterone receptor positive, HER-2 not amplified, with an MIB-1 of 3%  (1) status post right lumpectomy and sentinel lymph node sampling 05/18/2015 for a  pT1c pN0, stage IA invasive ductal carcinoma, grade 1, repeat HER-2 again negative  (2) Oncotype DX score of 11 predicts an  outside the breast recurrence within 10 years of 7% if the patient's only systemic therapy is tamoxifen for 5 years. It also predicts no benefit from chemotherapy  (3) adjuvant radiation completed December 2017 thank you  (4) genetics testing 05/11/2015 through the Breast/Ovarian gene panel offered by [facility] found no deleterious mutations in ATM, BARD1, BRCA1, BRCA2, BRIP1, CDH1, CHEK2, EPCAM, FANCC, MLH1, MSH2, MSH6, NBN, PALB2, PMS2, PTEN, RAD51C, RAD51D, TP53, and XRCC2.  (5) started anastrozole NOV /2016  (a) DEXA scan at [facility] 05/30/2015 showed a T score of 1.0 (normal)  (b) anastrozole hed Jul;y 2017 due to arthralgias/ myalgias  (c) anastrozole resumed October 2017 as there was no change in symptoms off the drug  (d) anastrozole discontinued March 2022   PLAN: Shonice is now 5-1/2 years out from definitive surgery for her breast cancer with no evidence of disease recurrence.  This is very favorable.  She is completing 5 years of anastrozole.  We discussed the fact that in some patients who are at much higher risk we do go to 7 years but in her case I do not think that is necessary and anastrozole after all can cause problems with osteopenia and vaginal dryness so we are stopping the medication at this point.  I am setting her up for right mammogram and ultrasound to evaluate the change she has noted.  I do feel the results are going to be favorable and without in mind I releasing her from follow-up here.  She understands that we will be glad to see her again at any point in the future if and when the need arises  Total encounter time 20 minutes.*   Loralye Loberg, Courtney Dad, MD  10/23/20 10:33 AM Medical Oncology and Hematology [facility] [place], [place] [address] Tel. [phone]    Fax. [phone]   This document serves as a record of services personally performed by Lurline Del, MD. It was created on her behalf by Wilburn Mylar, a trained medical scribe. The creation of this record is based on the scribe's personal observations and the provider's statements to them. This document has been checked and approved by the attending provider.  I, Lurline Del MD, have reviewed the above documentation for accuracy and completeness, and I agree with the above.   *Total Encounter Time as defined by the Centers for Medicare and Medicaid Services includes, in addition to the face-to-face time of a patient visit (documented in the note above) non-face-to-face time: obtaining and reviewing outside history, ordering and reviewing medications, tests or procedures, care coordination (communications with other health care professionals or caregivers) and documentation in the medical record.

## 2020-10-23 ENCOUNTER — Other Ambulatory Visit: Payer: Self-pay

## 2020-10-23 ENCOUNTER — Inpatient Hospital Stay: Payer: Medicare PPO | Attending: Oncology | Admitting: Oncology

## 2020-10-23 VITALS — BP 137/59 | HR 82 | Temp 97.7°F | Resp 18 | Ht 62.0 in | Wt 210.1 lb

## 2020-10-23 DIAGNOSIS — C50411 Malignant neoplasm of upper-outer quadrant of right female breast: Secondary | ICD-10-CM

## 2020-10-23 DIAGNOSIS — Z923 Personal history of irradiation: Secondary | ICD-10-CM | POA: Diagnosis not present

## 2020-10-23 DIAGNOSIS — Z853 Personal history of malignant neoplasm of breast: Secondary | ICD-10-CM | POA: Diagnosis present

## 2020-10-23 DIAGNOSIS — Z170 Estrogen receptor positive status [ER+]: Secondary | ICD-10-CM

## 2020-10-24 ENCOUNTER — Other Ambulatory Visit: Payer: Self-pay

## 2020-10-24 DIAGNOSIS — I480 Paroxysmal atrial fibrillation: Secondary | ICD-10-CM

## 2020-10-24 MED ORDER — DILTIAZEM HCL ER COATED BEADS 120 MG PO CP24: 120.0000 mg | ORAL_CAPSULE | Freq: Every day | ORAL | 2 refills | Status: DC

## 2020-10-31 ENCOUNTER — Ambulatory Visit (INDEPENDENT_AMBULATORY_CARE_PROVIDER_SITE_OTHER): Payer: Medicare PPO

## 2020-10-31 DIAGNOSIS — I495 Sick sinus syndrome: Secondary | ICD-10-CM | POA: Diagnosis not present

## 2020-10-31 LAB — CUP PACEART REMOTE DEVICE CHECK
Battery Remaining Longevity: 148 mo
Battery Voltage: 3.03 V
Brady Statistic AP VP Percent: 0 %
Brady Statistic AP VS Percent: 51.5 %
Brady Statistic AS VP Percent: 0 %
Brady Statistic AS VS Percent: 48.5 %
Brady Statistic RA Percent Paced: 51.43 %
Brady Statistic RV Percent Paced: 0 %
Date Time Interrogation Session: 20220321201717
Implantable Lead Implant Date: 20190905
Implantable Lead Implant Date: 20190905
Implantable Lead Location: 753859
Implantable Lead Location: 753860
Implantable Lead Model: 3830
Implantable Lead Model: 5076
Implantable Pulse Generator Implant Date: 20190905
Lead Channel Impedance Value: 285 Ohm
Lead Channel Impedance Value: 323 Ohm
Lead Channel Impedance Value: 399 Ohm
Lead Channel Impedance Value: 399 Ohm
Lead Channel Pacing Threshold Amplitude: 0.75 V
Lead Channel Pacing Threshold Pulse Width: 0.4 ms
Lead Channel Sensing Intrinsic Amplitude: 1 mV
Lead Channel Sensing Intrinsic Amplitude: 1 mV
Lead Channel Sensing Intrinsic Amplitude: 5.375 mV
Lead Channel Sensing Intrinsic Amplitude: 5.375 mV
Lead Channel Setting Pacing Amplitude: 1.5 V
Lead Channel Setting Pacing Amplitude: 2.5 V
Lead Channel Setting Pacing Pulse Width: 0.4 ms
Lead Channel Setting Sensing Sensitivity: 0.9 mV

## 2020-11-08 NOTE — Progress Notes (Signed)
Remote pacemaker transmission.   

## 2021-01-13 ENCOUNTER — Other Ambulatory Visit: Payer: Self-pay | Admitting: Cardiology

## 2021-01-13 DIAGNOSIS — I480 Paroxysmal atrial fibrillation: Secondary | ICD-10-CM

## 2021-01-30 ENCOUNTER — Ambulatory Visit (INDEPENDENT_AMBULATORY_CARE_PROVIDER_SITE_OTHER): Payer: Medicare PPO

## 2021-01-30 DIAGNOSIS — I495 Sick sinus syndrome: Secondary | ICD-10-CM

## 2021-01-31 LAB — CUP PACEART REMOTE DEVICE CHECK
Battery Remaining Longevity: 143 mo
Battery Voltage: 3.02 V
Brady Statistic AP VP Percent: 0.01 %
Brady Statistic AP VS Percent: 57.97 %
Brady Statistic AS VP Percent: 0 %
Brady Statistic AS VS Percent: 42.02 %
Brady Statistic RA Percent Paced: 57.91 %
Brady Statistic RV Percent Paced: 0.01 %
Date Time Interrogation Session: 20220621035801
Implantable Lead Implant Date: 20190905
Implantable Lead Implant Date: 20190905
Implantable Lead Location: 753859
Implantable Lead Location: 753860
Implantable Lead Model: 3830
Implantable Lead Model: 5076
Implantable Pulse Generator Implant Date: 20190905
Lead Channel Impedance Value: 285 Ohm
Lead Channel Impedance Value: 304 Ohm
Lead Channel Impedance Value: 380 Ohm
Lead Channel Impedance Value: 399 Ohm
Lead Channel Pacing Threshold Amplitude: 0.875 V
Lead Channel Pacing Threshold Pulse Width: 0.4 ms
Lead Channel Sensing Intrinsic Amplitude: 1.625 mV
Lead Channel Sensing Intrinsic Amplitude: 1.625 mV
Lead Channel Sensing Intrinsic Amplitude: 5.5 mV
Lead Channel Sensing Intrinsic Amplitude: 5.5 mV
Lead Channel Setting Pacing Amplitude: 1.75 V
Lead Channel Setting Pacing Amplitude: 2.5 V
Lead Channel Setting Pacing Pulse Width: 0.4 ms
Lead Channel Setting Sensing Sensitivity: 0.9 mV

## 2021-02-02 ENCOUNTER — Telehealth: Payer: Self-pay | Admitting: Internal Medicine

## 2021-02-02 NOTE — Telephone Encounter (Signed)
Pt called in stating that her pace maker is showing some inconsistent readings pt is concerned that something is wrong please follow up

## 2021-02-02 NOTE — Telephone Encounter (Signed)
Returned patients phone call. Patient reports her device felt "funny yesterday." Reports she experienced a few palpitations during event. Blood pressure was 722 systolic. Reports of 2 BM's right after palpitations, blood pressure then 575 systolic. All happened around the same time. Denies chest pain, shortness of breath, dizziness or lightheaded. Unable to send transmission d/t technical difficulties. Advised to call [employer] for assistance with the app at [phone]. Requested patient call us back with update after call made. Direct phone number given to patient with verbal understanding.

## 2021-02-07 NOTE — Telephone Encounter (Signed)
Patient reports she is feeling "okay", states she is not 100% but better. States where her device is sometimes she feels a "loop" in her chest. Reports her blood pressure is back to normal. No transmission was received. Patient not at home to send but will send later today.

## 2021-02-15 NOTE — Telephone Encounter (Signed)
Spoke with pt.  Transmission still has not been received.  Attempted to assist pt with sending a transmission.  Unfortunately that was unsuccessful, recommended patient call [employer] tech support.  Also advised pt that if her device was picking up an arrhythmia that was concerning we would receive an alert which we haven't.  Pt reports feeling ok lately, she has felt some paplpiatations but did not seem over concerned  Advised patient I am closing out phone call, if she is worried about the feeling she is having she will call [employer] tech support for assistance with the app.

## 2021-02-19 NOTE — Progress Notes (Signed)
Remote pacemaker transmission.   

## 2021-04-03 ENCOUNTER — Encounter (HOSPITAL_BASED_OUTPATIENT_CLINIC_OR_DEPARTMENT_OTHER): Payer: Self-pay | Admitting: Obstetrics & Gynecology

## 2021-04-03 DIAGNOSIS — Z170 Estrogen receptor positive status [ER+]: Secondary | ICD-10-CM

## 2021-04-03 DIAGNOSIS — C50411 Malignant neoplasm of upper-outer quadrant of right female breast: Secondary | ICD-10-CM

## 2021-04-19 ENCOUNTER — Other Ambulatory Visit: Payer: Self-pay | Admitting: Internal Medicine

## 2021-04-19 DIAGNOSIS — I480 Paroxysmal atrial fibrillation: Secondary | ICD-10-CM

## 2021-05-01 ENCOUNTER — Ambulatory Visit (INDEPENDENT_AMBULATORY_CARE_PROVIDER_SITE_OTHER): Payer: Medicare PPO

## 2021-05-01 DIAGNOSIS — I495 Sick sinus syndrome: Secondary | ICD-10-CM

## 2021-05-01 LAB — CUP PACEART REMOTE DEVICE CHECK
Battery Remaining Longevity: 142 mo
Battery Voltage: 3.02 V
Brady Statistic AP VP Percent: 0.01 %
Brady Statistic AP VS Percent: 60.45 %
Brady Statistic AS VP Percent: 0 %
Brady Statistic AS VS Percent: 39.54 %
Brady Statistic RA Percent Paced: 60.42 %
Brady Statistic RV Percent Paced: 0.01 %
Date Time Interrogation Session: 20220920005702
Implantable Lead Implant Date: 20190905
Implantable Lead Implant Date: 20190905
Implantable Lead Location: 753859
Implantable Lead Location: 753860
Implantable Lead Model: 3830
Implantable Lead Model: 5076
Implantable Pulse Generator Implant Date: 20190905
Lead Channel Impedance Value: 323 Ohm
Lead Channel Impedance Value: 399 Ohm
Lead Channel Impedance Value: 418 Ohm
Lead Channel Impedance Value: 456 Ohm
Lead Channel Pacing Threshold Amplitude: 0.75 V
Lead Channel Pacing Threshold Pulse Width: 0.4 ms
Lead Channel Sensing Intrinsic Amplitude: 1.125 mV
Lead Channel Sensing Intrinsic Amplitude: 1.125 mV
Lead Channel Sensing Intrinsic Amplitude: 6 mV
Lead Channel Sensing Intrinsic Amplitude: 6 mV
Lead Channel Setting Pacing Amplitude: 1.5 V
Lead Channel Setting Pacing Amplitude: 2.5 V
Lead Channel Setting Pacing Pulse Width: 0.4 ms
Lead Channel Setting Sensing Sensitivity: 0.9 mV

## 2021-05-08 NOTE — Progress Notes (Signed)
Remote pacemaker transmission.   

## 2021-07-31 ENCOUNTER — Ambulatory Visit (INDEPENDENT_AMBULATORY_CARE_PROVIDER_SITE_OTHER): Payer: Medicare PPO

## 2021-07-31 DIAGNOSIS — I495 Sick sinus syndrome: Secondary | ICD-10-CM | POA: Diagnosis not present

## 2021-07-31 LAB — CUP PACEART REMOTE DEVICE CHECK
Battery Remaining Longevity: 136 mo
Battery Voltage: 3.02 V
Brady Statistic AP VP Percent: 0.01 %
Brady Statistic AP VS Percent: 53.53 %
Brady Statistic AS VP Percent: 0 %
Brady Statistic AS VS Percent: 46.46 %
Brady Statistic RA Percent Paced: 53.43 %
Brady Statistic RV Percent Paced: 0.01 %
Date Time Interrogation Session: 20221219193809
Implantable Lead Implant Date: 20190905
Implantable Lead Implant Date: 20190905
Implantable Lead Location: 753859
Implantable Lead Location: 753860
Implantable Lead Model: 3830
Implantable Lead Model: 5076
Implantable Pulse Generator Implant Date: 20190905
Lead Channel Impedance Value: 304 Ohm
Lead Channel Impedance Value: 342 Ohm
Lead Channel Impedance Value: 399 Ohm
Lead Channel Impedance Value: 418 Ohm
Lead Channel Pacing Threshold Amplitude: 0.875 V
Lead Channel Pacing Threshold Pulse Width: 0.4 ms
Lead Channel Sensing Intrinsic Amplitude: 0.875 mV
Lead Channel Sensing Intrinsic Amplitude: 0.875 mV
Lead Channel Sensing Intrinsic Amplitude: 5.875 mV
Lead Channel Sensing Intrinsic Amplitude: 5.875 mV
Lead Channel Setting Pacing Amplitude: 1.75 V
Lead Channel Setting Pacing Amplitude: 2.5 V
Lead Channel Setting Pacing Pulse Width: 0.4 ms
Lead Channel Setting Sensing Sensitivity: 0.9 mV

## 2021-08-01 ENCOUNTER — Encounter (HOSPITAL_BASED_OUTPATIENT_CLINIC_OR_DEPARTMENT_OTHER): Payer: Self-pay | Admitting: *Deleted

## 2021-08-09 ENCOUNTER — Other Ambulatory Visit: Payer: Self-pay | Admitting: Cardiology

## 2021-08-09 DIAGNOSIS — I480 Paroxysmal atrial fibrillation: Secondary | ICD-10-CM

## 2021-08-09 NOTE — Progress Notes (Signed)
Remote pacemaker transmission.   

## 2021-09-12 ENCOUNTER — Ambulatory Visit (HOSPITAL_BASED_OUTPATIENT_CLINIC_OR_DEPARTMENT_OTHER): Payer: Medicare PPO | Admitting: Obstetrics & Gynecology

## 2021-09-19 ENCOUNTER — Telehealth: Payer: Self-pay | Admitting: Internal Medicine

## 2021-09-19 ENCOUNTER — Telehealth (HOSPITAL_BASED_OUTPATIENT_CLINIC_OR_DEPARTMENT_OTHER): Payer: Self-pay | Admitting: *Deleted

## 2021-09-19 NOTE — Telephone Encounter (Signed)
New message   Pt c/o Shortness Of Breath: STAT if SOB developed within the last 24 hours or pt is noticeably SOB on the phone  1. Are you currently SOB (can you hear that pt is SOB on the phone)? yes  2. How long have you been experiencing SOB? Since 4am, husband called EMS for pt   3. Are you SOB when sitting or when up moving around? Move around  4. Are you currently experiencing any other symptoms? BP was up when EMS was there, pt can feel heart beating harder

## 2021-09-19 NOTE — Telephone Encounter (Signed)
Pt left message on nurse line stating that she had been experiencing some bright red bleeding coming from her vagina when she wipes. Pt offered an appt tomorrow but already has one scheduled with cardiology. Pt will come in on Friday for evaluation.

## 2021-09-19 NOTE — Telephone Encounter (Signed)
Call sent start to triage. Patient complailning of fast heart beat (80's 90's), SOB, elevated BP 162/74, lightheaded, and dizzy. Patient has history of A. FIB. Patient has a pacemaker as well. Patient sees Dr. Rayann Heman for EP and Dr. Radford Pax is her primary cardiologist. Patient stated it all happen this morning around 4:00 am she woke up to go to the bathroom and started having symptoms. Patient was also shaky at the time. Patient's husband called 60 and EMS came out to the house. Patient was assessed and advised to follow up with cardiology. They discouraged patient from going to hospital, due to it being backed up. Informed patient that a message would be sent to Dr. Rayann Heman, Dr. Radford Pax, and [facility] to see if they have any additional advise. At this time patient has appointment tomorrow with DOD, Dr. Marlou Porch. Encourage patient to go to ED if symptoms get worse.

## 2021-09-19 NOTE — Progress Notes (Signed)
Cardiology Office Note:    Date:  09/20/2021   ID:  Courtney Dunlap, DOB [birth year], MRN [id number]  PCP:  Reynold Bowen, MD   [facility] Providers Cardiologist:  Candee Furbish, MD     Referring MD: Reynold Bowen, MD   History of Present Illness:    Courtney Dunlap is a 71 y.o. female here for the follow-up of shortness of breath and racing heart beats.  She called the office 09/19/2021 complaining of fast heart beats (80's, 90's), SOB, elevated BP (162/74), lightheadedness and dizziness. EMS was called and she was advised to follow up with cardiology.   Last seen in Cardiology 07/31/2020 by Dr. Rayann Heman. She was doing well at that time. EKG showed sinus with rbbb, lahb. Her Afib burden by PPM was 0 %. On coumadin and flecainide. May have reduced flecainide to 50 mg BID, but she did not wish to make any changes.  Medtronic Azure XT DR MRI SureScan PPM implanted by Dr Rayann Heman for afib with post termination pauses/ sick sinus syndrome 04/16/2018.  SVT likely due to atrial flutter was successfully ablated by Dr. Lovena Le 05/2016. Ablation also performed in 06/2016 by Dr. Delilah Shan.  Today: Overall, she appears well. However, yesterday she woke up at 3:30 AM and felt her heart racing. She was concerned that she was in Afib.   Also, she becomes winded frequently. Every now and then she feels a sharp, "little" pain in her right temple.  She denies any chest pain. No lightheadedness, headaches, syncope, orthopnea, PND, lower extremity edema or exertional symptoms.   Past Medical History:  Diagnosis Date   Allergic dermatitis    Anxiety    Arthritis    back, knees & ankles    Atrial flutter ([place]) 05/25/2016   Benign essential HTN 02/04/2014   Breast cancer ([place])    Cancer ([place])    skin- lower abdomen   Depression    Dyslipidemia 08/16/2011   Family history of breast cancer    Genetic testing 05/12/2015   Negative genetic testing on the Breast/Ovarian cancer panel.  The Breast/Ovarian gene  panel offered by [facility] includes sequencing and rearrangement analysis for the following 20 genes:  ATM, BARD1, BRCA1, BRCA2, BRIP1, CDH1, CHEK2, EPCAM, FANCC, MLH1, MSH2, MSH6, NBN, PALB2, PMS2, PTEN, RAD51C, RAD51D, TP53, and XRCC2.   The report date is May 11, 2015.    GERD (gastroesophageal reflux disease)    Hiatal hernia    with GERD   Hypercholesteremia    Hyperthyroidism    pt. reports that she thinks she has hypothyroidism, states MD- [place] took her off med. in the past 1-2 yrs.    Malignant neoplasm of upper-outer quadrant of right breast in female, estrogen receptor positive ([place]) 04/24/2015   Mild cognitive impairment with memory loss    Morbid obesity ([place])    OSA (obstructive sleep apnea) 02/04/2014   Persistent atrial fibrillation (HCC)    Sleep apnea    severe with AHI 37/hr now on CPAP at 13cm H2O   SVT (supraventricular tachycardia) ([place])    likely due to atrial flutter.  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 Delilah Shan   APPENDECTOMY  28 years ago   [place]    Polyp resection    ELECTROPHYSIOLOGIC STUDY N/A 05/27/2016   Procedure: EPS/SVT Ablation;  Surgeon: Evans Lance, MD;  Location: [place] [facility];  Service: Cardiovascular;  Laterality: N/A;   HYSTEROSCOPY  4/12   D&C   PACEMAKER IMPLANT N/A 04/16/2018   Medtronic Azure XT DR MRI SureScan PPM implanted by Dr Rayann Heman for afib with post termination pauses/ sick sinus syndrome   RADIOACTIVE SEED GUIDED PARTIAL MASTECTOMY WITH AXILLARY SENTINEL LYMPH NODE BIOPSY Right 05/18/2015   Procedure: RIGHT BREAST RADIOACTIVE SEED PARTIAL MASTECTOMY WITH RIGHT SENTINEL LYMPH NODE MAPPING;  Surgeon: Erroll Luna, MD;  Location: [facility];  Service: General;  Laterality: Right;   TONSILLECTOMY     TUBAL LIGATION      Current  Medications: Current Meds  Medication Sig   acetaminophen (TYLENOL) 500 MG tablet Take 500 mg by mouth every 6 (six) hours as needed for pain.   atorvastatin (LIPITOR) 20 MG tablet Take 20 mg by mouth daily after breakfast.    cholecalciferol (VITAMIN D) 1000 units tablet Take 1,000 Units by mouth daily.   diclofenac Sodium (VOLTAREN) 1 % GEL diclofenac 1 % topical gel  APPLY AA QID   diltiazem (CARDIZEM CD) 120 MG 24 hr capsule TAKE 1 CAPSULE(120 MG) BY MOUTH DAILY   flecainide (TAMBOCOR) 100 MG tablet Take 1 tablet (100 mg total) by mouth 2 (two) times daily. Please make yearly appt with Dr. Radford Pax for February 2023 for future refills. Thank you 1st attempt   hydrochlorothiazide (HYDRODIURIL) 25 MG tablet Take 1 tablet (25 mg total) by mouth daily. Please call and schedule an appointment for further refills 2nd attempt   Omega-3 Fatty Acids (FISH OIL) 1000 MG CAPS Take 1,000 mg by mouth daily.   vitamin B-12 (CYANOCOBALAMIN) 1000 MCG tablet Take 1,000 mcg by mouth daily.   warfarin (COUMADIN) 5 MG tablet Take 1.5 tablets (7.5 mg total) by mouth daily. Resume on 05/30/2016     Allergies:   Codeine, Adhesive [tape], and Latex   Social History   Socioeconomic History   Marital status: Married    Spouse name: Not on file   Number of children: 2   Years of education: Not on file   Highest education level: Not on file  Occupational History    Employer: [employer]  Tobacco Use   Smoking status: Former    Years: 4.00    Types: Cigarettes   Smokeless tobacco: Former    Quit date: 05/15/1974   Tobacco comments:    quit 35-40 years ago  Vaping Use   Vaping Use: Never used  Substance and Sexual Activity   Alcohol use: Yes    Alcohol/week: 3.0 standard drinks    Types: 3 Standard drinks or equivalent per week   Drug use: No   Sexual activity: Not Currently    Birth control/protection: Post-menopausal  Other Topics Concern   Not on file  Social History Narrative   Not  on file   Social Determinants of Health   Financial Resource Strain: Not on file  Food Insecurity: Not on file  Transportation Needs: Not on file  Physical Activity: Not on file  Stress: Not on file  Social Connections: Not on file     Family History: The patient's family history includes Brain cancer in her mother; Breast cancer in her paternal aunt and paternal aunt; Diabetes in her maternal grandmother; Heart attack in her maternal grandmother; Hypertension in her brother, mother, sister, and sister; Melanoma in her paternal aunt; Multiple myeloma in her paternal aunt; Other (age of onset: 65) in her daughter;  Thyroid disease in her brother and sister.  ROS:   Please see the history of present illness.    (+) Palpitations (+) Shortness of breath (+) Sharp, mild pain in right temple All other systems reviewed and are negative.  EKGs/Labs/Other Studies Reviewed:    The following studies were reviewed today:  Pacemaker Implant 04/16/2018: CONCLUSIONS:   1. Successful implantation of a Medtronic Azure XT MRI conditional dual-chamber pacemaker for sick sinus syndrome with post termination pauses and presyncope  2. No early apparent complications.   Echo 04/16/2018: Study Conclusions   - Left ventricle: The cavity size was normal. Systolic function was    vigorous. The estimated ejection fraction was in the range of 65%    to 70%. Wall motion was normal; there were no regional wall    motion abnormalities. Doppler parameters are consistent with    abnormal left ventricular relaxation (grade 1 diastolic    dysfunction). There was no evidence of elevated ventricular    filling pressure by Doppler parameters.  - Aortic valve: There was no regurgitation.  - Aortic root: The aortic root was normal in size.  - Mitral valve: There was mild regurgitation.  - Left atrium: The atrium was mildly dilated.  - Right atrium: The atrium was normal in size.  - Tricuspid valve: There was no  regurgitation.  - Pulmonic valve: There was no regurgitation.  - Pulmonary arteries: Systolic pressure was within the normal    range.  - Inferior vena cava: The vessel was normal in size.  - Pericardium, extracardiac: There was no pericardial effusion.   Monitor 03/2018: Normal sinus rhythm with average heart rate 64bpm Significant wandering baseline artifact  Lexiscan Myoview 05/04/2015: Nuclear stress EF: 61%. There was no ST segment deviation noted during stress. The study is normal. No ischemia. No infarction. This is a low risk study.  EKG:  EKG is personally reviewed and interpreted. 09/20/2021: Sinus rhythm. Rate 85 bpm. RBBB. LAFB.  Recent Labs: No results found for requested labs within last 8760 hours.   Recent Lipid Panel No results found for: CHOL, TRIG, HDL, CHOLHDL, VLDL, LDLCALC, LDLDIRECT   Risk Assessment/Calculations:          Physical Exam:    VS:  BP 140/70 (BP Location: Left Arm, Patient Position: Sitting, Cuff Size: Normal)    Pulse 85    Ht '5\' 2"'  (1.575 m)    Wt 203 lb (92.1 kg)    LMP 11/21/2010 Comment: spotting-had hysteroscopy/d&c   SpO2 93%    BMI 37.13 kg/m     Wt Readings from Last 3 Encounters:  09/20/21 203 lb (92.1 kg)  10/23/20 210 lb 1.6 oz (95.3 kg)  07/31/20 201 lb 3.2 oz (91.3 kg)     GEN: Well nourished, well developed in no acute distress HEENT: Normal NECK: No JVD; No carotid bruits LYMPHATICS: No lymphadenopathy CARDIAC: RRR, no murmurs, rubs, gallops RESPIRATORY:  Clear to auscultation without rales, wheezing or rhonchi  ABDOMEN: Soft, non-tender, non-distended MUSCULOSKELETAL:  No edema; No deformity  SKIN: Warm and dry NEUROLOGIC:  Alert and oriented x 3 PSYCHIATRIC:  Normal affect   ASSESSMENT:    1. Tachycardia-bradycardia syndrome ([place])   2. Persistent atrial fibrillation ([place])   3. Hypercoagulable state due to atrial fibrillation ([place])   4. Pacemaker, Medtronic 2019    PLAN:    In order of problems listed  above:  Tachycardia-bradycardia syndrome ([place]) Overall doing well currently today.  EKG reassuring.  Sinus rhythm.  Pacemaker interrogation  from December 2022 personally reviewed and interpreted demonstrates no evidence of atrial fibrillation.  Battery longevity 11 years.  Reassurance given to her.  Persistent atrial fibrillation (HCC) Currently maintaining sinus rhythm.  Pacemaker interrogation reviewed.  Hypercoagulable state due to atrial fibrillation ([place]) Continue with warfarin as directed.  No change in high risk medication management.  Pacemaker, Medtronic 2019 Interrogation reports reviewed.  Overall doing quite well.  She has coming up with Oda Kilts.  Remote device checks noted.  We had our device team come by today to counsel her on her Medtronic app for her smart phone.  She had questions about it.       Follow-up: PRN.  Medication Adjustments/Labs and Tests Ordered: Current medicines are reviewed at length with the patient today.  Concerns regarding medicines are outlined above.   No orders of the defined types were placed in this encounter.  No orders of the defined types were placed in this encounter.  Patient Instructions  Medication Instructions:  The current medical regimen is effective;  continue present plan and medications.  *If you need a refill on your cardiac medications before your next appointment, please call your pharmacy*  Follow-Up: At [facility], you and your health needs are our priority.  As part of our continuing mission to provide you with exceptional heart care, we have created designated Provider Care Teams.  These Care Teams include your primary Cardiologist (physician) and Advanced Practice Providers (APPs -  Physician Assistants and Nurse Practitioners) who all work together to provide you with the care you need, when you need it.  We recommend signing up for the patient portal called "MyChart".  Sign up information is provided on  this After Visit Summary.  MyChart is used to connect with patients for Virtual Visits (Telemedicine).  Patients are able to view lab/test results, encounter notes, upcoming appointments, etc.  Non-urgent messages can be sent to your provider as well.   To learn more about what you can do with MyChart, go to [email or web address].    Your next appointment:   Follow up with EP as scheduled.  Thank you for choosing [facility]!!      I,Mathew Stumpf,acting as a scribe for Candee Furbish, MD.,have documented all relevant documentation on the behalf of Candee Furbish, MD,as directed by  Candee Furbish, MD while in the presence of Candee Furbish, MD.  I, Candee Furbish, MD, have reviewed all documentation for this visit. The documentation on 09/20/21 for the exam, diagnosis, procedures, and orders are all accurate and complete.   Signed, Candee Furbish, MD  09/20/2021 12:08 PM    [place]

## 2021-09-19 NOTE — Telephone Encounter (Signed)
Called patient to send remote transmission.  Patient has app on her phone and attempting to send now. Will call back after sent.

## 2021-09-19 NOTE — Telephone Encounter (Signed)
I called patient to try and help send transmission with the help of tech support. We were unable to get one to come through, the patient is coming in tomorrow and we can assist her how to use it then.

## 2021-09-20 ENCOUNTER — Encounter: Payer: Self-pay | Admitting: Cardiology

## 2021-09-20 ENCOUNTER — Other Ambulatory Visit: Payer: Self-pay

## 2021-09-20 ENCOUNTER — Ambulatory Visit: Payer: Medicare PPO | Admitting: Cardiology

## 2021-09-20 DIAGNOSIS — Z950 Presence of cardiac pacemaker: Secondary | ICD-10-CM

## 2021-09-20 DIAGNOSIS — D6869 Other thrombophilia: Secondary | ICD-10-CM | POA: Diagnosis not present

## 2021-09-20 DIAGNOSIS — I4891 Unspecified atrial fibrillation: Secondary | ICD-10-CM

## 2021-09-20 DIAGNOSIS — I495 Sick sinus syndrome: Secondary | ICD-10-CM | POA: Diagnosis not present

## 2021-09-20 DIAGNOSIS — I4819 Other persistent atrial fibrillation: Secondary | ICD-10-CM

## 2021-09-20 NOTE — Patient Instructions (Signed)
Medication Instructions:  The current medical regimen is effective;  continue present plan and medications.  *If you need a refill on your cardiac medications before your next appointment, please call your pharmacy*  Follow-Up: At [facility], you and your health needs are our priority.  As part of our continuing mission to provide you with exceptional heart care, we have created designated Provider Care Teams.  These Care Teams include your primary Cardiologist (physician) and Advanced Practice Providers (APPs -  Physician Assistants and Nurse Practitioners) who all work together to provide you with the care you need, when you need it.  We recommend signing up for the patient portal called "MyChart".  Sign up information is provided on this After Visit Summary.  MyChart is used to connect with patients for Virtual Visits (Telemedicine).  Patients are able to view lab/test results, encounter notes, upcoming appointments, etc.  Non-urgent messages can be sent to your provider as well.   To learn more about what you can do with MyChart, go to [email or web address].    Your next appointment:   Follow up with EP as scheduled.  Thank you for choosing [facility]!!

## 2021-09-20 NOTE — Assessment & Plan Note (Signed)
Continue with warfarin as directed.  No change in high risk medication management.

## 2021-09-20 NOTE — Assessment & Plan Note (Signed)
Currently maintaining sinus rhythm.  Pacemaker interrogation reviewed.

## 2021-09-20 NOTE — Assessment & Plan Note (Signed)
Overall doing well currently today.  EKG reassuring.  Sinus rhythm.  Pacemaker interrogation from December 2022 personally reviewed and interpreted demonstrates no evidence of atrial fibrillation.  Battery longevity 11 years.  Reassurance given to her.

## 2021-09-20 NOTE — Assessment & Plan Note (Signed)
Interrogation reports reviewed.  Overall doing quite well.  She has coming up with Courtney Dunlap.  Remote device checks noted.  We had our device team come by today to counsel her on her Medtronic app for her smart phone.  She had questions about it.

## 2021-09-21 ENCOUNTER — Ambulatory Visit (HOSPITAL_BASED_OUTPATIENT_CLINIC_OR_DEPARTMENT_OTHER): Payer: Medicare PPO | Admitting: Obstetrics & Gynecology

## 2021-09-21 ENCOUNTER — Other Ambulatory Visit (HOSPITAL_COMMUNITY)
Admission: RE | Admit: 2021-09-21 | Discharge: 2021-09-21 | Disposition: A | Discharge status: Home / Self Care | Payer: Medicare PPO | Source: Ambulatory Visit | Attending: Obstetrics & Gynecology | Admitting: Obstetrics & Gynecology

## 2021-09-21 ENCOUNTER — Encounter (HOSPITAL_BASED_OUTPATIENT_CLINIC_OR_DEPARTMENT_OTHER): Payer: Self-pay | Admitting: Obstetrics & Gynecology

## 2021-09-21 VITALS — BP 154/69 | HR 68 | Ht 62.0 in | Wt 202.6 lb

## 2021-09-21 DIAGNOSIS — N950 Postmenopausal bleeding: Secondary | ICD-10-CM | POA: Diagnosis not present

## 2021-09-21 DIAGNOSIS — Z01411 Encounter for gynecological examination (general) (routine) with abnormal findings: Secondary | ICD-10-CM | POA: Insufficient documentation

## 2021-09-21 DIAGNOSIS — Z124 Encounter for screening for malignant neoplasm of cervix: Secondary | ICD-10-CM | POA: Insufficient documentation

## 2021-09-21 NOTE — Patient Instructions (Signed)
Today, I did an exam which was normal.  I did obtain a pap smear to be sure the cells of the cervix are normal.  I obtained a biopsy of inside the uterus (endometrial biopsy) to ensure there are no abnormal cells causing the bleeding.  If all of the above is normal, I'm going to have you return for an ultrasound to make sure there are no endometrial polyps (which are almost always benign) and to look at the ovaries more in depth.

## 2021-09-21 NOTE — Progress Notes (Signed)
71 y.o. G36P2002 Married White or Caucasian female here for complaint of PMP bleeding this past week.  It started out as bright red but has decreased.  Occurred after "being frisky" so wonders if related.  Denies any pelvic cramping or low back pain.    Has been menopausal for about 10 years.  Not on HRT.   Desires breast exam today.   reports that she quit smoking about 47 years ago. Her smoking use included cigarettes. She has never used smokeless tobacco. She reports current alcohol use of about 3.0 standard drinks per week. She reports that she does not use drugs.  Past Medical History:  Diagnosis Date   Allergic dermatitis    Anxiety    Arthritis    back, knees & ankles    Atrial flutter ([place]) 05/25/2016   Benign essential HTN 02/04/2014   Breast cancer ([place])    Cancer ([place])    skin- lower abdomen   Depression    Dyslipidemia 08/16/2011   Family history of breast cancer    Genetic testing 05/12/2015   Negative genetic testing on the Breast/Ovarian cancer panel.  The Breast/Ovarian gene panel offered by [facility] includes sequencing and rearrangement analysis for the following 20 genes:  ATM, BARD1, BRCA1, BRCA2, BRIP1, CDH1, CHEK2, EPCAM, FANCC, MLH1, MSH2, MSH6, NBN, PALB2, PMS2, PTEN, RAD51C, RAD51D, TP53, and XRCC2.   The report date is May 11, 2015.    GERD (gastroesophageal reflux disease)    Hiatal hernia    with GERD   Hypercholesteremia    Hyperthyroidism    pt. reports that she thinks she has hypothyroidism, states MD- [place] took her off med. in the past 1-2 yrs.    Malignant neoplasm of upper-outer quadrant of right breast in female, estrogen receptor positive ([place]) 04/24/2015   Mild cognitive impairment with memory loss    Morbid obesity ([place])    OSA (obstructive sleep apnea) 02/04/2014   Persistent atrial fibrillation (HCC)    Sleep apnea    severe with AHI 37/hr now on CPAP at 13cm H2O   SVT (supraventricular tachycardia) ([place])    likely due to atrial flutter.   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 Delilah Shan   APPENDECTOMY  28 years ago   [place]    Polyp resection    ELECTROPHYSIOLOGIC STUDY N/A 05/27/2016   Procedure: EPS/SVT Ablation;  Surgeon: Evans Lance, MD;  Location: [facility];  Service: Cardiovascular;  Laterality: N/A;   HYSTEROSCOPY  4/12   D&C   PACEMAKER IMPLANT N/A 04/16/2018   Medtronic Azure XT DR MRI SureScan PPM implanted by Dr Rayann Heman for afib with post termination pauses/ sick sinus syndrome   RADIOACTIVE SEED GUIDED PARTIAL MASTECTOMY WITH AXILLARY SENTINEL LYMPH NODE BIOPSY Right 05/18/2015   Procedure: RIGHT BREAST RADIOACTIVE SEED PARTIAL MASTECTOMY WITH RIGHT SENTINEL LYMPH NODE MAPPING;  Surgeon: Erroll Luna, MD;  Location: [facility];  Service: General;  Laterality: Right;   TONSILLECTOMY     TUBAL LIGATION      Current Outpatient Medications  Medication Sig Dispense Refill   acetaminophen (TYLENOL) 500 MG tablet Take 500 mg by mouth every 6 (six) hours as needed for pain.     atorvastatin (LIPITOR) 20 MG tablet Take 20 mg by mouth daily after breakfast.  cholecalciferol (VITAMIN D) 1000 units tablet Take 1,000 Units by mouth daily.     diclofenac Sodium (VOLTAREN) 1 % GEL diclofenac 1 % topical gel  APPLY AA QID     diltiazem (CARDIZEM CD) 120 MG 24 hr capsule TAKE 1 CAPSULE(120 MG) BY MOUTH DAILY 90 capsule 2   flecainide (TAMBOCOR) 100 MG tablet Take 1 tablet (100 mg total) by mouth 2 (two) times daily. Please make yearly appt with Dr. Radford Pax for February 2023 for future refills. Thank you 1st attempt 180 tablet 0   hydrochlorothiazide (HYDRODIURIL) 25 MG tablet Take 1 tablet (25 mg total) by mouth daily. Please call and schedule an appointment for further refills 2nd attempt 90 tablet 1   Omega-3 Fatty Acids (FISH OIL) 1000 MG  CAPS Take 1,000 mg by mouth daily.     vitamin B-12 (CYANOCOBALAMIN) 1000 MCG tablet Take 1,000 mcg by mouth daily.     warfarin (COUMADIN) 5 MG tablet Take 1.5 tablets (7.5 mg total) by mouth daily. Resume on 05/30/2016     No current facility-administered medications for this visit.    Family History  Problem Relation Age of Onset   Hypertension Mother    Brain cancer Mother    Hypertension Sister    Thyroid disease Sister    Hypertension Sister    Hypertension Brother    Thyroid disease Brother    Diabetes Maternal Grandmother    Heart attack Maternal Grandmother    Breast cancer Paternal Aunt        dx in her late 68s- early 37s   Melanoma Paternal 36    Breast cancer Paternal Aunt    Multiple myeloma Paternal Aunt    Other Daughter 71       ITP    Review of Systems  Constitutional: Negative.   Genitourinary:        Vaginal bleeding   Exam:   Ht _0  (1.575 m)    Wt 202 lb 9.6 oz (91.9 kg)    LMP 11/21/2010 Comment: spotting-had hysteroscopy/d&c   BMI 37.06 kg/m   Height: _1  (157.5 cm)  General appearance: alert, cooperative and appears stated age Breasts: right breast with thickened breast tissues and skin changes c/w radiation changes, no masses, no nipple discharge, no LAD;  changes are stable; left breast without masses, skin changes. LAD Abdomen: soft, non-tender; bowel sounds normal; no masses,  no organomegaly Lymph nodes: No abnormal inguinal nodes palpated  Pelvic: External genitalia:  no lesions              Urethra:  normal appearing urethra with no masses, tenderness or lesions              Bartholins and Skenes: normal                 Vagina: normal appearing vagina with atrophic changes and no discharge, no lesions              Cervix: no lesions, blood at os present              Pap taken: Yes.   Bimanual Exam:  Uterus:  normal size, contour, position, consistency, mobility, non-tender              Adnexa: normal adnexa and no mass, fullness,  tenderness  Endometrial biopsy recommended.  Discussed with patient.  Verbal and written consent obtained.   Procedure:  Speculum placed.  Cervix visualized and cleansed with betadine prep.  A single toothed  tenaculum was not applied to the anterior lip of the cervix.  Endometrial pipelle was advanced through the cervix into the endometrial cavity without difficulty.  Pipelle passed to 6.5cm.  Suction applied and pipelle removed with good tissue sample obtained.  Tenculum removed.  No bleeding noted.  Patient tolerated procedure well.  Chaperone, Ezekiel Ina, RN, was present for exam.  Assessment/Plan: 1. Postmenopausal bleeding - Surgical pathology( [place]/ [facility])  2. Cervical cancer screening - Cytology - PAP( [place]) - PR OBTAINING SCREEN PAP SMEAR

## 2021-09-24 LAB — SURGICAL PATHOLOGY

## 2021-09-24 LAB — CYTOLOGY - PAP: Diagnosis: NEGATIVE

## 2021-09-25 NOTE — Addendum Note (Signed)
Addended by: Megan Salon on: 09/25/2021 12:15 AM   Modules accepted: Orders

## 2021-09-26 ENCOUNTER — Ambulatory Visit (INDEPENDENT_AMBULATORY_CARE_PROVIDER_SITE_OTHER): Payer: Medicare PPO

## 2021-09-26 ENCOUNTER — Other Ambulatory Visit: Payer: Self-pay

## 2021-09-26 ENCOUNTER — Encounter (HOSPITAL_BASED_OUTPATIENT_CLINIC_OR_DEPARTMENT_OTHER): Payer: Self-pay | Admitting: Obstetrics & Gynecology

## 2021-09-26 ENCOUNTER — Ambulatory Visit (INDEPENDENT_AMBULATORY_CARE_PROVIDER_SITE_OTHER): Payer: Medicare PPO | Admitting: Obstetrics & Gynecology

## 2021-09-26 VITALS — BP 162/76 | HR 85 | Ht 62.0 in | Wt 203.0 lb

## 2021-09-26 DIAGNOSIS — C50411 Malignant neoplasm of upper-outer quadrant of right female breast: Secondary | ICD-10-CM | POA: Diagnosis not present

## 2021-09-26 DIAGNOSIS — N950 Postmenopausal bleeding: Secondary | ICD-10-CM

## 2021-09-26 DIAGNOSIS — I10 Essential (primary) hypertension: Secondary | ICD-10-CM | POA: Diagnosis not present

## 2021-09-26 DIAGNOSIS — D251 Intramural leiomyoma of uterus: Secondary | ICD-10-CM | POA: Diagnosis not present

## 2021-09-26 DIAGNOSIS — Z170 Estrogen receptor positive status [ER+]: Secondary | ICD-10-CM

## 2021-09-26 NOTE — Addendum Note (Signed)
Addended by: Maren Beach, Kortnie Stovall A on: 09/26/2021 01:27 PM   Modules accepted: Orders

## 2021-09-27 NOTE — Progress Notes (Signed)
GYNECOLOGY  VISIT  CC:   follow up after having episode of PMP bleeding  HPI: 71 y.o. G53P2002 Married White or Caucasian female here for recheck after biopsy results, pap results and ultrasound results finalized.  Pt reports bleeding has stopped.  Pap and endometrial biopsy results reviewed.  Ultrasound showed thin endometrium with small amount of fluid present.  This could be from recent bleeding and/or biopsy.  Ovaries not clearly seen but no masses or cysts noted.  No free fluid in cul de sac noted.  Nabothian cyst noted.  Images all reviewed with pt.  Questions answered.    Pt aware BP elevated.  She reports she is very nervous today and will check it when she gets home.  If still elevated, will let me or PCP know.  Patient Active Problem List   Diagnosis Date Noted   Hypercoagulable state due to atrial fibrillation ([place]) 09/20/2021   Pacemaker, Medtronic 2019 09/20/2021   Tachycardia-bradycardia syndrome ([place]) 05/04/2018   SVT (supraventricular tachycardia) ([place]) 05/02/2016   Morbid obesity ([place]) 07/24/2015   Genetic testing 05/12/2015   Family history of breast cancer    Malignant neoplasm of upper-outer quadrant of right breast in female, estrogen receptor positive ([place]) 04/24/2015   Persistent atrial fibrillation ([place]) 02/04/2014   Benign essential HTN 02/04/2014   OSA (obstructive sleep apnea) 02/04/2014   Dyslipidemia 08/16/2011    Past Medical History:  Diagnosis Date   Allergic dermatitis    Anxiety    Arthritis    back, knees & ankles    Atrial flutter ([place]) 05/25/2016   Benign essential HTN 02/04/2014   Breast cancer ([place])    Cancer ([place])    skin- lower abdomen   Depression    Dyslipidemia 08/16/2011   Family history of breast cancer    Genetic testing 05/12/2015   Negative genetic testing on the Breast/Ovarian cancer panel.  The Breast/Ovarian gene panel offered by [facility] includes sequencing and rearrangement analysis for the following 20 genes:  ATM, BARD1, BRCA1,  BRCA2, BRIP1, CDH1, CHEK2, EPCAM, FANCC, MLH1, MSH2, MSH6, NBN, PALB2, PMS2, PTEN, RAD51C, RAD51D, TP53, and XRCC2.   The report date is May 11, 2015.    GERD (gastroesophageal reflux disease)    Hiatal hernia    with GERD   Hypercholesteremia    Hyperthyroidism    pt. reports that she thinks she has hypothyroidism, states MD- [place] took her off med. in the past 1-2 yrs.    Malignant neoplasm of upper-outer quadrant of right breast in female, estrogen receptor positive ([place]) 04/24/2015   Mild cognitive impairment with memory loss    Morbid obesity ([place])    OSA (obstructive sleep apnea) 02/04/2014   Persistent atrial fibrillation ([facility])    Sleep apnea    severe with AHI 37/hr now on CPAP at 13cm H2O   SVT (supraventricular tachycardia) ([place])    likely due to atrial flutter.  successfullly ablated by Dr Lovena Le 10/17   Tachycardia-bradycardia syndrome ([place]) 05/04/2018   S/P MDT PPM    Past Surgical History:  Procedure Laterality Date   ABLATION  06/2016   Dr. Delilah Shan   APPENDECTOMY  28 years ago   [place]    Polyp resection    ELECTROPHYSIOLOGIC STUDY N/A 05/27/2016   Procedure: EPS/SVT Ablation;  Surgeon: Evans Lance, MD;  Location: [facility];  Service: Cardiovascular;  Laterality: N/A;   HYSTEROSCOPY  4/12   D&C   PACEMAKER IMPLANT N/A 04/16/2018  Medtronic Azure XT DR MRI SureScan PPM implanted by Dr Rayann Heman for afib with post termination pauses/ sick sinus syndrome   RADIOACTIVE SEED GUIDED PARTIAL MASTECTOMY WITH AXILLARY SENTINEL LYMPH NODE BIOPSY Right 05/18/2015   Procedure: RIGHT BREAST RADIOACTIVE SEED PARTIAL MASTECTOMY WITH RIGHT SENTINEL LYMPH NODE MAPPING;  Surgeon: Erroll Luna, MD;  Location: [facility];  Service: General;  Laterality: Right;   TONSILLECTOMY     TUBAL LIGATION      MEDS:   Current Outpatient Medications on File Prior to Visit  Medication Sig Dispense Refill   acetaminophen (TYLENOL) 500 MG tablet Take 500  mg by mouth every 6 (six) hours as needed for pain.     atorvastatin (LIPITOR) 20 MG tablet Take 20 mg by mouth daily after breakfast.      cholecalciferol (VITAMIN D) 1000 units tablet Take 1,000 Units by mouth daily.     diclofenac Sodium (VOLTAREN) 1 % GEL diclofenac 1 % topical gel  APPLY AA QID     diltiazem (CARDIZEM CD) 120 MG 24 hr capsule TAKE 1 CAPSULE(120 MG) BY MOUTH DAILY 90 capsule 2   flecainide (TAMBOCOR) 100 MG tablet Take 1 tablet (100 mg total) by mouth 2 (two) times daily. Please make yearly appt with Dr. Radford Pax for February 2023 for future refills. Thank you 1st attempt 180 tablet 0   hydrochlorothiazide (HYDRODIURIL) 25 MG tablet Take 1 tablet (25 mg total) by mouth daily. Please call and schedule an appointment for further refills 2nd attempt 90 tablet 1   Omega-3 Fatty Acids (FISH OIL) 1000 MG CAPS Take 1,000 mg by mouth daily.     vitamin B-12 (CYANOCOBALAMIN) 1000 MCG tablet Take 1,000 mcg by mouth daily.     warfarin (COUMADIN) 5 MG tablet Take 1.5 tablets (7.5 mg total) by mouth daily. Resume on 05/30/2016     No current facility-administered medications on file prior to visit.    ALLERGIES: Codeine, Adhesive [tape], and Latex  Family History  Problem Relation Age of Onset   Hypertension Mother    Brain cancer Mother    Hypertension Sister    Thyroid disease Sister    Hypertension Sister    Hypertension Brother    Thyroid disease Brother    Diabetes Maternal Grandmother    Heart attack Maternal Grandmother    Breast cancer Paternal Aunt        dx in her late 37s- early 2s   Melanoma Paternal 48    Breast cancer Paternal Aunt    Multiple myeloma Paternal Aunt    Other Daughter 13       ITP    SH:  married, non smoker  Review of Systems  All other systems reviewed and are negative.  PHYSICAL EXAMINATION:    BP (!) 162/76 (BP Location: Right Arm, Patient Position: Sitting, Cuff Size: Large)    Pulse 85    Ht '5\' 2"'  (1.575 m) Comment: reported    Wt 203 lb (92.1 kg) Comment: reported   LMP 11/21/2010 Comment: spotting-had hysteroscopy/d&c   BMI 37.13 kg/m     Physical Exam Constitutional:      Appearance: Normal appearance.  Neurological:     General: No focal deficit present.     Mental Status: She is alert.  Psychiatric:        Mood and Affect: Mood normal.    Assessment/Plan: 1. Postmenopausal bleeding - pap and endometrial biopsy normal.  Pt reassured with ultrasound findings.  Pt advised to call back with any additional  bleeding.  2. Benign essential HTN  3. Malignant neoplasm of upper-outer quadrant of right breast in female, estrogen receptor positive ([place])   Total time with pt and with documentation:  22 minutes

## 2021-10-02 ENCOUNTER — Other Ambulatory Visit (HOSPITAL_COMMUNITY): Payer: Self-pay | Admitting: Adult Health

## 2021-10-02 ENCOUNTER — Ambulatory Visit (HOSPITAL_COMMUNITY)
Admission: RE | Admit: 2021-10-02 | Discharge: 2021-10-02 | Disposition: A | Discharge status: Home / Self Care | Payer: Medicare PPO | Source: Ambulatory Visit | Attending: Adult Health | Admitting: Adult Health

## 2021-10-02 ENCOUNTER — Ambulatory Visit (HOSPITAL_COMMUNITY): Admission: RE | Admit: 2021-10-02 | Payer: Medicare PPO | Source: Ambulatory Visit

## 2021-10-02 ENCOUNTER — Other Ambulatory Visit: Payer: Self-pay

## 2021-10-02 DIAGNOSIS — R238 Other skin changes: Secondary | ICD-10-CM | POA: Diagnosis not present

## 2021-10-02 DIAGNOSIS — M79669 Pain in unspecified lower leg: Secondary | ICD-10-CM | POA: Insufficient documentation

## 2021-10-02 DIAGNOSIS — R791 Abnormal coagulation profile: Secondary | ICD-10-CM | POA: Diagnosis not present

## 2021-10-02 DIAGNOSIS — R2243 Localized swelling, mass and lump, lower limb, bilateral: Secondary | ICD-10-CM | POA: Insufficient documentation

## 2021-10-02 DIAGNOSIS — R209 Unspecified disturbances of skin sensation: Secondary | ICD-10-CM

## 2021-10-02 DIAGNOSIS — M7989 Other specified soft tissue disorders: Secondary | ICD-10-CM | POA: Insufficient documentation

## 2021-10-02 NOTE — Progress Notes (Signed)
Bilateral lower extremity venous duplex completed. Refer to "CV Proc" under chart review to view preliminary results.  10/02/2021 4:09 PM Kelby Aline., MHA, RVT, RDCS, RDMS

## 2021-10-03 ENCOUNTER — Ambulatory Visit (HOSPITAL_BASED_OUTPATIENT_CLINIC_OR_DEPARTMENT_OTHER): Payer: Medicare PPO | Admitting: Obstetrics & Gynecology

## 2021-10-09 NOTE — Progress Notes (Signed)
Electrophysiology Office Note Date: 10/10/2021  ID:  Courtney [facility], DOB [birth year], MRN [id number]  PCP: Reynold Bowen, MD Primary Cardiologist: Candee Furbish, MD Electrophysiologist: Thompson Grayer, MD   CC: Pacemaker follow-up  Courtney Dunlap is a 71 y.o. female seen today for Thompson Grayer, MD for routine electrophysiology followup.  Since last being seen in our clinic the patient reports doing very well.  she denies chest pain, palpitations, dyspnea, PND, orthopnea, nausea, vomiting, dizziness, syncope, edema, weight gain, or early satiety.  Device History: Medtronic Dual Chamber PPM implanted 04/2018 for symptomatic sinus bradycardia  Past Medical History:  Diagnosis Date   Allergic dermatitis    Anxiety    Arthritis    back, knees & ankles    Atrial flutter ([facility]) 05/25/2016   Benign essential HTN 02/04/2014   Breast cancer ([place])    Cancer ([place])    skin- lower abdomen   Depression    Dyslipidemia 08/16/2011   Family history of breast cancer    Genetic testing 05/12/2015   Negative genetic testing on the Breast/Ovarian cancer panel.  The Breast/Ovarian gene panel offered by [facility] includes sequencing and rearrangement analysis for the following 20 genes:  ATM, BARD1, BRCA1, BRCA2, BRIP1, CDH1, CHEK2, EPCAM, FANCC, MLH1, MSH2, MSH6, NBN, PALB2, PMS2, PTEN, RAD51C, RAD51D, TP53, and XRCC2.   The report date is May 11, 2015.    GERD (gastroesophageal reflux disease)    Hiatal hernia    with GERD   Hypercholesteremia    Hyperthyroidism    pt. reports that she thinks she has hypothyroidism, states MD- [place] took her off med. in the past 1-2 yrs.    Malignant neoplasm of upper-outer quadrant of right breast in female, estrogen receptor positive ([facility]) 04/24/2015   Mild cognitive impairment with memory loss    Morbid obesity ([facility])    OSA (obstructive sleep apnea) 02/04/2014   Persistent atrial fibrillation ([facility])    Sleep apnea    severe with AHI 37/hr now on CPAP at 13cm H2O    SVT (supraventricular tachycardia) ([facility])    likely due to atrial flutter.  successfullly ablated by Dr Lovena Le 10/17   Tachycardia-bradycardia syndrome ([facility]) 05/04/2018   S/P MDT PPM   Past Surgical History:  Procedure Laterality Date   ABLATION  06/2016   Dr. Delilah Shan   APPENDECTOMY  28 years ago   [facility]    Polyp resection    ELECTROPHYSIOLOGIC STUDY N/A 05/27/2016   Procedure: EPS/SVT Ablation;  Surgeon: Evans Lance, MD;  Location: [facility];  Service: Cardiovascular;  Laterality: N/A;   HYSTEROSCOPY  4/12   D&C   PACEMAKER IMPLANT N/A 04/16/2018   Medtronic Azure XT DR MRI SureScan PPM implanted by Dr Rayann Heman for afib with post termination pauses/ sick sinus syndrome   RADIOACTIVE SEED GUIDED PARTIAL MASTECTOMY WITH AXILLARY SENTINEL LYMPH NODE BIOPSY Right 05/18/2015   Procedure: RIGHT BREAST RADIOACTIVE SEED PARTIAL MASTECTOMY WITH RIGHT SENTINEL LYMPH NODE MAPPING;  Surgeon: Erroll Luna, MD;  Location: [facility];  Service: General;  Laterality: Right;   TONSILLECTOMY     TUBAL LIGATION      Current Outpatient Medications  Medication Sig Dispense Refill   acetaminophen (TYLENOL) 500 MG tablet Take 500 mg by mouth every 6 (six) hours as needed for pain.     atorvastatin (LIPITOR) 20 MG tablet Take 20 mg by mouth daily after breakfast.      cholecalciferol (VITAMIN D) 1000 units  tablet Take 1,000 Units by mouth daily.     diclofenac Sodium (VOLTAREN) 1 % GEL diclofenac 1 % topical gel  APPLY AA QID     diltiazem (CARDIZEM CD) 120 MG 24 hr capsule TAKE 1 CAPSULE(120 MG) BY MOUTH DAILY 90 capsule 2   flecainide (TAMBOCOR) 100 MG tablet Take 1 tablet (100 mg total) by mouth 2 (two) times daily. Please make yearly appt with Dr. Radford Pax for February 2023 for future refills. Thank you 1st attempt 180 tablet 0   hydrochlorothiazide (HYDRODIURIL) 25 MG tablet Take 1 tablet (25 mg total) by mouth daily. Please call and schedule an appointment for  further refills 2nd attempt 90 tablet 1   Omega-3 Fatty Acids (FISH OIL) 1000 MG CAPS Take 1,000 mg by mouth daily.     vitamin B-12 (CYANOCOBALAMIN) 1000 MCG tablet Take 1,000 mcg by mouth daily.     warfarin (COUMADIN) 5 MG tablet Take 1.5 tablets (7.5 mg total) by mouth daily. Resume on 05/30/2016 (Patient taking differently: Take 7.5 mg by mouth daily. Take 1.5 tab m, w, f, sat, sun; other days, 1 tab 24m)     No current facility-administered medications for this visit.    Allergies:   Codeine, Adhesive [tape], and Latex   Social History: Social History   Socioeconomic History   Marital status: Married    Spouse name: Not on file   Number of children: 2   Years of education: Not on file   Highest education level: Not on file  Occupational History    Employer: [employer] Tobacco Use   Smoking status: Former    Years: 4.00    Types: Cigarettes    Quit date: 05/15/1974    Years since quitting: 47.4   Smokeless tobacco: Never   Tobacco comments:    quit 35-40 years ago  Vaping Use   Vaping Use: Never used  Substance and Sexual Activity   Alcohol use: Yes    Alcohol/week: 3.0 standard drinks    Types: 3 Standard drinks or equivalent per week   Drug use: No   Sexual activity: Not Currently    Birth control/protection: Post-menopausal  Other Topics Concern   Not on file  Social History Narrative   Not on file   Social Determinants of Health   Financial Resource Strain: Not on file  Food Insecurity: Not on file  Transportation Needs: Not on file  Physical Activity: Not on file  Stress: Not on file  Social Connections: Not on file  Intimate Partner Violence: Not on file    Family History: Family History  Problem Relation Age of Onset   Hypertension Mother    Brain cancer Mother    Hypertension Sister    Thyroid disease Sister    Hypertension Sister    Hypertension Brother    Thyroid disease Brother    Diabetes Maternal Grandmother    Heart  attack Maternal Grandmother    Breast cancer Paternal Aunt        dx in her late 36s early 434s  Melanoma Paternal Aunt    Breast cancer Paternal Aunt    Multiple myeloma Paternal Aunt    Other Daughter 437      ITP     Review of Systems: All other systems reviewed and are otherwise negative except as noted above.  Physical Exam: Vitals:   10/10/21 0842  BP: 133/70  Pulse: 76  SpO2: 99%  Weight: 201 lb (91.2 kg)  Height:  5' 2" (1.575 m)     GEN- The patient is well appearing, alert and oriented x 3 today.   HEENT: normocephalic, atraumatic; sclera clear, conjunctiva pink; hearing intact; oropharynx clear; neck supple  Lungs- Clear to ausculation bilaterally, normal work of breathing.  No wheezes, rales, rhonchi Heart- Regular rate and rhythm, no murmurs, rubs or gallops  GI- soft, non-tender, non-distended, bowel sounds present  Extremities- no clubbing or cyanosis. No edema MS- no significant deformity or atrophy Skin- warm and dry, no rash or lesion; PPM pocket well healed Psych- euthymic mood, full affect Neuro- strength and sensation are intact  PPM Interrogation- reviewed in detail today,  See PACEART report  EKG:  EKG is not ordered today. Personal review of ekg ordered  09/20/2021  shows V pacing at 85 bpm   Recent Labs: No results found for requested labs within last 8760 hours.   Wt Readings from Last 3 Encounters:  10/10/21 201 lb (91.2 kg)  09/26/21 203 lb (92.1 kg)  09/21/21 202 lb 9.6 oz (91.9 kg)     Other studies Reviewed: Additional studies/ records that were reviewed today include: Previous EP office notes, Previous remote checks, Most recent labwork.   Assessment and Plan:  1. Symptomatic bradycardia s/p Medtronic PPM  Normal PPM function See Pace Art report No changes today  2. Paroxysmal atrial fibrillation Burden is <1%. Continue coumadin Continue flecainide 100 mg BID (previously switched from sotalol due to increased AF) Continue  diltiazem 120 mg daily  3. OSA  Encouraged nightly CPAP  4. Obesity Body mass index is 36.76 kg/m.   5. HTN Stable on current regimen   Current medicines are reviewed at length with the patient today.    Disposition:   Follow up with  EPMD  in 12 months   Signed, Shirley Friar, PA-C  10/10/2021 11:11 AM  [facility] [address] [place] [address] [phone] (office) [phone] (fax)

## 2021-10-10 ENCOUNTER — Other Ambulatory Visit: Payer: Self-pay

## 2021-10-10 ENCOUNTER — Ambulatory Visit: Payer: Medicare PPO | Admitting: Student

## 2021-10-10 ENCOUNTER — Encounter: Payer: Self-pay | Admitting: Student

## 2021-10-10 VITALS — BP 133/70 | HR 76 | Ht 62.0 in | Wt 201.0 lb

## 2021-10-10 DIAGNOSIS — I4819 Other persistent atrial fibrillation: Secondary | ICD-10-CM

## 2021-10-10 DIAGNOSIS — Z950 Presence of cardiac pacemaker: Secondary | ICD-10-CM

## 2021-10-10 DIAGNOSIS — I495 Sick sinus syndrome: Secondary | ICD-10-CM

## 2021-10-10 DIAGNOSIS — G4733 Obstructive sleep apnea (adult) (pediatric): Secondary | ICD-10-CM

## 2021-10-10 LAB — CUP PACEART INCLINIC DEVICE CHECK
Battery Remaining Longevity: 135 mo
Battery Voltage: 3.02 V
Brady Statistic AP VP Percent: 0.01 %
Brady Statistic AP VS Percent: 55.3 %
Brady Statistic AS VP Percent: 0 %
Brady Statistic AS VS Percent: 44.7 %
Brady Statistic RA Percent Paced: 55.23 %
Brady Statistic RV Percent Paced: 0.01 %
Date Time Interrogation Session: 20230301111046
Implantable Lead Implant Date: 20190905
Implantable Lead Implant Date: 20190905
Implantable Lead Location: 753859
Implantable Lead Location: 753860
Implantable Lead Model: 3830
Implantable Lead Model: 5076
Implantable Pulse Generator Implant Date: 20190905
Lead Channel Impedance Value: 323 Ohm
Lead Channel Impedance Value: 418 Ohm
Lead Channel Impedance Value: 437 Ohm
Lead Channel Impedance Value: 494 Ohm
Lead Channel Pacing Threshold Amplitude: 0.75 V
Lead Channel Pacing Threshold Pulse Width: 0.4 ms
Lead Channel Sensing Intrinsic Amplitude: 0.875 mV
Lead Channel Sensing Intrinsic Amplitude: 5 mV
Lead Channel Sensing Intrinsic Amplitude: 6.5 mV
Lead Channel Sensing Intrinsic Amplitude: 8.375 mV
Lead Channel Setting Pacing Amplitude: 1.75 V
Lead Channel Setting Pacing Amplitude: 2.5 V
Lead Channel Setting Pacing Pulse Width: 0.4 ms
Lead Channel Setting Sensing Sensitivity: 0.9 mV

## 2021-10-10 NOTE — Patient Instructions (Signed)
Medication Instructions:  Your physician recommends that you continue on your current medications as directed. Please refer to the Current Medication list given to you today.  *If you need a refill on your cardiac medications before your next appointment, please call your pharmacy*   Lab Work: None  If you have labs (blood work) drawn today and your tests are completely normal, you will receive your results only by: MyChart Message (if you have MyChart) OR A paper copy in the mail If you have any lab test that is abnormal or we need to change your treatment, we will call you to review the results.   Follow-Up: At [facility], you and your health needs are our priority.  As part of our continuing mission to provide you with exceptional heart care, we have created designated Provider Care Teams.  These Care Teams include your primary Cardiologist (physician) and Advanced Practice Providers (APPs -  Physician Assistants and Nurse Practitioners) who all work together to provide you with the care you need, when you need it.  We recommend signing up for the patient portal called "MyChart".  Sign up information is provided on this After Visit Summary.  MyChart is used to connect with patients for Virtual Visits (Telemedicine).  Patients are able to view lab/test results, encounter notes, upcoming appointments, etc.  Non-urgent messages can be sent to your provider as well.   To learn more about what you can do with MyChart, go to [email or web address].    Your next appointment:   6 month(s)  The format for your next appointment:   In Person  Provider:   Michael "Andy" Tillery, PA-C{    

## 2021-10-11 DIAGNOSIS — I480 Paroxysmal atrial fibrillation: Secondary | ICD-10-CM | POA: Diagnosis not present

## 2021-10-11 DIAGNOSIS — Z7901 Long term (current) use of anticoagulants: Secondary | ICD-10-CM | POA: Diagnosis not present

## 2021-10-12 DIAGNOSIS — M7551 Bursitis of right shoulder: Secondary | ICD-10-CM | POA: Diagnosis not present

## 2021-10-12 DIAGNOSIS — M7541 Impingement syndrome of right shoulder: Secondary | ICD-10-CM | POA: Diagnosis not present

## 2021-10-12 DIAGNOSIS — M170 Bilateral primary osteoarthritis of knee: Secondary | ICD-10-CM | POA: Diagnosis not present

## 2021-10-24 ENCOUNTER — Ambulatory Visit (HOSPITAL_BASED_OUTPATIENT_CLINIC_OR_DEPARTMENT_OTHER): Payer: Medicare PPO | Admitting: Obstetrics & Gynecology

## 2021-10-25 DIAGNOSIS — M7541 Impingement syndrome of right shoulder: Secondary | ICD-10-CM | POA: Diagnosis not present

## 2021-10-25 DIAGNOSIS — M25511 Pain in right shoulder: Secondary | ICD-10-CM | POA: Diagnosis not present

## 2021-10-30 ENCOUNTER — Ambulatory Visit (INDEPENDENT_AMBULATORY_CARE_PROVIDER_SITE_OTHER): Payer: Medicare PPO

## 2021-10-30 DIAGNOSIS — I495 Sick sinus syndrome: Secondary | ICD-10-CM

## 2021-10-31 DIAGNOSIS — M7541 Impingement syndrome of right shoulder: Secondary | ICD-10-CM | POA: Diagnosis not present

## 2021-10-31 DIAGNOSIS — M25511 Pain in right shoulder: Secondary | ICD-10-CM | POA: Diagnosis not present

## 2021-10-31 LAB — CUP PACEART REMOTE DEVICE CHECK
Battery Remaining Longevity: 132 mo
Battery Voltage: 3.02 V
Brady Statistic AP VP Percent: 0.01 %
Brady Statistic AP VS Percent: 46.17 %
Brady Statistic AS VP Percent: 0 %
Brady Statistic AS VS Percent: 53.82 %
Brady Statistic RA Percent Paced: 46.13 %
Brady Statistic RV Percent Paced: 0.01 %
Date Time Interrogation Session: 20230320221208
Implantable Lead Implant Date: 20190905
Implantable Lead Implant Date: 20190905
Implantable Lead Location: 753859
Implantable Lead Location: 753860
Implantable Lead Model: 3830
Implantable Lead Model: 5076
Implantable Pulse Generator Implant Date: 20190905
Lead Channel Impedance Value: 304 Ohm
Lead Channel Impedance Value: 342 Ohm
Lead Channel Impedance Value: 399 Ohm
Lead Channel Impedance Value: 399 Ohm
Lead Channel Pacing Threshold Amplitude: 0.875 V
Lead Channel Pacing Threshold Pulse Width: 0.4 ms
Lead Channel Sensing Intrinsic Amplitude: 1 mV
Lead Channel Sensing Intrinsic Amplitude: 1 mV
Lead Channel Sensing Intrinsic Amplitude: 5.875 mV
Lead Channel Sensing Intrinsic Amplitude: 5.875 mV
Lead Channel Setting Pacing Amplitude: 1.75 V
Lead Channel Setting Pacing Amplitude: 2.5 V
Lead Channel Setting Pacing Pulse Width: 0.4 ms
Lead Channel Setting Sensing Sensitivity: 0.9 mV

## 2021-11-05 ENCOUNTER — Other Ambulatory Visit: Payer: Self-pay | Admitting: Cardiology

## 2021-11-05 DIAGNOSIS — I480 Paroxysmal atrial fibrillation: Secondary | ICD-10-CM

## 2021-11-05 DIAGNOSIS — M25511 Pain in right shoulder: Secondary | ICD-10-CM | POA: Diagnosis not present

## 2021-11-05 DIAGNOSIS — M7541 Impingement syndrome of right shoulder: Secondary | ICD-10-CM | POA: Diagnosis not present

## 2021-11-13 DIAGNOSIS — Z7901 Long term (current) use of anticoagulants: Secondary | ICD-10-CM | POA: Diagnosis not present

## 2021-11-13 DIAGNOSIS — I480 Paroxysmal atrial fibrillation: Secondary | ICD-10-CM | POA: Diagnosis not present

## 2021-11-13 DIAGNOSIS — I10 Essential (primary) hypertension: Secondary | ICD-10-CM | POA: Diagnosis not present

## 2021-11-13 NOTE — Progress Notes (Signed)
Remote pacemaker transmission.   

## 2021-12-18 DIAGNOSIS — Z7901 Long term (current) use of anticoagulants: Secondary | ICD-10-CM | POA: Diagnosis not present

## 2021-12-18 DIAGNOSIS — I480 Paroxysmal atrial fibrillation: Secondary | ICD-10-CM | POA: Diagnosis not present

## 2021-12-24 ENCOUNTER — Other Ambulatory Visit: Payer: Self-pay

## 2021-12-24 ENCOUNTER — Emergency Department (HOSPITAL_BASED_OUTPATIENT_CLINIC_OR_DEPARTMENT_OTHER)
Admission: EM | Admit: 2021-12-24 | Discharge: 2021-12-24 | Disposition: A | Discharge status: Home / Self Care | Payer: Medicare PPO | Attending: Emergency Medicine | Admitting: Emergency Medicine

## 2021-12-24 ENCOUNTER — Emergency Department (HOSPITAL_BASED_OUTPATIENT_CLINIC_OR_DEPARTMENT_OTHER): Payer: Medicare PPO | Admitting: Radiology

## 2021-12-24 ENCOUNTER — Emergency Department (HOSPITAL_BASED_OUTPATIENT_CLINIC_OR_DEPARTMENT_OTHER): Payer: Medicare PPO

## 2021-12-24 ENCOUNTER — Encounter (HOSPITAL_BASED_OUTPATIENT_CLINIC_OR_DEPARTMENT_OTHER): Payer: Self-pay

## 2021-12-24 DIAGNOSIS — M25562 Pain in left knee: Secondary | ICD-10-CM | POA: Insufficient documentation

## 2021-12-24 DIAGNOSIS — S99922A Unspecified injury of left foot, initial encounter: Secondary | ICD-10-CM | POA: Diagnosis present

## 2021-12-24 DIAGNOSIS — R519 Headache, unspecified: Secondary | ICD-10-CM | POA: Insufficient documentation

## 2021-12-24 DIAGNOSIS — S9030XA Contusion of unspecified foot, initial encounter: Secondary | ICD-10-CM

## 2021-12-24 DIAGNOSIS — M79671 Pain in right foot: Secondary | ICD-10-CM | POA: Diagnosis not present

## 2021-12-24 DIAGNOSIS — Z7901 Long term (current) use of anticoagulants: Secondary | ICD-10-CM | POA: Insufficient documentation

## 2021-12-24 DIAGNOSIS — S9032XA Contusion of left foot, initial encounter: Secondary | ICD-10-CM | POA: Diagnosis not present

## 2021-12-24 DIAGNOSIS — M25512 Pain in left shoulder: Secondary | ICD-10-CM | POA: Diagnosis not present

## 2021-12-24 DIAGNOSIS — Z91040 Latex allergy status: Secondary | ICD-10-CM | POA: Diagnosis not present

## 2021-12-24 DIAGNOSIS — W19XXXS Unspecified fall, sequela: Secondary | ICD-10-CM

## 2021-12-24 DIAGNOSIS — W01198A Fall on same level from slipping, tripping and stumbling with subsequent striking against other object, initial encounter: Secondary | ICD-10-CM | POA: Insufficient documentation

## 2021-12-24 DIAGNOSIS — M19072 Primary osteoarthritis, left ankle and foot: Secondary | ICD-10-CM | POA: Diagnosis not present

## 2021-12-24 DIAGNOSIS — Z043 Encounter for examination and observation following other accident: Secondary | ICD-10-CM | POA: Diagnosis not present

## 2021-12-24 NOTE — ED Notes (Signed)
Discharge paperwork given and understood. 

## 2021-12-24 NOTE — ED Triage Notes (Signed)
Patient here POV from Home. ? ?Endorses falling on Friday after tripping on Extension Cord. Endorses hitting Left Forehead, Left Shoulder, Left Knee, and Left Toes.  ? ?Patient does take Warfarin. Sent by PCP for Imaging due to Head Injury. ? ?NAD Noted during Triage. A&Ox4. GCS  15. Ambulatory.  ?

## 2021-12-24 NOTE — ED Provider Notes (Signed)
?[facility] ?Provider Note ? ? ?CSN: [id number] ?Arrival date & time: 12/24/21  1223 ? ?  ? ?History ? ?Chief Complaint  ?Patient presents with  ? Fall  ? ? ?Courtney Dunlap is a 71 y.o. female. ? ?Patient with fall 3 days ago.  Tripped over an extension cord.  Hit her forehead, left shoulder, both of her toes.  Mostly having pain in the left foot.  She is on Coumadin.  History of high cholesterol, anxiety, atrial flutter.  Nothing has made it worse or better.  She has extensive bruising to the left toe.  She has been able to ambulate.  Rest has been better. ? ?The history is provided by the patient.  ? ?  ? ?Home Medications ?Prior to Admission medications   ?Medication Sig Start Date End Date Taking? Authorizing Provider  ?acetaminophen (TYLENOL) 500 MG tablet Take 500 mg by mouth every 6 (six) hours as needed for pain.    [provider]  ?atorvastatin (LIPITOR) 20 MG tablet Take 20 mg by mouth daily after breakfast.  07/27/13   [provider]  ?cholecalciferol (VITAMIN D) 1000 units tablet Take 1,000 Units by mouth daily.    [provider]  ?diclofenac Sodium (VOLTAREN) 1 % GEL diclofenac 1 % topical gel ? APPLY AA QID    [provider]  ?diltiazem (CARDIZEM CD) 120 MG 24 hr capsule TAKE 1 CAPSULE(120 MG) BY MOUTH DAILY 04/19/21   Allred, Jeneen Rinks, MD  ?flecainide (TAMBOCOR) 100 MG tablet TAKE 1 TABLET('100MG'$ ) BY MOUTH TWICE DAILY. MUST MAKE YEARLY APPOINTMENT WITH DOCTOR TURNER FOR REFILLS 11/05/21   Sueanne Margarita, MD  ?hydrochlorothiazide (HYDRODIURIL) 25 MG tablet Take 1 tablet (25 mg total) by mouth daily. Please call and schedule an appointment for further refills 2nd attempt 06/01/19   Megan Salon, MD  ?Omega-3 Fatty Acids (FISH OIL) 1000 MG CAPS Take 1,000 mg by mouth daily.    [provider]  ?vitamin B-12 (CYANOCOBALAMIN) 1000 MCG tablet Take 1,000 mcg by mouth daily.    [provider]  ?warfarin (COUMADIN) 5 MG tablet  Take 1.5 tablets (7.5 mg total) by mouth daily. Resume on 05/30/2016 ?Patient taking differently: Take 7.5 mg by mouth daily. Take 1.5 tab m, w, f, sat, sun; other days, 1 tab '5mg'$  05/28/16   [name], NP  ?   ? ?Allergies    ?Codeine, Adhesive [tape], and Latex   ? ?Review of Systems   ?Review of Systems ? ?Physical Exam ?Updated Vital Signs ?BP (!) 163/69 (BP Location: Right Arm)   Pulse 62   Temp 98.5 ?F (36.9 ?C) (Temporal)   Resp 16   Ht '5\' 2"'$  (1.575 m)   Wt 91.2 kg   LMP 11/21/2010 Comment: spotting-had hysteroscopy/d&c  SpO2 97%   BMI 36.77 kg/m?  ?Physical Exam ?Vitals and nursing note reviewed.  ?Constitutional:   ?   General: She is not in acute distress. ?   Appearance: She is well-developed. She is not ill-appearing.  ?HENT:  ?   Head: Normocephalic and atraumatic.  ?   Nose: Nose normal.  ?   Mouth/Throat:  ?   Mouth: Mucous membranes are moist.  ?Eyes:  ?   Extraocular Movements: Extraocular movements intact.  ?   Conjunctiva/sclera: Conjunctivae normal.  ?   Pupils: Pupils are equal, round, and reactive to light.  ?Cardiovascular:  ?   Rate and Rhythm: Normal rate and regular rhythm.  ?   Heart  sounds: No murmur heard. ?Pulmonary:  ?   Effort: Pulmonary effort is normal. No respiratory distress.  ?   Breath sounds: Normal breath sounds.  ?Abdominal:  ?   Palpations: Abdomen is soft.  ?   Tenderness: There is no abdominal tenderness.  ?Musculoskeletal:     ?   General: Tenderness present. No swelling.  ?   Cervical back: Normal range of motion and neck supple.  ?   Comments: Tenderness to her left and right foot, tenderness to the left knee, left shoulder  ?Skin: ?   General: Skin is warm and dry.  ?   Capillary Refill: Capillary refill takes less than 2 seconds.  ?   Comments: Bruising to her left big toe  ?Neurological:  ?   General: No focal deficit present.  ?   Mental Status: She is alert and oriented to person, place, and time.  ?   Cranial Nerves: No cranial nerve deficit.  ?    Sensory: No sensory deficit.  ?   Motor: No weakness.  ?   Coordination: Coordination normal.  ?   Comments: 5+ out of 5 strength throughout, normal sensation, no drift, normal finger-nose-finger, normal speech  ?Psychiatric:     ?   Mood and Affect: Mood normal.  ? ? ?ED Results / Procedures / Treatments   ?Labs ?(all labs ordered are listed, but only abnormal results are displayed) ?Labs Reviewed - No data to display ? ?EKG ?None ? ?Radiology ?CT Head Wo Contrast ? ?Result Date: 12/24/2021 ?CLINICAL DATA:  Head trauma fall from standing position. Headache and neck stiffness. EXAM: CT HEAD WITHOUT CONTRAST TECHNIQUE: Contiguous axial images were obtained from the base of the skull through the vertex without intravenous contrast. RADIATION DOSE REDUCTION: This exam was performed according to the departmental dose-optimization program which includes automated exposure control, adjustment of the mA and/or kV according to patient size and/or use of iterative reconstruction technique. COMPARISON:  None Available. FINDINGS: Brain: No acute infarct, hemorrhage, or mass lesion is present. No significant white matter lesions are present. The ventricles are of normal size. No significant extraaxial fluid collection is present. The brainstem and cerebellum are within normal limits. Vascular: Atherosclerotic calcifications are present within the cavernous internal carotid arteries bilaterally. No hyperdense vessel is present. Skull: Calvarium is intact. No focal lytic or blastic lesions are present. No significant extracranial soft tissue lesion is present. Sinuses/Orbits: The paranasal sinuses and mastoid air cells are clear. The globes and orbits are within normal limits. IMPRESSION: Negative CT of the head. Electronically Signed   By: San Morelle M.D.   On: 12/24/2021 14:54  ? ?CT Cervical Spine Wo Contrast ? ?Result Date: 12/24/2021 ?CLINICAL DATA:  Fall from a standing position. Tripped over a cord. Neck stiffness.  EXAM: CT CERVICAL SPINE WITHOUT CONTRAST TECHNIQUE: Multidetector CT imaging of the cervical spine was performed without intravenous contrast. Multiplanar CT image reconstructions were also generated. RADIATION DOSE REDUCTION: This exam was performed according to the departmental dose-optimization program which includes automated exposure control, adjustment of the mA and/or kV according to patient size and/or use of iterative reconstruction technique. COMPARISON:  CT of the cervical spine 04/25/2014 FINDINGS: Alignment: Slight degenerative anterolisthesis at C4-5 is stable. No other significant listhesis is present. Straightening of the normal cervical lordosis is similar the prior exam. Skull base and vertebrae: Craniocervical junction is normal. Vertebral body heights are maintained. No acute fractures are present. Soft tissues and spinal canal: No prevertebral fluid or swelling. No  visible canal hematoma. Disc levels: Multilevel degenerative changes are present. Foraminal disease is greatest at C4-5 and C6-7. Upper chest: Lung apices are clear. The thoracic inlet is within normal limits. IMPRESSION: 1. No acute fracture or traumatic subluxation. 2. Multilevel degenerative changes as described. Electronically Signed   By: San Morelle M.D.   On: 12/24/2021 14:59  ? ?DG Shoulder Left ? ?Result Date: 12/24/2021 ?CLINICAL DATA:  Fall, initial encounter. EXAM: LEFT SHOULDER - 2+ VIEW COMPARISON:  None Available. FINDINGS: No acute osseous or joint abnormality. Mild to moderate degenerative changes in the left acromioclavicular joint. Visualized left chest is grossly unremarkable. IMPRESSION: 1. No acute findings. 2. Left acromioclavicular joint osteoarthritis. Electronically Signed   By: Lorin Picket M.D.   On: 12/24/2021 13:16  ? ?DG Knee Complete 4 Views Left ? ?Result Date: 12/24/2021 ?CLINICAL DATA:  Fall.  Left knee pain. EXAM: LEFT KNEE - COMPLETE 4+ VIEW COMPARISON:  None Available. FINDINGS: No  evidence of fracture or dislocation. A moderate knee joint effusion is seen. Moderate tricompartmental osteoarthritis is seen. No focal lytic or sclerotic bone lesions identified. IMPRESSION: No evidence of fracture

## 2022-01-15 DIAGNOSIS — H2513 Age-related nuclear cataract, bilateral: Secondary | ICD-10-CM | POA: Diagnosis not present

## 2022-01-15 DIAGNOSIS — I10 Essential (primary) hypertension: Secondary | ICD-10-CM | POA: Diagnosis not present

## 2022-01-15 DIAGNOSIS — H524 Presbyopia: Secondary | ICD-10-CM | POA: Diagnosis not present

## 2022-01-15 DIAGNOSIS — H5212 Myopia, left eye: Secondary | ICD-10-CM | POA: Diagnosis not present

## 2022-01-15 DIAGNOSIS — Z0000 Encounter for general adult medical examination without abnormal findings: Secondary | ICD-10-CM | POA: Diagnosis not present

## 2022-01-15 DIAGNOSIS — H52223 Regular astigmatism, bilateral: Secondary | ICD-10-CM | POA: Diagnosis not present

## 2022-01-15 DIAGNOSIS — H35033 Hypertensive retinopathy, bilateral: Secondary | ICD-10-CM | POA: Diagnosis not present

## 2022-01-15 DIAGNOSIS — H5201 Hypermetropia, right eye: Secondary | ICD-10-CM | POA: Diagnosis not present

## 2022-01-16 DIAGNOSIS — E0500 Thyrotoxicosis with diffuse goiter without thyrotoxic crisis or storm: Secondary | ICD-10-CM | POA: Diagnosis not present

## 2022-01-16 DIAGNOSIS — F419 Anxiety disorder, unspecified: Secondary | ICD-10-CM | POA: Diagnosis not present

## 2022-01-16 DIAGNOSIS — E559 Vitamin D deficiency, unspecified: Secondary | ICD-10-CM | POA: Diagnosis not present

## 2022-01-16 DIAGNOSIS — R7302 Impaired glucose tolerance (oral): Secondary | ICD-10-CM | POA: Diagnosis not present

## 2022-01-16 DIAGNOSIS — E785 Hyperlipidemia, unspecified: Secondary | ICD-10-CM | POA: Diagnosis not present

## 2022-01-25 DIAGNOSIS — R82998 Other abnormal findings in urine: Secondary | ICD-10-CM | POA: Diagnosis not present

## 2022-01-29 ENCOUNTER — Ambulatory Visit (INDEPENDENT_AMBULATORY_CARE_PROVIDER_SITE_OTHER): Payer: Medicare PPO

## 2022-01-29 DIAGNOSIS — I495 Sick sinus syndrome: Secondary | ICD-10-CM | POA: Diagnosis not present

## 2022-01-29 LAB — CUP PACEART REMOTE DEVICE CHECK
Battery Remaining Longevity: 127 mo
Battery Voltage: 3.02 V
Brady Statistic AP VP Percent: 0.01 %
Brady Statistic AP VS Percent: 40.04 %
Brady Statistic AS VP Percent: 0 %
Brady Statistic AS VS Percent: 59.95 %
Brady Statistic RA Percent Paced: 39.96 %
Brady Statistic RV Percent Paced: 0.01 %
Date Time Interrogation Session: 20230619233737
Implantable Lead Implant Date: 20190905
Implantable Lead Implant Date: 20190905
Implantable Lead Location: 753859
Implantable Lead Location: 753860
Implantable Lead Model: 3830
Implantable Lead Model: 5076
Implantable Pulse Generator Implant Date: 20190905
Lead Channel Impedance Value: 304 Ohm
Lead Channel Impedance Value: 323 Ohm
Lead Channel Impedance Value: 399 Ohm
Lead Channel Impedance Value: 399 Ohm
Lead Channel Pacing Threshold Amplitude: 1 V
Lead Channel Pacing Threshold Pulse Width: 0.4 ms
Lead Channel Sensing Intrinsic Amplitude: 1.875 mV
Lead Channel Sensing Intrinsic Amplitude: 1.875 mV
Lead Channel Sensing Intrinsic Amplitude: 5.5 mV
Lead Channel Sensing Intrinsic Amplitude: 5.5 mV
Lead Channel Setting Pacing Amplitude: 2 V
Lead Channel Setting Pacing Amplitude: 2.5 V
Lead Channel Setting Pacing Pulse Width: 0.4 ms
Lead Channel Setting Sensing Sensitivity: 0.9 mV

## 2022-02-02 ENCOUNTER — Other Ambulatory Visit: Payer: Self-pay | Admitting: Cardiology

## 2022-02-02 DIAGNOSIS — I480 Paroxysmal atrial fibrillation: Secondary | ICD-10-CM

## 2022-02-05 ENCOUNTER — Telehealth: Payer: Self-pay | Admitting: *Deleted

## 2022-02-05 ENCOUNTER — Encounter: Payer: Self-pay | Admitting: Cardiology

## 2022-02-05 ENCOUNTER — Ambulatory Visit: Payer: Medicare PPO | Admitting: Cardiology

## 2022-02-05 VITALS — BP 130/68 | HR 71 | Ht 62.0 in | Wt 201.0 lb

## 2022-02-05 DIAGNOSIS — I10 Essential (primary) hypertension: Secondary | ICD-10-CM | POA: Diagnosis not present

## 2022-02-05 DIAGNOSIS — G4733 Obstructive sleep apnea (adult) (pediatric): Secondary | ICD-10-CM

## 2022-02-08 DIAGNOSIS — K760 Fatty (change of) liver, not elsewhere classified: Secondary | ICD-10-CM | POA: Diagnosis not present

## 2022-02-08 DIAGNOSIS — Z1331 Encounter for screening for depression: Secondary | ICD-10-CM | POA: Diagnosis not present

## 2022-02-08 DIAGNOSIS — Z1389 Encounter for screening for other disorder: Secondary | ICD-10-CM | POA: Diagnosis not present

## 2022-02-08 DIAGNOSIS — R269 Unspecified abnormalities of gait and mobility: Secondary | ICD-10-CM | POA: Diagnosis not present

## 2022-02-08 DIAGNOSIS — E785 Hyperlipidemia, unspecified: Secondary | ICD-10-CM | POA: Diagnosis not present

## 2022-02-08 DIAGNOSIS — F419 Anxiety disorder, unspecified: Secondary | ICD-10-CM | POA: Diagnosis not present

## 2022-02-08 DIAGNOSIS — N939 Abnormal uterine and vaginal bleeding, unspecified: Secondary | ICD-10-CM | POA: Diagnosis not present

## 2022-02-08 DIAGNOSIS — I700 Atherosclerosis of aorta: Secondary | ICD-10-CM | POA: Diagnosis not present

## 2022-02-08 DIAGNOSIS — Z0000 Encounter for general adult medical examination without abnormal findings: Secondary | ICD-10-CM | POA: Diagnosis not present

## 2022-02-08 DIAGNOSIS — E0500 Thyrotoxicosis with diffuse goiter without thyrotoxic crisis or storm: Secondary | ICD-10-CM | POA: Diagnosis not present

## 2022-02-13 DIAGNOSIS — M1711 Unilateral primary osteoarthritis, right knee: Secondary | ICD-10-CM | POA: Diagnosis not present

## 2022-02-13 NOTE — Progress Notes (Signed)
Remote pacemaker transmission.   

## 2022-02-15 DIAGNOSIS — I872 Venous insufficiency (chronic) (peripheral): Secondary | ICD-10-CM | POA: Diagnosis not present

## 2022-02-15 DIAGNOSIS — S81802A Unspecified open wound, left lower leg, initial encounter: Secondary | ICD-10-CM | POA: Diagnosis not present

## 2022-02-20 DIAGNOSIS — M1711 Unilateral primary osteoarthritis, right knee: Secondary | ICD-10-CM | POA: Diagnosis not present

## 2022-02-21 ENCOUNTER — Telehealth: Payer: Self-pay | Admitting: Student

## 2022-02-21 NOTE — Telephone Encounter (Signed)
  1. Has your device fired?   2. Is you device beeping? No  3. Are you experiencing draining or swelling at device site?   No  4. Are you calling to see if we received your device transmission? Yes  5. Have you passed out?   No   Patient called stating she was called because her pacemaker transmissions were not being received.  It was determined that the system had updated and they were having compatibility issue with her device. Patient was told they will providing her with an update.    Please route to [place]

## 2022-02-27 DIAGNOSIS — M1711 Unilateral primary osteoarthritis, right knee: Secondary | ICD-10-CM | POA: Diagnosis not present

## 2022-03-22 DIAGNOSIS — M545 Low back pain, unspecified: Secondary | ICD-10-CM | POA: Diagnosis not present

## 2022-03-22 DIAGNOSIS — I872 Venous insufficiency (chronic) (peripheral): Secondary | ICD-10-CM | POA: Diagnosis not present

## 2022-03-22 DIAGNOSIS — M546 Pain in thoracic spine: Secondary | ICD-10-CM | POA: Diagnosis not present

## 2022-03-22 DIAGNOSIS — Z7901 Long term (current) use of anticoagulants: Secondary | ICD-10-CM | POA: Diagnosis not present

## 2022-03-22 DIAGNOSIS — I480 Paroxysmal atrial fibrillation: Secondary | ICD-10-CM | POA: Diagnosis not present

## 2022-04-05 ENCOUNTER — Other Ambulatory Visit: Payer: Self-pay | Admitting: Internal Medicine

## 2022-04-05 DIAGNOSIS — I480 Paroxysmal atrial fibrillation: Secondary | ICD-10-CM

## 2022-04-08 DIAGNOSIS — C50911 Malignant neoplasm of unspecified site of right female breast: Secondary | ICD-10-CM | POA: Diagnosis not present

## 2022-04-17 DIAGNOSIS — C50911 Malignant neoplasm of unspecified site of right female breast: Secondary | ICD-10-CM | POA: Diagnosis not present

## 2022-04-17 DIAGNOSIS — M170 Bilateral primary osteoarthritis of knee: Secondary | ICD-10-CM | POA: Diagnosis not present

## 2022-04-17 DIAGNOSIS — M1712 Unilateral primary osteoarthritis, left knee: Secondary | ICD-10-CM | POA: Diagnosis not present

## 2022-04-17 DIAGNOSIS — M1711 Unilateral primary osteoarthritis, right knee: Secondary | ICD-10-CM | POA: Diagnosis not present

## 2022-04-23 DIAGNOSIS — Z7901 Long term (current) use of anticoagulants: Secondary | ICD-10-CM | POA: Diagnosis not present

## 2022-04-23 DIAGNOSIS — I480 Paroxysmal atrial fibrillation: Secondary | ICD-10-CM | POA: Diagnosis not present

## 2022-04-25 DIAGNOSIS — C50911 Malignant neoplasm of unspecified site of right female breast: Secondary | ICD-10-CM | POA: Diagnosis not present

## 2022-04-30 ENCOUNTER — Ambulatory Visit (INDEPENDENT_AMBULATORY_CARE_PROVIDER_SITE_OTHER): Payer: Medicare PPO

## 2022-04-30 DIAGNOSIS — I495 Sick sinus syndrome: Secondary | ICD-10-CM

## 2022-04-30 LAB — CUP PACEART REMOTE DEVICE CHECK
Battery Remaining Longevity: 126 mo
Battery Voltage: 3.01 V
Brady Statistic AP VP Percent: 0 %
Brady Statistic AP VS Percent: 64.42 %
Brady Statistic AS VP Percent: 0 %
Brady Statistic AS VS Percent: 35.58 %
Brady Statistic RA Percent Paced: 64.35 %
Brady Statistic RV Percent Paced: 0 %
Date Time Interrogation Session: 20230918232548
Implantable Lead Implant Date: 20190905
Implantable Lead Implant Date: 20190905
Implantable Lead Location: 753859
Implantable Lead Location: 753860
Implantable Lead Model: 3830
Implantable Lead Model: 5076
Implantable Pulse Generator Implant Date: 20190905
Lead Channel Impedance Value: 304 Ohm
Lead Channel Impedance Value: 361 Ohm
Lead Channel Impedance Value: 418 Ohm
Lead Channel Impedance Value: 418 Ohm
Lead Channel Pacing Threshold Amplitude: 0.875 V
Lead Channel Pacing Threshold Pulse Width: 0.4 ms
Lead Channel Sensing Intrinsic Amplitude: 0.875 mV
Lead Channel Sensing Intrinsic Amplitude: 0.875 mV
Lead Channel Sensing Intrinsic Amplitude: 6.375 mV
Lead Channel Sensing Intrinsic Amplitude: 6.375 mV
Lead Channel Setting Pacing Amplitude: 1.75 V
Lead Channel Setting Pacing Amplitude: 2.5 V
Lead Channel Setting Pacing Pulse Width: 0.4 ms
Lead Channel Setting Sensing Sensitivity: 0.9 mV

## 2022-05-14 NOTE — Progress Notes (Signed)
Remote pacemaker transmission.   

## 2022-05-28 DIAGNOSIS — I480 Paroxysmal atrial fibrillation: Secondary | ICD-10-CM | POA: Diagnosis not present

## 2022-05-28 DIAGNOSIS — Z23 Encounter for immunization: Secondary | ICD-10-CM | POA: Diagnosis not present

## 2022-05-28 DIAGNOSIS — Z7901 Long term (current) use of anticoagulants: Secondary | ICD-10-CM | POA: Diagnosis not present

## 2022-06-11 DIAGNOSIS — Z23 Encounter for immunization: Secondary | ICD-10-CM | POA: Diagnosis not present

## 2022-06-25 DIAGNOSIS — Z7901 Long term (current) use of anticoagulants: Secondary | ICD-10-CM | POA: Diagnosis not present

## 2022-06-25 DIAGNOSIS — I480 Paroxysmal atrial fibrillation: Secondary | ICD-10-CM | POA: Diagnosis not present

## 2022-07-25 DIAGNOSIS — Z7901 Long term (current) use of anticoagulants: Secondary | ICD-10-CM | POA: Diagnosis not present

## 2022-07-25 DIAGNOSIS — I480 Paroxysmal atrial fibrillation: Secondary | ICD-10-CM | POA: Diagnosis not present

## 2022-07-30 ENCOUNTER — Ambulatory Visit (INDEPENDENT_AMBULATORY_CARE_PROVIDER_SITE_OTHER): Payer: Medicare PPO

## 2022-07-30 DIAGNOSIS — I495 Sick sinus syndrome: Secondary | ICD-10-CM | POA: Diagnosis not present

## 2022-07-30 LAB — CUP PACEART REMOTE DEVICE CHECK
Battery Remaining Longevity: 124 mo
Battery Voltage: 3.01 V
Brady Statistic AP VP Percent: 0 %
Brady Statistic AP VS Percent: 46.72 %
Brady Statistic AS VP Percent: 0 %
Brady Statistic AS VS Percent: 53.27 %
Brady Statistic RA Percent Paced: 46.66 %
Brady Statistic RV Percent Paced: 0 %
Date Time Interrogation Session: 20231218205515
Implantable Lead Connection Status: 753985
Implantable Lead Connection Status: 753985
Implantable Lead Implant Date: 20190905
Implantable Lead Implant Date: 20190905
Implantable Lead Location: 753859
Implantable Lead Location: 753860
Implantable Lead Model: 3830
Implantable Lead Model: 5076
Implantable Pulse Generator Implant Date: 20190905
Lead Channel Impedance Value: 285 Ohm
Lead Channel Impedance Value: 323 Ohm
Lead Channel Impedance Value: 399 Ohm
Lead Channel Impedance Value: 418 Ohm
Lead Channel Pacing Threshold Amplitude: 0.75 V
Lead Channel Pacing Threshold Pulse Width: 0.4 ms
Lead Channel Sensing Intrinsic Amplitude: 1.5 mV
Lead Channel Sensing Intrinsic Amplitude: 1.5 mV
Lead Channel Sensing Intrinsic Amplitude: 5.25 mV
Lead Channel Sensing Intrinsic Amplitude: 5.25 mV
Lead Channel Setting Pacing Amplitude: 1.5 V
Lead Channel Setting Pacing Amplitude: 2.5 V
Lead Channel Setting Pacing Pulse Width: 0.4 ms
Lead Channel Setting Sensing Sensitivity: 0.9 mV
Zone Setting Status: 755011
Zone Setting Status: 755011

## 2022-08-08 DIAGNOSIS — Z1231 Encounter for screening mammogram for malignant neoplasm of breast: Secondary | ICD-10-CM | POA: Diagnosis not present

## 2022-08-09 ENCOUNTER — Encounter (HOSPITAL_BASED_OUTPATIENT_CLINIC_OR_DEPARTMENT_OTHER): Payer: Self-pay | Admitting: *Deleted

## 2022-08-16 DIAGNOSIS — Z853 Personal history of malignant neoplasm of breast: Secondary | ICD-10-CM | POA: Diagnosis not present

## 2022-08-16 DIAGNOSIS — E0500 Thyrotoxicosis with diffuse goiter without thyrotoxic crisis or storm: Secondary | ICD-10-CM | POA: Diagnosis not present

## 2022-08-16 DIAGNOSIS — R7302 Impaired glucose tolerance (oral): Secondary | ICD-10-CM | POA: Diagnosis not present

## 2022-08-16 DIAGNOSIS — K760 Fatty (change of) liver, not elsewhere classified: Secondary | ICD-10-CM | POA: Diagnosis not present

## 2022-08-16 DIAGNOSIS — I10 Essential (primary) hypertension: Secondary | ICD-10-CM | POA: Diagnosis not present

## 2022-08-16 DIAGNOSIS — I700 Atherosclerosis of aorta: Secondary | ICD-10-CM | POA: Diagnosis not present

## 2022-08-16 DIAGNOSIS — E785 Hyperlipidemia, unspecified: Secondary | ICD-10-CM | POA: Diagnosis not present

## 2022-08-16 DIAGNOSIS — I480 Paroxysmal atrial fibrillation: Secondary | ICD-10-CM | POA: Diagnosis not present

## 2022-08-26 NOTE — Progress Notes (Signed)
Remote pacemaker transmission.   

## 2022-08-29 DIAGNOSIS — Z7901 Long term (current) use of anticoagulants: Secondary | ICD-10-CM | POA: Diagnosis not present

## 2022-08-29 DIAGNOSIS — I480 Paroxysmal atrial fibrillation: Secondary | ICD-10-CM | POA: Diagnosis not present

## 2022-08-31 NOTE — Progress Notes (Signed)
Cardiology Office Note Date:  09/03/2022  Patient ID:  Courtney Dunlap, DOB Feb 21, 1951, MRN [id number] PCP:  Reynold Bowen, MD  Cardiologist:  Fransico Him, MD  Electrophysiologist: Thompson Grayer, MD >> Courtney Quitter, MD  Chief Complaint: 72mo follow-up, past due  History of Present Illness: [name] a 72y.o. female with PMH notable for persistent Afib, tachy-brady syndrome s/p PM, SVT s/p ablation, OSA; seen today for [name] for routine electrophysiology followup.  Last saw PA Tillery 10/2021, feeling well, no changes Last saw Dr. TRadford Pax6/2023, not using CPAP - had been caring for sister at sister's house and did not bring supplies. Sister recently passed and still not using CPAP.  Since last being seen in our clinic the patient reports doing well. She is able to work in garden without SOB. She does have slight SOB with walking long distances. This is stable, not worsening. Denies other concerning symptoms accompanied with SOB. Has a "roller coaster" sensation in her heart that happens about 1/month. Episodes are very brief.  Diligent about taking flecainide BID and dilt daily.    she denies chest pain, palpitations, dyspnea, PND, orthopnea, nausea, vomiting, dizziness, syncope, edema, weight gain, or early satiety.    Device Information: MDT dual-chamber PPM, imp 04/2018; dx- symptomatic bradycardia  AAD History: Sotalol, ineffective Flecainide   Past Medical History:  Diagnosis Date   Allergic dermatitis    Anxiety    Arthritis    back, knees & ankles    Atrial flutter ([place] 05/25/2016   Benign essential HTN 02/04/2014   [place]ast cancer ([place]    Cancer ([place]    skin- lower abdomen   Depression    Dyslipidemia 08/16/2011   Family history of breast cancer    Genetic testing 05/12/2015   Negative genetic testing on the Breast/Ovarian cancer panel.  The Breast/Ovarian gene panel offered by [facility] includes sequencing and rearrangement analysis for the  following 20 genes:  ATM, BARD1, BRCA1, BRCA2, BRIP1, CDH1, CHEK2, EPCAM, FANCC, MLH1, MSH2, MSH6, NBN, PALB2, PMS2, PTEN, RAD51C, RAD51D, TP53, and XRCC2.   The report date is May 11, 2015.    GERD (gastroesophageal reflux disease)    Hiatal hernia    with GERD   Hypercholesteremia    Hyperthyroidism    pt. reports that she thinks she has hypothyroidism, states [place] past [place]2 yrs.    Malignant neoplasm of upper-outer quadrant of right breast in female, estrogen receptor positive ([facility] 04/24/2015   Mild cognitive impairment with memory loss    Morbid obesity ([facility]    OSA (obstructive sleep apnea) 02/04/2014   Persistent atrial fibrillation (HCC)    Sleep apnea    severe with AHI 37/hr now on CPAP at 13cm H2O   SVT (supraventricular tachycardia)    likely due to atrial flutter.  successfullly ablated by Dr TLovena Le10/17   Tachycardia-bradycardia syndrome ([place] 05/04/2018   S/P MDT PPM    Past Surgical History:  Procedure Laterality Date   ABLATION  06/2016   Dr. KDelilah Shan  APPENDECTOMY  28 years ago   DDar[place]   Polyp resection    ELECTROPHYSIOLOGIC STUDY N/A 05/27/2016   Procedure: EPS/SVT Ablation;  Surgeon: GEvans Lance MD;  [facility];  Service: Cardiovascular;  Laterality: N/A;   HYSTEROSCOPY  4/12   D&C   PACEMAKER IMPLANT N/A 04/16/2018   Medtronic Azure XT  DR MRI SureScan PPM implanted by Dr Rayann Heman for afib with post termination pauses/ sick sinus syndrome   RADIOACTIVE SEED GUIDED PARTIAL MASTECTOMY WITH AXILLARY SENTINEL LYMPH NODE BIOPSY Right 05/18/2015   Procedure: RIGHT BREAST RADIOACTIVE SEED PARTIAL MASTECTOMY WITH RIGHT SENTINEL LYMPH NODE MAPPING;  Surgeon: Erroll Luna, MD;  Location: [facility];  Service: General;  Laterality: Right;   TONSILLECTOMY     TUBAL LIGATION      Current Outpatient Medications  Medication Instructions   acetaminophen (TYLENOL) 500 mg, Oral, Every 6 hours  PRN   atorvastatin (LIPITOR) 20 mg, Oral, Daily after breakfast   cholecalciferol (VITAMIN D) 1,000 Units, Oral, Daily   cyanocobalamin (VITAMIN B12) 1,000 mcg, Oral, Daily   diclofenac Sodium (VOLTAREN) 1 % GEL diclofenac 1 % topical gel  APPLY AA QID   diltiazem (CARDIZEM CD) 120 MG 24 hr capsule TAKE 1 CAPSULE(120 MG) BY MOUTH DAILY   Fish Oil 1,000 mg, Oral, Daily   flecainide (TAMBOCOR) 100 mg, Oral, 2 times daily   hydrochlorothiazide (HYDRODIURIL) 25 mg, Oral, Daily, Please call and schedule an appointment for further refills 2nd attempt   triamcinolone cream (KENALOG) 0.1 % As needed   warfarin (COUMADIN) 7.5 mg, Oral, Daily, Resume on 05/30/2016    Social History:  The patient  reports that she quit smoking about 48 years ago. Her smoking use included cigarettes. She has never used smokeless tobacco. She reports current alcohol use of about 3.0 standard drinks of alcohol per week. She reports that she does not use drugs.   Family History:  The patient's family history includes Brain cancer in her mother; Breast cancer in her paternal aunt and paternal aunt; Diabetes in her maternal grandmother; Heart attack in her maternal grandmother; Hypertension in her brother, mother, sister, and sister; Melanoma in her paternal aunt; Multiple myeloma in her paternal aunt; Other (age of onset: 61) in her daughter; Thyroid disease in her brother and sister.  ROS:  Please see the history of present illness. All other systems are reviewed and otherwise negative.   PHYSICAL EXAM:  VS:  BP (!) 140/68   Pulse 75   Ht '5\' 4"'$  (1.626 m)   Wt 205 lb (93 kg)   LMP 11/21/2010 Comment: spotting-had hysteroscopy/d&c  SpO2 97%   BMI 35.19 kg/m  BMI: Body mass index is 35.19 kg/m.  GEN- The patient is well appearing, alert and oriented x 3 today.   HEENT: normocephalic, atraumatic; sclera clear, conjunctiva pink; hearing intact; oropharynx clear; neck supple, no JVP Lungs- Clear to ausculation  bilaterally, normal work of breathing.  No wheezes, rales, rhonchi Heart- Regular rate and rhythm, no murmurs, rubs or gallops, PMI not laterally displaced GI- soft, non-tender, non-distended, bowel sounds present, no hepatosplenomegaly Extremities- Trace peripheral edema. no clubbing or cyanosis; DP/PT/radial pulses 2+ bilaterally MS- no significant deformity or atrophy Skin- warm and dry, no rash or lesion, device pocket well-healed Psych- euthymic mood, full affect Neuro- strength and sensation are intact   Device interrogation done today and reviewed by myself:  Battery good Lead thresholds, impedence, sensing stable  1 brief NSVT and 1 brief SVT episodes No changes made today -- during device testing, patient endorsed the same "roller coaster" symptoms that she occasionally feels   EKG is ordered. Personal review of EKG from today shows: A-paced, rate 67bpm, RBBB  Recent Labs: No results found for requested labs within last 365 days.  No results found for requested labs within last 365 days.   CrCl cannot  be calculated (Patient's most recent lab result is older than the maximum 21 days allowed.).   Wt Readings from Last 3 Encounters:  09/03/22 205 lb (93 kg)  02/05/22 201 lb (91.2 kg)  12/24/21 201 lb 1 oz (91.2 kg)     Additional studies reviewed include: Previous EP, cardiology notes.   TTE 04/16/2018 - Left ventricle: The cavity size was normal. Systolic function was vigorous. The estimated ejection fraction was in the range of 65% to 70%. Wall motion was normal; there were no regional wall motion abnormalities. Doppler parameters are consistent with abnormal left ventricular relaxation (grade 1 diastolic dysfunction). There was no evidence of elevated ventricular filling pressure by Doppler parameters. - Aortic valve: There was no regurgitation. - Aortic root: The aortic root was normal in size. - Mitral valve: There was mild regurgitation. - Left atrium: The atrium  was mildly dilated. - Right atrium: The atrium was normal in size. - Tricuspid valve: There was no regurgitation. - Pulmonic valve: There was no regurgitation. - Pulmonary arteries: Systolic pressure was within the normal range. - Inferior vena cava: The vessel was normal in size. - Pericardium, extracardiac: There was no pericardial effusion.  Afl/Afib/SVT ablation, 06/06/16 1. Isthmus-dependent right atrial flutter upon presentation.  2. Successful radiofrequency ablation of atrial flutter along the cavotricuspid isthmus with complete bidirectional isthmus block achieved.  3. No inducible arrhythmias following ablation.  4. No early apparent complications.   ASSESSMENT AND PLAN:  #) SVT  parox Afib Minimal burden by device On flecainide + diltiazem Stable intervals on EKG  #) hypercoag due to AFib CHA2DS2-VASc Score = 3 [CHF History: 0, HTN History: 1, Diabetes History: 0, Stroke History: 0, Vascular Disease History: 0, Age Score: 1, Gender Score: 1].  Therefore, the patient's annual risk of stroke is 3.2 %. [place] - warfarin  #) Tachy-brady s/p PPM Device functioning well, see paceart for details No changes made today "Roller coaster" sensation occurred during device testing, reassurance provided to patient  #) OSA on CPAP Encouraged continued use  #) elevated BP without diagnosis of HTN Vitals:   09/03/22 0903 09/03/22 1237  BP: (!) 142/74 (!) 140/68  Pulse: 75   Height: '5\' 4"'$  (1.626 m)   Weight: 205 lb (93 kg)   SpO2: 97%   BMI (Calculated): 35.17    Patient's BP slightly elevated during office appt, still elevated on recheck. I encouraged her to obtain a BP cuff and check BP at home a couple times a week and discuss medication adjustments with PCP  #) Transfer care Patient is aware Dr. Rayann Heman is no longer seeing patients at our clinic. She is agreeable to see Dr. Myles Gip going forward.   Current medicines are reviewed at length with the patient today.   The  patient does not have concerns regarding her medicines.  The following changes were made today:  none  Labs/ tests ordered today include:  Orders Placed This Encounter  Procedures   CUP [place]   EKG 12-Lead     Disposition: Follow up with Dr. Myles Gip or APP in in 6 months   Signed, Mamie Levers, NP  09/03/22  12:37 PM  Electrophysiology [place]

## 2022-09-03 ENCOUNTER — Encounter: Payer: Self-pay | Admitting: Student

## 2022-09-03 ENCOUNTER — Ambulatory Visit: Payer: Medicare PPO | Attending: Student | Admitting: Cardiology

## 2022-09-03 VITALS — BP 140/68 | HR 75 | Ht 64.0 in | Wt 205.0 lb

## 2022-09-03 DIAGNOSIS — I480 Paroxysmal atrial fibrillation: Secondary | ICD-10-CM

## 2022-09-03 DIAGNOSIS — G4733 Obstructive sleep apnea (adult) (pediatric): Secondary | ICD-10-CM

## 2022-09-03 DIAGNOSIS — D6869 Other thrombophilia: Secondary | ICD-10-CM | POA: Diagnosis not present

## 2022-09-03 DIAGNOSIS — I471 Supraventricular tachycardia, unspecified: Secondary | ICD-10-CM | POA: Diagnosis not present

## 2022-09-03 DIAGNOSIS — I495 Sick sinus syndrome: Secondary | ICD-10-CM

## 2022-09-03 DIAGNOSIS — R030 Elevated blood-pressure reading, without diagnosis of hypertension: Secondary | ICD-10-CM

## 2022-09-03 DIAGNOSIS — I4819 Other persistent atrial fibrillation: Secondary | ICD-10-CM

## 2022-09-03 DIAGNOSIS — Z950 Presence of cardiac pacemaker: Secondary | ICD-10-CM

## 2022-09-03 LAB — CUP PACEART INCLINIC DEVICE CHECK
Date Time Interrogation Session: 20240123120914
Implantable Lead Connection Status: 753985
Implantable Lead Connection Status: 753985
Implantable Lead Implant Date: 20190905
Implantable Lead Implant Date: 20190905
Implantable Lead Location: 753859
Implantable Lead Location: 753860
Implantable Lead Model: 3830
Implantable Lead Model: 5076
Implantable Pulse Generator Implant Date: 20190905

## 2022-09-03 NOTE — Patient Instructions (Addendum)
Medication Instructions:   Your physician recommends that you continue on your current medications as directed. Please refer to the Current Medication list given to you today.    *If you need a refill on your cardiac medications before your next appointment, please call your pharmacy*   Lab Work:  None ordered.  If you have labs (blood work) drawn today and your tests are completely normal, you will receive your results only by: [place] (if you have MyChart) OR A paper copy in the mail If you have any lab test that is abnormal or we need to change your treatment, we will call you to review the results.   Testing/Procedures:  None ordered.   Follow-Up: At [facility], you and your health needs are our priority.  As part of our continuing mission to provide you with exceptional heart care, we have created designated Provider Care Teams.  These Care Teams include your primary Cardiologist (physician) and Advanced Practice Providers (APPs -  Physician Assistants and Nurse Practitioners) who all work together to provide you with the care you need, when you need it.  We recommend signing up for the patient portal called "MyChart".  Sign up information is provided on this After Visit Summary.  MyChart is used to connect with patients for Virtual Visits (Telemedicine).  Patients are able to view lab/test results, encounter notes, upcoming appointments, etc.  Non-urgent messages can be sent to your provider as well.   To learn more about what you can do with MyChart, go to [email or web address].    Your next appointment:   6 month(s)  Provider:   You may see Melida Quitter, MD or one of the following Advanced Practice Providers on your designated Care Team:   Tommye Standard, [place] Beryle Beams" [place], [place] Mamie Levers, NP    Other Instructions  Your physician wants you to follow-up in: 6 months.  You will receive a reminder letter in the mail two months in  advance. If you don't receive a letter, please call our office to schedule the follow-up appointment.

## 2022-09-18 DIAGNOSIS — Z7901 Long term (current) use of anticoagulants: Secondary | ICD-10-CM | POA: Diagnosis not present

## 2022-09-18 DIAGNOSIS — R197 Diarrhea, unspecified: Secondary | ICD-10-CM | POA: Diagnosis not present

## 2022-09-18 DIAGNOSIS — I10 Essential (primary) hypertension: Secondary | ICD-10-CM | POA: Diagnosis not present

## 2022-09-18 DIAGNOSIS — I480 Paroxysmal atrial fibrillation: Secondary | ICD-10-CM | POA: Diagnosis not present

## 2022-09-18 DIAGNOSIS — R0989 Other specified symptoms and signs involving the circulatory and respiratory systems: Secondary | ICD-10-CM | POA: Diagnosis not present

## 2022-09-18 DIAGNOSIS — R059 Cough, unspecified: Secondary | ICD-10-CM | POA: Diagnosis not present

## 2022-09-18 DIAGNOSIS — L509 Urticaria, unspecified: Secondary | ICD-10-CM | POA: Diagnosis not present

## 2022-09-18 DIAGNOSIS — F419 Anxiety disorder, unspecified: Secondary | ICD-10-CM | POA: Diagnosis not present

## 2022-09-18 DIAGNOSIS — G4730 Sleep apnea, unspecified: Secondary | ICD-10-CM | POA: Diagnosis not present

## 2022-09-19 DIAGNOSIS — I480 Paroxysmal atrial fibrillation: Secondary | ICD-10-CM | POA: Diagnosis not present

## 2022-09-19 DIAGNOSIS — Z7901 Long term (current) use of anticoagulants: Secondary | ICD-10-CM | POA: Diagnosis not present

## 2022-09-19 DIAGNOSIS — G4730 Sleep apnea, unspecified: Secondary | ICD-10-CM | POA: Diagnosis not present

## 2022-09-19 DIAGNOSIS — R0989 Other specified symptoms and signs involving the circulatory and respiratory systems: Secondary | ICD-10-CM | POA: Diagnosis not present

## 2022-09-19 DIAGNOSIS — I10 Essential (primary) hypertension: Secondary | ICD-10-CM | POA: Diagnosis not present

## 2022-09-19 DIAGNOSIS — R197 Diarrhea, unspecified: Secondary | ICD-10-CM | POA: Diagnosis not present

## 2022-10-02 DIAGNOSIS — I480 Paroxysmal atrial fibrillation: Secondary | ICD-10-CM | POA: Diagnosis not present

## 2022-10-02 DIAGNOSIS — Z7901 Long term (current) use of anticoagulants: Secondary | ICD-10-CM | POA: Diagnosis not present

## 2022-10-16 DIAGNOSIS — M7989 Other specified soft tissue disorders: Secondary | ICD-10-CM | POA: Diagnosis not present

## 2022-10-16 DIAGNOSIS — I480 Paroxysmal atrial fibrillation: Secondary | ICD-10-CM | POA: Diagnosis not present

## 2022-10-16 DIAGNOSIS — M79672 Pain in left foot: Secondary | ICD-10-CM | POA: Diagnosis not present

## 2022-10-16 DIAGNOSIS — I10 Essential (primary) hypertension: Secondary | ICD-10-CM | POA: Diagnosis not present

## 2022-10-28 ENCOUNTER — Other Ambulatory Visit: Payer: Self-pay | Admitting: Cardiology

## 2022-10-28 DIAGNOSIS — I480 Paroxysmal atrial fibrillation: Secondary | ICD-10-CM

## 2022-10-29 ENCOUNTER — Ambulatory Visit (INDEPENDENT_AMBULATORY_CARE_PROVIDER_SITE_OTHER): Payer: Medicare PPO

## 2022-10-29 DIAGNOSIS — I495 Sick sinus syndrome: Secondary | ICD-10-CM | POA: Diagnosis not present

## 2022-10-30 LAB — CUP PACEART REMOTE DEVICE CHECK
Battery Remaining Longevity: 119 mo
Battery Voltage: 3.01 V
Brady Statistic AP VP Percent: 0 %
Brady Statistic AP VS Percent: 68.87 %
Brady Statistic AS VP Percent: 0 %
Brady Statistic AS VS Percent: 31.13 %
Brady Statistic RA Percent Paced: 68.8 %
Brady Statistic RV Percent Paced: 0 %
Date Time Interrogation Session: 20240319015124
Implantable Lead Connection Status: 753985
Implantable Lead Connection Status: 753985
Implantable Lead Implant Date: 20190905
Implantable Lead Implant Date: 20190905
Implantable Lead Location: 753859
Implantable Lead Location: 753860
Implantable Lead Model: 3830
Implantable Lead Model: 5076
Implantable Pulse Generator Implant Date: 20190905
Lead Channel Impedance Value: 266 Ohm
Lead Channel Impedance Value: 304 Ohm
Lead Channel Impedance Value: 380 Ohm
Lead Channel Impedance Value: 399 Ohm
Lead Channel Pacing Threshold Amplitude: 0.875 V
Lead Channel Pacing Threshold Pulse Width: 0.4 ms
Lead Channel Sensing Intrinsic Amplitude: 1.25 mV
Lead Channel Sensing Intrinsic Amplitude: 1.25 mV
Lead Channel Sensing Intrinsic Amplitude: 5.25 mV
Lead Channel Sensing Intrinsic Amplitude: 5.25 mV
Lead Channel Setting Pacing Amplitude: 1.75 V
Lead Channel Setting Pacing Amplitude: 2.5 V
Lead Channel Setting Pacing Pulse Width: 0.4 ms
Lead Channel Setting Sensing Sensitivity: 0.9 mV
Zone Setting Status: 755011
Zone Setting Status: 755011

## 2022-10-31 DIAGNOSIS — I480 Paroxysmal atrial fibrillation: Secondary | ICD-10-CM | POA: Diagnosis not present

## 2022-10-31 DIAGNOSIS — Z7901 Long term (current) use of anticoagulants: Secondary | ICD-10-CM | POA: Diagnosis not present

## 2022-12-03 DIAGNOSIS — I480 Paroxysmal atrial fibrillation: Secondary | ICD-10-CM | POA: Diagnosis not present

## 2022-12-03 DIAGNOSIS — Z7901 Long term (current) use of anticoagulants: Secondary | ICD-10-CM | POA: Diagnosis not present

## 2022-12-09 NOTE — Progress Notes (Signed)
Remote pacemaker transmission.   

## 2022-12-28 ENCOUNTER — Other Ambulatory Visit: Payer: Self-pay | Admitting: Student

## 2022-12-28 DIAGNOSIS — I480 Paroxysmal atrial fibrillation: Secondary | ICD-10-CM

## 2022-12-31 DIAGNOSIS — Z7901 Long term (current) use of anticoagulants: Secondary | ICD-10-CM | POA: Diagnosis not present

## 2022-12-31 DIAGNOSIS — I480 Paroxysmal atrial fibrillation: Secondary | ICD-10-CM | POA: Diagnosis not present

## 2022-12-31 DIAGNOSIS — R7302 Impaired glucose tolerance (oral): Secondary | ICD-10-CM | POA: Diagnosis not present

## 2023-01-14 DIAGNOSIS — I480 Paroxysmal atrial fibrillation: Secondary | ICD-10-CM | POA: Diagnosis not present

## 2023-01-14 DIAGNOSIS — Z7901 Long term (current) use of anticoagulants: Secondary | ICD-10-CM | POA: Diagnosis not present

## 2023-01-28 ENCOUNTER — Ambulatory Visit: Payer: Medicare PPO

## 2023-01-28 DIAGNOSIS — I495 Sick sinus syndrome: Secondary | ICD-10-CM

## 2023-01-29 LAB — CUP PACEART REMOTE DEVICE CHECK
Battery Remaining Longevity: 114 mo
Battery Voltage: 3 V
Brady Statistic AP VP Percent: 0 %
Brady Statistic AP VS Percent: 71.43 %
Brady Statistic AS VP Percent: 0 %
Brady Statistic AS VS Percent: 28.56 %
Brady Statistic RA Percent Paced: 71.41 %
Brady Statistic RV Percent Paced: 0 %
Date Time Interrogation Session: 20240618002108
Implantable Lead Connection Status: 753985
Implantable Lead Connection Status: 753985
Implantable Lead Implant Date: 20190905
Implantable Lead Implant Date: 20190905
Implantable Lead Location: 753859
Implantable Lead Location: 753860
Implantable Lead Model: 3830
Implantable Lead Model: 5076
Implantable Pulse Generator Implant Date: 20190905
Lead Channel Impedance Value: 285 Ohm
Lead Channel Impedance Value: 323 Ohm
Lead Channel Impedance Value: 380 Ohm
Lead Channel Impedance Value: 399 Ohm
Lead Channel Pacing Threshold Amplitude: 1 V
Lead Channel Pacing Threshold Pulse Width: 0.4 ms
Lead Channel Sensing Intrinsic Amplitude: 2.375 mV
Lead Channel Sensing Intrinsic Amplitude: 2.375 mV
Lead Channel Sensing Intrinsic Amplitude: 5.875 mV
Lead Channel Sensing Intrinsic Amplitude: 5.875 mV
Lead Channel Setting Pacing Amplitude: 2 V
Lead Channel Setting Pacing Amplitude: 2.5 V
Lead Channel Setting Pacing Pulse Width: 0.4 ms
Lead Channel Setting Sensing Sensitivity: 0.9 mV
Zone Setting Status: 755011
Zone Setting Status: 755011

## 2023-02-03 ENCOUNTER — Ambulatory Visit: Payer: Medicare PPO | Admitting: Cardiology

## 2023-02-18 DIAGNOSIS — E785 Hyperlipidemia, unspecified: Secondary | ICD-10-CM | POA: Diagnosis not present

## 2023-02-18 DIAGNOSIS — E0500 Thyrotoxicosis with diffuse goiter without thyrotoxic crisis or storm: Secondary | ICD-10-CM | POA: Diagnosis not present

## 2023-02-18 DIAGNOSIS — I10 Essential (primary) hypertension: Secondary | ICD-10-CM | POA: Diagnosis not present

## 2023-02-18 DIAGNOSIS — E559 Vitamin D deficiency, unspecified: Secondary | ICD-10-CM | POA: Diagnosis not present

## 2023-02-18 DIAGNOSIS — R7302 Impaired glucose tolerance (oral): Secondary | ICD-10-CM | POA: Diagnosis not present

## 2023-02-19 NOTE — Progress Notes (Signed)
Remote pacemaker transmission.   

## 2023-02-20 DIAGNOSIS — I480 Paroxysmal atrial fibrillation: Secondary | ICD-10-CM | POA: Diagnosis not present

## 2023-02-20 DIAGNOSIS — I10 Essential (primary) hypertension: Secondary | ICD-10-CM | POA: Diagnosis not present

## 2023-02-20 DIAGNOSIS — Z008 Encounter for other general examination: Secondary | ICD-10-CM | POA: Diagnosis not present

## 2023-02-20 DIAGNOSIS — Z1339 Encounter for screening examination for other mental health and behavioral disorders: Secondary | ICD-10-CM | POA: Diagnosis not present

## 2023-02-20 DIAGNOSIS — Z1331 Encounter for screening for depression: Secondary | ICD-10-CM | POA: Diagnosis not present

## 2023-02-20 DIAGNOSIS — E785 Hyperlipidemia, unspecified: Secondary | ICD-10-CM | POA: Diagnosis not present

## 2023-02-20 DIAGNOSIS — Z7901 Long term (current) use of anticoagulants: Secondary | ICD-10-CM | POA: Diagnosis not present

## 2023-02-20 DIAGNOSIS — I700 Atherosclerosis of aorta: Secondary | ICD-10-CM | POA: Diagnosis not present

## 2023-02-20 DIAGNOSIS — R82998 Other abnormal findings in urine: Secondary | ICD-10-CM | POA: Diagnosis not present

## 2023-02-20 DIAGNOSIS — F4321 Adjustment disorder with depressed mood: Secondary | ICD-10-CM | POA: Diagnosis not present

## 2023-02-21 DIAGNOSIS — Z1212 Encounter for screening for malignant neoplasm of rectum: Secondary | ICD-10-CM | POA: Diagnosis not present

## 2023-02-25 ENCOUNTER — Telehealth: Payer: Self-pay | Admitting: Cardiology

## 2023-02-25 NOTE — Telephone Encounter (Signed)
Transmission received and reviewed.  Pacemaker function WNL.  No episodes noted.

## 2023-02-25 NOTE — Telephone Encounter (Signed)
 Left message for patient to call back  

## 2023-02-25 NOTE — Telephone Encounter (Signed)
Pt is requesting a callback regarding her just not feeling well. She stated she contacted her pcp and she was advised to contact the office. Her BP is 145/73 and HR is 72 but no symptoms. She stated something just seems off and she's been to the bathroom 4 times but she's not in any pain. Please advise

## 2023-02-25 NOTE — Telephone Encounter (Signed)
Pt returning nurse's call. Please advise

## 2023-02-25 NOTE — Telephone Encounter (Signed)
Called patient back to discuss patient's symptoms. Patient complaining of diarrhea, feeling shaky, palpitations, racing heart, dizziness, and just not feeling well. Patient reported her highest BP and HR,  145/73  76. Patient has a device. Will send message to device clinic to see if they see anything going on with patient on their end. Patient tested for COVID and stated it was negative. Will also send message to Dr. Mayford Knife for advisement.

## 2023-02-25 NOTE — Telephone Encounter (Signed)
 LMTCB

## 2023-02-25 NOTE — Telephone Encounter (Signed)
Pt called back and she found her monitor and was able to send a transmission and she would like a call back

## 2023-02-25 NOTE — Telephone Encounter (Signed)
Spoke with Pt.  She does not know if she has a remote monitor.  She looked for it but could not see one in her bedroom.  Remote transmission received 01/28/2023-so Pt does have a monitor that is functioning.  Per Pt her symptoms have resolved since this AM.  She does not want to come to office for manual check this afternoon.  She will continue to monitor symptoms overnight and return this nurse's call tomorrow AM.  Advised will bring her in for a manual check if they reoccur.  Pt in agreement with this plan.

## 2023-02-25 NOTE — Telephone Encounter (Signed)
I tried to get the transmission from the patient but was unsuccessful. The more I described the monitor the more confused she got.

## 2023-02-25 NOTE — Telephone Encounter (Signed)
Spoke to Pt and let her know that Dr.Turner felt she needed to contact her PCP for further evaluation of her symptoms. Pt stated understanding.    Courtney Reichert, MD  Ethelda Chick, RN; [facility] [phone] minutes ago (3:39 PM)   Needs to see PCP due to multiple sx

## 2023-03-10 ENCOUNTER — Ambulatory Visit: Payer: Medicare PPO | Admitting: Cardiology

## 2023-03-10 ENCOUNTER — Encounter: Payer: Self-pay | Admitting: Cardiology

## 2023-03-10 VITALS — BP 122/70 | HR 82 | Ht 64.0 in | Wt 189.4 lb

## 2023-03-10 DIAGNOSIS — G4733 Obstructive sleep apnea (adult) (pediatric): Secondary | ICD-10-CM | POA: Diagnosis not present

## 2023-03-10 DIAGNOSIS — Z79899 Other long term (current) drug therapy: Secondary | ICD-10-CM

## 2023-03-10 DIAGNOSIS — I471 Supraventricular tachycardia, unspecified: Secondary | ICD-10-CM | POA: Diagnosis not present

## 2023-03-10 DIAGNOSIS — I10 Essential (primary) hypertension: Secondary | ICD-10-CM | POA: Diagnosis not present

## 2023-03-10 DIAGNOSIS — I495 Sick sinus syndrome: Secondary | ICD-10-CM | POA: Diagnosis not present

## 2023-03-10 NOTE — Patient Instructions (Signed)
Medication Instructions:  Your physician recommends that you continue on your current medications as directed. Please refer to the Current Medication list given to you today.  *If you need a refill on your cardiac medications before your next appointment, please call your pharmacy*   Lab Work: Please complete a CBC and BMET in our lab before you leave today. You do not have to be fasting for these labs.  If you have labs (blood work) drawn today and your tests are completely normal, you will receive your results only by: MyChart Message (if you have MyChart) OR A paper copy in the mail If you have any lab test that is abnormal or we need to change your treatment, we will call you to review the results.   Testing/Procedures: None.   Follow-Up: At [facility], you and your health needs are our priority.  As part of our continuing mission to provide you with exceptional heart care, we have created designated Provider Care Teams.  These Care Teams include your primary Cardiologist (physician) and Advanced Practice Providers (APPs -  Physician Assistants and Nurse Practitioners) who all work together to provide you with the care you need, when you need it.  We recommend signing up for the patient portal called "MyChart".  Sign up information is provided on this After Visit Summary.  MyChart is used to connect with patients for Virtual Visits (Telemedicine).  Patients are able to view lab/test results, encounter notes, upcoming appointments, etc.  Non-urgent messages can be sent to your provider as well.   To learn more about what you can do with MyChart, go to [email or web address].    Your next appointment:   1 year(s)  Provider:   Armanda Magic, MD

## 2023-03-10 NOTE — Addendum Note (Signed)
Addended by: Luellen Pucker on: 03/10/2023 08:26 AM   Modules accepted: Orders

## 2023-03-10 NOTE — Progress Notes (Signed)
Cardiology Office Note:    Date:  03/10/2023   ID:  Courtney Dunlap, DOB [birth year], MRN [id number]  PCP:  Adrian Prince, MD  Cardiologist:  Armanda Magic, MD    Referring MD: Adrian Prince, MD   Chief Complaint  Patient presents with   Follow-up    Sleep apnea, SVT, PAF, hypertension    History of Present Illness:    Courtney Dunlap is a 72 y.o. female with a hx of PAF in the setting of hyperthyroidism, SVT s/p ablation, HTN and OSA on CPAP.  She had  recurrent atrial fibrillation with RVR and near syncope and tachybradycardia syndrome with posttermination pauses up to 5 seconds and underwent MDT dual-chamber pacemaker.  Her sotalol was stopped and she was started on flecainide and Cardizem at discharge.  She was seen back by Rudi Coco in the office post implant and was doing well.   She is here today for followup and is doing well.  She denies any chest pain or pressure, SOB, DOE, PND, orthopnea, LE edema, dizziness, palpitations or syncope. She is compliant with her meds and is tolerating meds with no SE.    She is doing well with her PAP device and thinks that she has gotten used to it.  She tolerates the mask and feels the pressure is adequate.  Since going on PAP she feels rested in the am and has no significant daytime sleepiness.  She denies any significant mouth or nasal dryness or nasal congestion.  She does not think that he snores.    Past Medical History:  Diagnosis Date   Allergic dermatitis    Anxiety    Arthritis    back, knees & ankles    Atrial flutter (HCC) 05/25/2016   Benign essential HTN 02/04/2014   Breast cancer (HCC)    Cancer (HCC)    skin- lower abdomen   Depression    Dyslipidemia 08/16/2011   Family history of breast cancer    Genetic testing 05/12/2015   Negative genetic testing on the Breast/Ovarian cancer panel.  The Breast/Ovarian gene panel offered by [facility] includes sequencing and rearrangement analysis for the following 20 genes:  ATM,  BARD1, BRCA1, BRCA2, BRIP1, CDH1, CHEK2, EPCAM, FANCC, MLH1, MSH2, MSH6, NBN, PALB2, PMS2, PTEN, RAD51C, RAD51D, TP53, and XRCC2.   The report date is May 11, 2015.    GERD (gastroesophageal reflux disease)    Hiatal hernia    with GERD   Hypercholesteremia    Hyperthyroidism    pt. reports that she thinks she has hypothyroidism, states MD- [place] took her off med. in the past 1-2 yrs.    Malignant neoplasm of upper-outer quadrant of right breast in female, estrogen receptor positive (HCC) 04/24/2015   Mild cognitive impairment with memory loss    Morbid obesity (HCC)    OSA (obstructive sleep apnea) 02/04/2014   Persistent atrial fibrillation (HCC)    Sleep apnea    severe with AHI 37/hr now on CPAP at 13cm H2O   SVT (supraventricular tachycardia)    likely due to atrial flutter.  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 Penni Bombard   APPENDECTOMY  28 years ago   DILATION AND CURETTAGE OF UTERUS  1996    Polyp resection    ELECTROPHYSIOLOGIC STUDY N/A 05/27/2016   Procedure: EPS/SVT Ablation;  Surgeon: Sharlot Gowda  Myna Hidalgo, MD;  Location: [facility] INVASIVE CV [facility];  Service: Cardiovascular;  Laterality: N/A;   HYSTEROSCOPY  4/12   D&C   PACEMAKER IMPLANT N/A 04/16/2018   Medtronic Azure XT DR MRI SureScan PPM implanted by Dr Johney Frame for afib with post termination pauses/ sick sinus syndrome   RADIOACTIVE SEED GUIDED PARTIAL MASTECTOMY WITH AXILLARY SENTINEL LYMPH NODE BIOPSY Right 05/18/2015   Procedure: RIGHT BREAST RADIOACTIVE SEED PARTIAL MASTECTOMY WITH RIGHT SENTINEL LYMPH NODE MAPPING;  Surgeon: Harriette Bouillon, MD;  Location: [facility] OR;  Service: General;  Laterality: Right;   TONSILLECTOMY     TUBAL LIGATION      Current Medications: Current Meds  Medication Sig   acetaminophen (TYLENOL) 500 MG tablet Take 500 mg by mouth every 6 (six) hours as  needed for pain.   atorvastatin (LIPITOR) 20 MG tablet Take 20 mg by mouth daily after breakfast.    cholecalciferol (VITAMIN D) 1000 units tablet Take 1,000 Units by mouth daily.   diclofenac Sodium (VOLTAREN) 1 % GEL diclofenac 1 % topical gel  APPLY AA QID   diltiazem (CARDIZEM CD) 120 MG 24 hr capsule TAKE 1 CAPSULE(120 MG) BY MOUTH DAILY   flecainide (TAMBOCOR) 100 MG tablet TAKE 1 TABLET(100 MG) BY MOUTH TWICE DAILY   hydrochlorothiazide (HYDRODIURIL) 25 MG tablet Take 1 tablet (25 mg total) by mouth daily. Please call and schedule an appointment for further refills 2nd attempt   losartan (COZAAR) 25 MG tablet Take 25 mg by mouth daily.   Omega-3 Fatty Acids (FISH OIL) 1000 MG CAPS Take 1,000 mg by mouth daily.   triamcinolone cream (KENALOG) 0.1 % as needed for rash.   vitamin B-12 (CYANOCOBALAMIN) 1000 MCG tablet Take 1,000 mcg by mouth daily.   warfarin (COUMADIN) 5 MG tablet Take 1.5 tablets (7.5 mg total) by mouth daily. Resume on 05/30/2016 (Patient taking differently: Take 7.5 mg by mouth daily. Take 1.5 tab m, f,; other days, 1 tab 5mg )     Allergies:   Codeine, Anastrozole, Furosemide, Adhesive [tape], and Latex   Social History   Socioeconomic History   Marital status: Married    Spouse name: Not on file   Number of children: 2   Years of education: Not on file   Highest education level: Not on file  Occupational History    Employer: [employer]  Tobacco Use   Smoking status: Former    Current packs/day: 0.00    Types: Cigarettes    Start date: 05/15/1970    Quit date: 05/15/1974    Years since quitting: 48.8   Smokeless tobacco: Never   Tobacco comments:    quit 35-40 years ago  Vaping Use   Vaping status: Never Used  Substance and Sexual Activity   Alcohol use: Yes    Alcohol/week: 3.0 standard drinks of alcohol    Types: 3 Standard drinks or equivalent per week   Drug use: No   Sexual activity: Not Currently    Birth control/protection:  Post-menopausal  Other Topics Concern   Not on file  Social History Narrative   Not on file   Social Determinants of Health   Financial Resource Strain: Not on file  Food Insecurity: Not on file  Transportation Needs: Not on file  Physical Activity: Not on file  Stress: Not on file  Social Connections: Not on file     Family History: The patient's family history includes Brain cancer in her mother; Breast cancer in her paternal aunt and paternal aunt;  Diabetes in her maternal grandmother; Heart attack in her maternal grandmother; Hypertension in her brother, mother, sister, and sister; Melanoma in her paternal aunt; Multiple myeloma in her paternal aunt; Other (age of onset: 22) in her daughter; Thyroid disease in her brother and sister.  ROS:   Please see the history of present illness.    ROS  All other systems reviewed and negative.   EKGs/Labs/Other Studies Reviewed:    The following studies were reviewed today: PAP compliance download, outside labs on [facility]  Recent Labs: No results found for requested labs within last 365 days.   Recent Lipid Panel No results found for: "CHOL", "TRIG", "HDL", "CHOLHDL", "VLDL", "LDLCALC", "LDLDIRECT"  Physical Exam:    VS:  BP 122/70 (BP Location: Left Arm, Patient Position: Sitting, Cuff Size: Large)   Pulse 82   Ht 5\' 4"  (1.626 m)   Wt 189 lb 6.4 oz (85.9 kg)   LMP 11/21/2010 Comment: spotting-had hysteroscopy/d&c  SpO2 98%   BMI 32.51 kg/m     Wt Readings from Last 3 Encounters:  03/10/23 189 lb 6.4 oz (85.9 kg)  09/03/22 205 lb (93 kg)  02/05/22 201 lb (91.2 kg)     ASSESSMENT:    1. OSA (obstructive sleep apnea)   2. Benign essential HTN   3. SVT (supraventricular tachycardia)   4. Tachycardia-bradycardia syndrome (HCC)     PLAN:    In order of problems listed above:  OSA - The patient is tolerating PAP therapy well without any 0.5problems. The PAP download performed by his DME was personally reviewed and  interpreted by me today and showed an AHI of auto  PAP from 0.5/hr on auto CPAP from 4 to 18 cm H2O with 50% compliance in using more than 4 hours nightly.  The patient has been using and benefiting from PAP use and will continue to benefit from therapy.  -I encouraged her to be more compliant with her device   HTN -BP is at controlled on exam today -Continue prescription drug management with Cardizem CD 120 mg daily, Losartan 25mg  daily, HCTZ 25 mg daily with as needed refill -check BMET  PAF/SVT -s/p SVT ablation remotely -Maintaining normal sinus rhythm and denies any palpitations -Continue prescription drug management with Cardizem CD 120 mg daily, flecainide 100 mg twice daily and warfarin with as needed refills -Check CBC  Tachy/Brady syndrome -s/p PPM -followed in device clinic   Medication Adjustments/Labs and Tests Ordered: Current medicines are reviewed at length with the patient today.  Concerns regarding medicines are outlined above.  No orders of the defined types were placed in this encounter.  No orders of the defined types were placed in this encounter.   Signed, Armanda Magic, MD  03/10/2023 8:21 AM    [facility]

## 2023-03-11 ENCOUNTER — Telehealth: Payer: Self-pay

## 2023-03-11 NOTE — Telephone Encounter (Signed)
Called to discuss normal lab results, no answer. Left detailed message per DPR explaining normal results and to continue current medications. Advised patient to call our office if she has any questions.

## 2023-03-11 NOTE — Telephone Encounter (Signed)
-----   Message from Armanda Magic sent at 03/11/2023  8:10 AM EDT ----- Please let patient know that labs were normal.  Continue current medical therapy.

## 2023-04-09 DIAGNOSIS — Z7901 Long term (current) use of anticoagulants: Secondary | ICD-10-CM | POA: Diagnosis not present

## 2023-04-09 DIAGNOSIS — I480 Paroxysmal atrial fibrillation: Secondary | ICD-10-CM | POA: Diagnosis not present

## 2023-04-29 ENCOUNTER — Ambulatory Visit (INDEPENDENT_AMBULATORY_CARE_PROVIDER_SITE_OTHER): Payer: Medicare PPO

## 2023-04-29 DIAGNOSIS — I495 Sick sinus syndrome: Secondary | ICD-10-CM | POA: Diagnosis not present

## 2023-04-29 LAB — CUP PACEART REMOTE DEVICE CHECK
Battery Remaining Longevity: 113 mo
Battery Voltage: 3 V
Brady Statistic AP VP Percent: 0 %
Brady Statistic AP VS Percent: 77.78 %
Brady Statistic AS VP Percent: 0 %
Brady Statistic AS VS Percent: 22.22 %
Brady Statistic RA Percent Paced: 77.79 %
Brady Statistic RV Percent Paced: 0 %
Date Time Interrogation Session: 20240916235733
Implantable Lead Connection Status: 753985
Implantable Lead Connection Status: 753985
Implantable Lead Implant Date: 20190905
Implantable Lead Implant Date: 20190905
Implantable Lead Location: 753859
Implantable Lead Location: 753860
Implantable Lead Model: 3830
Implantable Lead Model: 5076
Implantable Pulse Generator Implant Date: 20190905
Lead Channel Impedance Value: 285 Ohm
Lead Channel Impedance Value: 342 Ohm
Lead Channel Impedance Value: 399 Ohm
Lead Channel Impedance Value: 418 Ohm
Lead Channel Pacing Threshold Amplitude: 0.875 V
Lead Channel Pacing Threshold Pulse Width: 0.4 ms
Lead Channel Sensing Intrinsic Amplitude: 0.75 mV
Lead Channel Sensing Intrinsic Amplitude: 0.75 mV
Lead Channel Sensing Intrinsic Amplitude: 5.875 mV
Lead Channel Sensing Intrinsic Amplitude: 5.875 mV
Lead Channel Setting Pacing Amplitude: 1.75 V
Lead Channel Setting Pacing Amplitude: 2.5 V
Lead Channel Setting Pacing Pulse Width: 0.4 ms
Lead Channel Setting Sensing Sensitivity: 0.9 mV
Zone Setting Status: 755011
Zone Setting Status: 755011

## 2023-05-06 DIAGNOSIS — Z7901 Long term (current) use of anticoagulants: Secondary | ICD-10-CM | POA: Diagnosis not present

## 2023-05-06 DIAGNOSIS — I480 Paroxysmal atrial fibrillation: Secondary | ICD-10-CM | POA: Diagnosis not present

## 2023-05-14 NOTE — Progress Notes (Signed)
Remote pacemaker transmission.   

## 2023-05-27 DIAGNOSIS — C50911 Malignant neoplasm of unspecified site of right female breast: Secondary | ICD-10-CM | POA: Diagnosis not present

## 2023-05-30 DIAGNOSIS — C50911 Malignant neoplasm of unspecified site of right female breast: Secondary | ICD-10-CM | POA: Diagnosis not present

## 2023-06-05 DIAGNOSIS — Z7901 Long term (current) use of anticoagulants: Secondary | ICD-10-CM | POA: Diagnosis not present

## 2023-06-05 DIAGNOSIS — I480 Paroxysmal atrial fibrillation: Secondary | ICD-10-CM | POA: Diagnosis not present

## 2023-06-26 DIAGNOSIS — Z23 Encounter for immunization: Secondary | ICD-10-CM | POA: Diagnosis not present

## 2023-07-08 DIAGNOSIS — I480 Paroxysmal atrial fibrillation: Secondary | ICD-10-CM | POA: Diagnosis not present

## 2023-07-08 DIAGNOSIS — Z7901 Long term (current) use of anticoagulants: Secondary | ICD-10-CM | POA: Diagnosis not present

## 2023-07-10 ENCOUNTER — Other Ambulatory Visit: Payer: Self-pay | Admitting: Cardiology

## 2023-07-10 DIAGNOSIS — I480 Paroxysmal atrial fibrillation: Secondary | ICD-10-CM

## 2023-07-29 ENCOUNTER — Ambulatory Visit (INDEPENDENT_AMBULATORY_CARE_PROVIDER_SITE_OTHER): Payer: Medicare PPO

## 2023-07-29 DIAGNOSIS — I495 Sick sinus syndrome: Secondary | ICD-10-CM

## 2023-07-30 LAB — CUP PACEART REMOTE DEVICE CHECK
Battery Remaining Longevity: 110 mo
Battery Voltage: 3 V
Brady Statistic AP VP Percent: 0.01 %
Brady Statistic AP VS Percent: 71.31 %
Brady Statistic AS VP Percent: 0 %
Brady Statistic AS VS Percent: 28.68 %
Brady Statistic RA Percent Paced: 71.3 %
Brady Statistic RV Percent Paced: 0.01 %
Date Time Interrogation Session: 20241217022202
Implantable Lead Connection Status: 753985
Implantable Lead Connection Status: 753985
Implantable Lead Implant Date: 20190905
Implantable Lead Implant Date: 20190905
Implantable Lead Location: 753859
Implantable Lead Location: 753860
Implantable Lead Model: 3830
Implantable Lead Model: 5076
Implantable Pulse Generator Implant Date: 20190905
Lead Channel Impedance Value: 285 Ohm
Lead Channel Impedance Value: 323 Ohm
Lead Channel Impedance Value: 399 Ohm
Lead Channel Impedance Value: 399 Ohm
Lead Channel Pacing Threshold Amplitude: 0.875 V
Lead Channel Pacing Threshold Pulse Width: 0.4 ms
Lead Channel Sensing Intrinsic Amplitude: 2.5 mV
Lead Channel Sensing Intrinsic Amplitude: 2.5 mV
Lead Channel Sensing Intrinsic Amplitude: 6 mV
Lead Channel Sensing Intrinsic Amplitude: 6 mV
Lead Channel Setting Pacing Amplitude: 1.75 V
Lead Channel Setting Pacing Amplitude: 2.5 V
Lead Channel Setting Pacing Pulse Width: 0.4 ms
Lead Channel Setting Sensing Sensitivity: 0.9 mV
Zone Setting Status: 755011
Zone Setting Status: 755011

## 2023-07-31 DIAGNOSIS — I480 Paroxysmal atrial fibrillation: Secondary | ICD-10-CM | POA: Diagnosis not present

## 2023-07-31 DIAGNOSIS — Z7901 Long term (current) use of anticoagulants: Secondary | ICD-10-CM | POA: Diagnosis not present

## 2023-08-11 ENCOUNTER — Other Ambulatory Visit: Payer: Self-pay | Admitting: Cardiology

## 2023-08-11 DIAGNOSIS — I480 Paroxysmal atrial fibrillation: Secondary | ICD-10-CM

## 2023-08-14 DIAGNOSIS — Z1231 Encounter for screening mammogram for malignant neoplasm of breast: Secondary | ICD-10-CM | POA: Diagnosis not present

## 2023-08-18 ENCOUNTER — Encounter (HOSPITAL_BASED_OUTPATIENT_CLINIC_OR_DEPARTMENT_OTHER): Payer: Self-pay | Admitting: *Deleted

## 2023-08-28 DIAGNOSIS — R197 Diarrhea, unspecified: Secondary | ICD-10-CM | POA: Diagnosis not present

## 2023-08-28 DIAGNOSIS — Z1152 Encounter for screening for COVID-19: Secondary | ICD-10-CM | POA: Diagnosis not present

## 2023-08-28 DIAGNOSIS — R200 Anesthesia of skin: Secondary | ICD-10-CM | POA: Diagnosis not present

## 2023-08-28 DIAGNOSIS — J029 Acute pharyngitis, unspecified: Secondary | ICD-10-CM | POA: Diagnosis not present

## 2023-08-28 DIAGNOSIS — J069 Acute upper respiratory infection, unspecified: Secondary | ICD-10-CM | POA: Diagnosis not present

## 2023-08-28 DIAGNOSIS — R058 Other specified cough: Secondary | ICD-10-CM | POA: Diagnosis not present

## 2023-08-28 DIAGNOSIS — R0981 Nasal congestion: Secondary | ICD-10-CM | POA: Diagnosis not present

## 2023-09-02 DIAGNOSIS — H52223 Regular astigmatism, bilateral: Secondary | ICD-10-CM | POA: Diagnosis not present

## 2023-09-02 DIAGNOSIS — H5201 Hypermetropia, right eye: Secondary | ICD-10-CM | POA: Diagnosis not present

## 2023-09-02 DIAGNOSIS — I10 Essential (primary) hypertension: Secondary | ICD-10-CM | POA: Diagnosis not present

## 2023-09-02 DIAGNOSIS — H35033 Hypertensive retinopathy, bilateral: Secondary | ICD-10-CM | POA: Diagnosis not present

## 2023-09-02 DIAGNOSIS — H524 Presbyopia: Secondary | ICD-10-CM | POA: Diagnosis not present

## 2023-09-02 DIAGNOSIS — H5212 Myopia, left eye: Secondary | ICD-10-CM | POA: Diagnosis not present

## 2023-09-02 DIAGNOSIS — H2513 Age-related nuclear cataract, bilateral: Secondary | ICD-10-CM | POA: Diagnosis not present

## 2023-09-02 DIAGNOSIS — H53143 Visual discomfort, bilateral: Secondary | ICD-10-CM | POA: Diagnosis not present

## 2023-09-04 DIAGNOSIS — R7302 Impaired glucose tolerance (oral): Secondary | ICD-10-CM | POA: Diagnosis not present

## 2023-09-04 DIAGNOSIS — I10 Essential (primary) hypertension: Secondary | ICD-10-CM | POA: Diagnosis not present

## 2023-09-04 DIAGNOSIS — E785 Hyperlipidemia, unspecified: Secondary | ICD-10-CM | POA: Diagnosis not present

## 2023-09-04 DIAGNOSIS — Z7901 Long term (current) use of anticoagulants: Secondary | ICD-10-CM | POA: Diagnosis not present

## 2023-09-04 DIAGNOSIS — R413 Other amnesia: Secondary | ICD-10-CM | POA: Diagnosis not present

## 2023-09-04 DIAGNOSIS — E0500 Thyrotoxicosis with diffuse goiter without thyrotoxic crisis or storm: Secondary | ICD-10-CM | POA: Diagnosis not present

## 2023-09-04 DIAGNOSIS — I480 Paroxysmal atrial fibrillation: Secondary | ICD-10-CM | POA: Diagnosis not present

## 2023-09-05 NOTE — Addendum Note (Signed)
Addended by: Geralyn Flash D on: 09/05/2023 10:18 AM   Modules accepted: Orders

## 2023-09-05 NOTE — Progress Notes (Signed)
Remote pacemaker transmission.

## 2023-09-23 ENCOUNTER — Other Ambulatory Visit: Payer: Self-pay | Admitting: Cardiology

## 2023-09-23 DIAGNOSIS — I480 Paroxysmal atrial fibrillation: Secondary | ICD-10-CM

## 2023-10-08 DIAGNOSIS — I480 Paroxysmal atrial fibrillation: Secondary | ICD-10-CM | POA: Diagnosis not present

## 2023-10-08 DIAGNOSIS — S41102A Unspecified open wound of left upper arm, initial encounter: Secondary | ICD-10-CM | POA: Diagnosis not present

## 2023-10-08 DIAGNOSIS — Z7901 Long term (current) use of anticoagulants: Secondary | ICD-10-CM | POA: Diagnosis not present

## 2023-10-28 ENCOUNTER — Ambulatory Visit (INDEPENDENT_AMBULATORY_CARE_PROVIDER_SITE_OTHER): Payer: Medicare PPO

## 2023-10-28 DIAGNOSIS — I495 Sick sinus syndrome: Secondary | ICD-10-CM

## 2023-10-29 LAB — CUP PACEART REMOTE DEVICE CHECK
Battery Remaining Longevity: 108 mo
Battery Voltage: 3 V
Brady Statistic AP VP Percent: 0 %
Brady Statistic AP VS Percent: 74.39 %
Brady Statistic AS VP Percent: 0 %
Brady Statistic AS VS Percent: 25.61 %
Brady Statistic RA Percent Paced: 74.36 %
Brady Statistic RV Percent Paced: 0 %
Date Time Interrogation Session: 20250318054835
Implantable Lead Connection Status: 753985
Implantable Lead Connection Status: 753985
Implantable Lead Implant Date: 20190905
Implantable Lead Implant Date: 20190905
Implantable Lead Location: 753859
Implantable Lead Location: 753860
Implantable Lead Model: 3830
Implantable Lead Model: 5076
Implantable Pulse Generator Implant Date: 20190905
Lead Channel Impedance Value: 285 Ohm
Lead Channel Impedance Value: 323 Ohm
Lead Channel Impedance Value: 399 Ohm
Lead Channel Impedance Value: 399 Ohm
Lead Channel Pacing Threshold Amplitude: 0.875 V
Lead Channel Pacing Threshold Pulse Width: 0.4 ms
Lead Channel Sensing Intrinsic Amplitude: 2.625 mV
Lead Channel Sensing Intrinsic Amplitude: 2.625 mV
Lead Channel Sensing Intrinsic Amplitude: 5.75 mV
Lead Channel Sensing Intrinsic Amplitude: 5.75 mV
Lead Channel Setting Pacing Amplitude: 1.75 V
Lead Channel Setting Pacing Amplitude: 2.5 V
Lead Channel Setting Pacing Pulse Width: 0.4 ms
Lead Channel Setting Sensing Sensitivity: 0.9 mV
Zone Setting Status: 755011
Zone Setting Status: 755011

## 2023-11-05 DIAGNOSIS — I480 Paroxysmal atrial fibrillation: Secondary | ICD-10-CM | POA: Diagnosis not present

## 2023-11-05 DIAGNOSIS — Z7901 Long term (current) use of anticoagulants: Secondary | ICD-10-CM | POA: Diagnosis not present

## 2023-11-09 ENCOUNTER — Other Ambulatory Visit: Payer: Self-pay | Admitting: Student

## 2023-11-09 DIAGNOSIS — I480 Paroxysmal atrial fibrillation: Secondary | ICD-10-CM

## 2023-12-04 DIAGNOSIS — Z7901 Long term (current) use of anticoagulants: Secondary | ICD-10-CM | POA: Diagnosis not present

## 2023-12-04 DIAGNOSIS — I480 Paroxysmal atrial fibrillation: Secondary | ICD-10-CM | POA: Diagnosis not present

## 2023-12-15 NOTE — Addendum Note (Signed)
 Addended by: Lott Rouleau A on: 12/15/2023 08:38 AM   Modules accepted: Orders

## 2023-12-15 NOTE — Progress Notes (Signed)
 Remote pacemaker transmission.

## 2023-12-16 DIAGNOSIS — H2512 Age-related nuclear cataract, left eye: Secondary | ICD-10-CM | POA: Diagnosis not present

## 2023-12-16 DIAGNOSIS — H2513 Age-related nuclear cataract, bilateral: Secondary | ICD-10-CM | POA: Diagnosis not present

## 2024-01-01 DIAGNOSIS — I480 Paroxysmal atrial fibrillation: Secondary | ICD-10-CM | POA: Diagnosis not present

## 2024-01-01 DIAGNOSIS — Z7901 Long term (current) use of anticoagulants: Secondary | ICD-10-CM | POA: Diagnosis not present

## 2024-01-19 DIAGNOSIS — M79671 Pain in right foot: Secondary | ICD-10-CM | POA: Diagnosis not present

## 2024-01-19 DIAGNOSIS — M79602 Pain in left arm: Secondary | ICD-10-CM | POA: Diagnosis not present

## 2024-01-19 DIAGNOSIS — Z7901 Long term (current) use of anticoagulants: Secondary | ICD-10-CM | POA: Diagnosis not present

## 2024-01-19 DIAGNOSIS — S60222A Contusion of left hand, initial encounter: Secondary | ICD-10-CM | POA: Diagnosis not present

## 2024-01-19 DIAGNOSIS — I480 Paroxysmal atrial fibrillation: Secondary | ICD-10-CM | POA: Diagnosis not present

## 2024-01-19 DIAGNOSIS — W010XXA Fall on same level from slipping, tripping and stumbling without subsequent striking against object, initial encounter: Secondary | ICD-10-CM | POA: Diagnosis not present

## 2024-01-19 DIAGNOSIS — M25512 Pain in left shoulder: Secondary | ICD-10-CM | POA: Diagnosis not present

## 2024-01-19 DIAGNOSIS — M79642 Pain in left hand: Secondary | ICD-10-CM | POA: Diagnosis not present

## 2024-01-22 NOTE — Progress Notes (Signed)
  Electrophysiology Office Follow up Visit Note:    Date:  01/23/2024   ID:  Courtney Dunlap, DOB [birth year], MRN [id number]  PCP:  Rosslyn Coons, MD  [facility] Cardiologist:  Gaylyn Keas, MD  [facility] Electrophysiologist:  Boyce Byes, MD    Interval History:     Courtney Dunlap is a 73 y.o. female who presents for a follow up visit.   The patient last saw Courtney Dunlap September 03, 2022.  She has a history of persistent atrial fibrillation, tacky bradycardia post permanent pacemaker implant, SVT with a prior catheter ablation, sleep apnea.  Her pacemaker was implanted in September 2019 for symptomatic bradycardia.  For her history of SVT, she was previously on sotalol  and flecainide  and ultimately underwent catheter ablation in 2017.  She is well.  She fell on Saturday night while stepping off a deck.  She landed on her left side near her pacemaker.  Thankfully she did not fracture anything.      Past medical, surgical, social and family history were reviewed.  ROS:   Please see the history of present illness.    All other systems reviewed and are negative.  EKGs/Labs/Other Studies Reviewed:    The following studies were reviewed today:  January 23, 2024 in clinic device interrogation personally reviewed Battery and lead parameter stable. No changes in impedance to suggest lead issue after recent fall No programming changes made today    EKG Interpretation Date/Time:  Friday January 23 2024 12:18:19 EDT Ventricular Rate:  70 PR Interval:  170 QRS Duration:  174 QT Interval:  460 QTC Calculation: 496 R Axis:   264  Text Interpretation: Normal sinus rhythm Right bundle branch block Confirmed by Harvie Liner [phone]) on 01/23/2024 12:33:57 PM    Physical Exam:    VS:  BP (!) 140/76 (BP Location: Left Arm, Patient Position: Sitting, Cuff Size: Normal)   Pulse 70   Ht 5' 4 (1.626 m)   Wt 170 lb (77.1 kg)   LMP 11/21/2010 Comment: spotting-had  hysteroscopy/d&c  SpO2 95%   BMI 29.18 kg/m     Wt Readings from Last 3 Encounters:  01/23/24 170 lb (77.1 kg)  03/10/23 189 lb 6.4 oz (85.9 kg)  09/03/22 205 lb (93 kg)     GEN: no distress CARD: RRR, No MRG.  CIED pocket well-healed RESP: No IWOB. CTAB.      ASSESSMENT:    1. Persistent atrial fibrillation (HCC)   2. Pacemaker, Medtronic 2019   3. Tachycardia-bradycardia syndrome (HCC)   4. SVT (supraventricular tachycardia) (HCC)   5. Encounter for long-term (current) use of high-risk medication    PLAN:    In order of problems listed above:  #Symptomatic bradycardia #Permanent pacemaker in situ Device functioning appropriately.  Continue remote monitoring. Transfer remotes  #Atrial fibrillation #High risk drug monitoring-flecainide  Continue warfarin for stroke prophylaxis Continue diltiazem  and flecainide  As needed QRS durations acceptable for ongoing flecainide  use  follow-up 1 year with EP APP.  Signed, Harvie Liner, MD, [facility], [facility] 01/23/2024 12:38 PM    Electrophysiology [facility]

## 2024-01-23 ENCOUNTER — Encounter: Payer: Self-pay | Admitting: Cardiology

## 2024-01-23 ENCOUNTER — Ambulatory Visit: Attending: Cardiology | Admitting: Cardiology

## 2024-01-23 VITALS — BP 140/76 | HR 70 | Ht 64.0 in | Wt 170.0 lb

## 2024-01-23 DIAGNOSIS — I4819 Other persistent atrial fibrillation: Secondary | ICD-10-CM | POA: Diagnosis not present

## 2024-01-23 DIAGNOSIS — I495 Sick sinus syndrome: Secondary | ICD-10-CM | POA: Diagnosis not present

## 2024-01-23 DIAGNOSIS — Z79899 Other long term (current) drug therapy: Secondary | ICD-10-CM

## 2024-01-23 DIAGNOSIS — Z950 Presence of cardiac pacemaker: Secondary | ICD-10-CM

## 2024-01-23 DIAGNOSIS — I471 Supraventricular tachycardia, unspecified: Secondary | ICD-10-CM | POA: Diagnosis not present

## 2024-01-23 NOTE — Patient Instructions (Signed)

## 2024-01-27 ENCOUNTER — Ambulatory Visit: Payer: Self-pay | Admitting: Cardiovascular Disease

## 2024-01-27 ENCOUNTER — Ambulatory Visit (INDEPENDENT_AMBULATORY_CARE_PROVIDER_SITE_OTHER): Payer: Medicare PPO

## 2024-01-27 DIAGNOSIS — I495 Sick sinus syndrome: Secondary | ICD-10-CM

## 2024-01-27 DIAGNOSIS — Z7901 Long term (current) use of anticoagulants: Secondary | ICD-10-CM | POA: Diagnosis not present

## 2024-01-27 DIAGNOSIS — I480 Paroxysmal atrial fibrillation: Secondary | ICD-10-CM | POA: Diagnosis not present

## 2024-01-27 LAB — CUP PACEART REMOTE DEVICE CHECK
Battery Remaining Longevity: 100 mo
Battery Voltage: 2.99 V
Brady Statistic AP VP Percent: 0 %
Brady Statistic AP VS Percent: 76.12 %
Brady Statistic AS VP Percent: 0 %
Brady Statistic AS VS Percent: 23.87 %
Brady Statistic RA Percent Paced: 76.06 %
Brady Statistic RV Percent Paced: 0 %
Date Time Interrogation Session: 20250617000926
Implantable Lead Connection Status: 753985
Implantable Lead Connection Status: 753985
Implantable Lead Implant Date: 20190905
Implantable Lead Implant Date: 20190905
Implantable Lead Location: 753859
Implantable Lead Location: 753860
Implantable Lead Model: 3830
Implantable Lead Model: 5076
Implantable Pulse Generator Implant Date: 20190905
Lead Channel Impedance Value: 304 Ohm
Lead Channel Impedance Value: 361 Ohm
Lead Channel Impedance Value: 418 Ohm
Lead Channel Impedance Value: 456 Ohm
Lead Channel Pacing Threshold Amplitude: 1 V
Lead Channel Pacing Threshold Pulse Width: 0.4 ms
Lead Channel Sensing Intrinsic Amplitude: 2.875 mV
Lead Channel Sensing Intrinsic Amplitude: 2.875 mV
Lead Channel Sensing Intrinsic Amplitude: 6.25 mV
Lead Channel Sensing Intrinsic Amplitude: 6.25 mV
Lead Channel Setting Pacing Amplitude: 2.25 V
Lead Channel Setting Pacing Amplitude: 2.75 V
Lead Channel Setting Pacing Pulse Width: 0.4 ms
Lead Channel Setting Sensing Sensitivity: 0.9 mV
Zone Setting Status: 755011
Zone Setting Status: 755011

## 2024-01-29 DIAGNOSIS — H2512 Age-related nuclear cataract, left eye: Secondary | ICD-10-CM | POA: Diagnosis not present

## 2024-01-30 DIAGNOSIS — H2512 Age-related nuclear cataract, left eye: Secondary | ICD-10-CM | POA: Diagnosis not present

## 2024-01-30 DIAGNOSIS — H2511 Age-related nuclear cataract, right eye: Secondary | ICD-10-CM | POA: Diagnosis not present

## 2024-02-17 DIAGNOSIS — I10 Essential (primary) hypertension: Secondary | ICD-10-CM | POA: Diagnosis not present

## 2024-02-17 DIAGNOSIS — E785 Hyperlipidemia, unspecified: Secondary | ICD-10-CM | POA: Diagnosis not present

## 2024-02-17 DIAGNOSIS — Z1212 Encounter for screening for malignant neoplasm of rectum: Secondary | ICD-10-CM | POA: Diagnosis not present

## 2024-02-17 DIAGNOSIS — E559 Vitamin D deficiency, unspecified: Secondary | ICD-10-CM | POA: Diagnosis not present

## 2024-02-17 DIAGNOSIS — R7302 Impaired glucose tolerance (oral): Secondary | ICD-10-CM | POA: Diagnosis not present

## 2024-02-17 DIAGNOSIS — E0500 Thyrotoxicosis with diffuse goiter without thyrotoxic crisis or storm: Secondary | ICD-10-CM | POA: Diagnosis not present

## 2024-02-17 DIAGNOSIS — Z7901 Long term (current) use of anticoagulants: Secondary | ICD-10-CM | POA: Diagnosis not present

## 2024-02-18 DIAGNOSIS — H2511 Age-related nuclear cataract, right eye: Secondary | ICD-10-CM | POA: Diagnosis not present

## 2024-02-24 DIAGNOSIS — Z853 Personal history of malignant neoplasm of breast: Secondary | ICD-10-CM | POA: Diagnosis not present

## 2024-02-24 DIAGNOSIS — Z1331 Encounter for screening for depression: Secondary | ICD-10-CM | POA: Diagnosis not present

## 2024-02-24 DIAGNOSIS — R413 Other amnesia: Secondary | ICD-10-CM | POA: Diagnosis not present

## 2024-02-24 DIAGNOSIS — Z0000 Encounter for general adult medical examination without abnormal findings: Secondary | ICD-10-CM | POA: Diagnosis not present

## 2024-02-24 DIAGNOSIS — Z1212 Encounter for screening for malignant neoplasm of rectum: Secondary | ICD-10-CM | POA: Diagnosis not present

## 2024-02-24 DIAGNOSIS — I700 Atherosclerosis of aorta: Secondary | ICD-10-CM | POA: Diagnosis not present

## 2024-02-24 DIAGNOSIS — E0500 Thyrotoxicosis with diffuse goiter without thyrotoxic crisis or storm: Secondary | ICD-10-CM | POA: Diagnosis not present

## 2024-02-24 DIAGNOSIS — I480 Paroxysmal atrial fibrillation: Secondary | ICD-10-CM | POA: Diagnosis not present

## 2024-02-24 DIAGNOSIS — Z1339 Encounter for screening examination for other mental health and behavioral disorders: Secondary | ICD-10-CM | POA: Diagnosis not present

## 2024-02-24 DIAGNOSIS — G4730 Sleep apnea, unspecified: Secondary | ICD-10-CM | POA: Diagnosis not present

## 2024-02-24 DIAGNOSIS — R82998 Other abnormal findings in urine: Secondary | ICD-10-CM | POA: Diagnosis not present

## 2024-02-24 DIAGNOSIS — I10 Essential (primary) hypertension: Secondary | ICD-10-CM | POA: Diagnosis not present

## 2024-02-24 DIAGNOSIS — Z7901 Long term (current) use of anticoagulants: Secondary | ICD-10-CM | POA: Diagnosis not present

## 2024-02-24 LAB — LAB REPORT - SCANNED
Albumin, Urine POC: <3
Creatinine, POC: 37.4 mg/dL
Microalb Creat Ratio: <8

## 2024-03-11 ENCOUNTER — Encounter: Payer: Self-pay | Admitting: Cardiology

## 2024-03-11 ENCOUNTER — Ambulatory Visit: Attending: Cardiology | Admitting: Cardiology

## 2024-03-11 VITALS — BP 138/70 | HR 79 | Ht 64.0 in | Wt 175.4 lb

## 2024-03-11 DIAGNOSIS — R079 Chest pain, unspecified: Secondary | ICD-10-CM | POA: Diagnosis not present

## 2024-03-11 DIAGNOSIS — G4733 Obstructive sleep apnea (adult) (pediatric): Secondary | ICD-10-CM

## 2024-03-11 DIAGNOSIS — I471 Supraventricular tachycardia, unspecified: Secondary | ICD-10-CM | POA: Diagnosis not present

## 2024-03-11 DIAGNOSIS — R011 Cardiac murmur, unspecified: Secondary | ICD-10-CM

## 2024-03-11 DIAGNOSIS — I4819 Other persistent atrial fibrillation: Secondary | ICD-10-CM | POA: Diagnosis not present

## 2024-03-11 DIAGNOSIS — I10 Essential (primary) hypertension: Secondary | ICD-10-CM | POA: Diagnosis not present

## 2024-03-11 DIAGNOSIS — I495 Sick sinus syndrome: Secondary | ICD-10-CM

## 2024-03-11 NOTE — Addendum Note (Signed)
 Addended by: JANIT GENI CROME on: 03/11/2024 09:45 AM   Modules accepted: Orders

## 2024-03-11 NOTE — Progress Notes (Signed)
 Cardiology Office Note:    Date:  03/11/2024   ID:  Livia C Dunlap, DOB 22-Jul-1951, MRN [id number]  PCP:  Nichole Senior, MD  Cardiologist:  Wilbert Bihari, MD    Referring MD: Nichole Senior, MD   Chief Complaint  Patient presents with   Sleep Apnea   Hypertension   Atrial Fibrillation    History of Present Illness:    Courtney Dunlap is a 73 y.o. female with a hx of PAF in the setting of hyperthyroidism, SVT s/p ablation, HTN and OSA on CPAP.  She had  recurrent atrial fibrillation with RVR and near syncope and tachybradycardia syndrome with posttermination pauses up to 5 seconds and underwent MDT dual-chamber pacemaker.  Her sotalol  was stopped and she was started on flecainide  and Cardizem  at discharge.   She is here today and is doing well.  She has a sharp pain from time to time that lasts a second in her left breast from time to time.  She denies any anginal chest pain or pressure, shortness of breath, DOE, PND, orthopnea, lower extremity edema, dizziness, palpitations or syncope.  She is doing well with her PAP device and thinks that she has gotten used to it.  She tolerates the mask and feels the pressure is adequate.  Since going on PAP she feels rested in the am and has no significant daytime sleepiness.  She denies any significant mouth or nasal dryness or nasal congestion.  She does not think that she snores. An Epworth Sleepiness Scale score was calculated the office today and this endorsed at 6 arguing against residual daytime sleepiness. Patient denies any episodes of bruxism, restless legs, No gagging hallucinations or cataplectic events.     Past Medical History:  Diagnosis Date   Allergic dermatitis    Anxiety    Arthritis    back, knees & ankles    Atrial flutter (HCC) 05/25/2016   Benign essential HTN 02/04/2014   Breast cancer (HCC)    Cancer (HCC)    skin- lower abdomen   Depression    Dyslipidemia 08/16/2011   Family history of breast cancer    Genetic  testing 05/12/2015   Negative genetic testing on the Breast/Ovarian cancer panel.  The Breast/Ovarian gene panel offered by [facility] includes sequencing and rearrangement analysis for the following 20 genes:  ATM, BARD1, BRCA1, BRCA2, BRIP1, CDH1, CHEK2, EPCAM, FANCC, MLH1, MSH2, MSH6, NBN, PALB2, PMS2, PTEN, RAD51C, RAD51D, TP53, and XRCC2.   The report date is May 11, 2015.    GERD (gastroesophageal reflux disease)    Hiatal hernia    with GERD   Hypercholesteremia    Hyperthyroidism    pt. reports that she thinks she has hypothyroidism, states MD- [facility] took her off med. in the past 1-2 yrs.    Malignant neoplasm of upper-outer quadrant of right breast in female, estrogen receptor positive (HCC) 04/24/2015   Mild cognitive impairment with memory loss    Morbid obesity (HCC)    OSA (obstructive sleep apnea) 02/04/2014   Persistent atrial fibrillation (HCC)    Sleep apnea    severe with AHI 37/hr now on CPAP at 13cm H2O   SVT (supraventricular tachycardia) (HCC)    likely due to atrial flutter.  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 Arnaldo   APPENDECTOMY  28 years ago  DILATION AND CURETTAGE OF UTERUS  1996    Polyp resection    ELECTROPHYSIOLOGIC STUDY N/A 05/27/2016   Procedure: EPS/SVT Ablation;  Surgeon: Danelle LELON Birmingham, MD;  Location: [facility] IN[facility]IVE [facility];  Service: Cardiovascular;  Laterality: N/A;   HYSTEROSCOPY  4/12   D&C   PACEMAKER IMPLANT N/A 04/16/2018   Medtronic Azure XT DR MRI SureScan PPM implanted by Dr Kelsie for afib with post termination pauses/ sick sinus syndrome   RADIOACTIVE SEED GUIDED PARTIAL MASTECTOMY WITH AXILLARY SENTINEL LYMPH NODE BIOPSY Right 05/18/2015   Procedure: RIGHT BREAST RADIOACTIVE SEED PARTIAL MASTECTOMY WITH RIGHT SENTINEL LYMPH NODE MAPPING;  Surgeon: Debby Shipper, MD;  Location: [facility] OR;   Service: General;  Laterality: Right;   TONSILLECTOMY     TUBAL LIGATION      Current Medications: Current Meds  Medication Sig   acetaminophen  (TYLENOL ) 500 MG tablet Take 500 mg by mouth every 6 (six) hours as needed for pain.   atorvastatin  (LIPITOR) 20 MG tablet Take 20 mg by mouth daily after breakfast.    cholecalciferol  (VITAMIN D ) 1000 units tablet Take 1,000 Units by mouth daily.   diclofenac Sodium (VOLTAREN) 1 % GEL diclofenac 1 % topical gel  APPLY AA QID   diltiazem  (CARDIZEM  CD) 120 MG 24 hr capsule Take 1 capsule (120 mg total) by mouth daily.   flecainide  (TAMBOCOR ) 100 MG tablet TAKE 1 TABLET(100 MG) BY MOUTH TWICE DAILY   hydrochlorothiazide  (HYDRODIURIL ) 25 MG tablet Take 1 tablet (25 mg total) by mouth daily. Please call and schedule an appointment for further refills 2nd attempt   losartan (COZAAR) 25 MG tablet Take 25 mg by mouth daily.   Omega-3 Fatty Acids (FISH OIL) 1000 MG CAPS Take 1,000 mg by mouth daily.   triamcinolone  cream (KENALOG) 0.1 % as needed for rash.   vitamin B-12 (CYANOCOBALAMIN ) 1000 MCG tablet Take 1,000 mcg by mouth daily.   warfarin (COUMADIN ) 5 MG tablet Take 1.5 tablets (7.5 mg total) by mouth daily. Resume on 05/30/2016 (Patient taking differently: Take 7.5 mg by mouth daily. Take 1.5 tab m, f,; other days, 1 tab 5mg )     Allergies:   Codeine, Anastrozole , Furosemide, Adhesive [tape], and Latex   Social History   Socioeconomic History   Marital status: Married    Spouse name: Not on file   Number of children: 2   Years of education: Not on file   Highest education level: Not on file  Occupational History    Employer: [employer]  Tobacco Use   Smoking status: Former    Current packs/day: 0.00    Types: Cigarettes    Start date: 05/15/1970    Quit date: 05/15/1974    Years since quitting: 49.8   Smokeless tobacco: Never   Tobacco comments:    quit 35-40 years ago  Vaping Use   Vaping status: Never Used  Substance  and Sexual Activity   Alcohol  use: Yes    Alcohol /week: 3.0 standard drinks of alcohol     Types: 3 Standard drinks or equivalent per week   Drug use: No   Sexual activity: Not Currently    Birth control/protection: Post-menopausal  Other Topics Concern   Not on file  Social History Narrative   Not on file   Social Drivers of Health   Financial Resource Strain: Not on file  Food Insecurity: Not on file  Transportation Needs: Not on file  Physical Activity: Not on file  Stress: Not on file  Social  Connections: Not on file     Family History: The patient's family history includes Brain cancer in her mother; Breast cancer in her paternal aunt and paternal aunt; Diabetes in her maternal grandmother; Heart attack in her maternal grandmother; Hypertension in her brother, mother, sister, and sister; Melanoma in her paternal aunt; Multiple myeloma in her paternal aunt; Other (age of onset: 83) in her daughter; Thyroid  disease in her brother and sister.  ROS:   Please see the history of present illness.    ROS  All other systems reviewed and negative.   EKGs/Labs/Other Studies Reviewed:    The following studies were reviewed today: PAP compliance download, outside labs on [facility]  Recent Labs: No results found for requested labs within last 365 days.   Recent Lipid Panel No results found for: CHOL, TRIG, HDL, CHOLHDL, VLDL, LDLCALC, LDLDIRECT  Physical Exam:    VS:  BP 138/70   Pulse 79   Ht 5' 4 (1.626 m)   Wt 175 lb 6.4 oz (79.6 kg)   LMP 11/21/2010 Comment: spotting-had hysteroscopy/d&c  SpO2 98%   BMI 30.11 kg/m     Wt Readings from Last 3 Encounters:  03/11/24 175 lb 6.4 oz (79.6 kg)  01/23/24 170 lb (77.1 kg)  03/10/23 189 lb 6.4 oz (85.9 kg)   GEN: Well nourished, well developed in no acute distress HEENT: Normal NECK: No JVD; No carotid bruits LYMPHATICS: No lymphadenopathy CARDIAC:RRR, no rubs, gallops 1/6 SM at RUSB RESPIRATORY:  Clear to  auscultation without rales, wheezing or rhonchi  ABDOMEN: Soft, non-tender, non-distended MUSCULOSKELETAL:  trace edema; No deformity  SKIN: Warm and dry NEUROLOGIC:  Alert and oriented x 3 PSYCHIATRIC:  Normal affect   ASSESSMENT:    1. OSA (obstructive sleep apnea)   2. Benign essential HTN   3. Persistent atrial fibrillation (HCC)   4. SVT (supraventricular tachycardia) (HCC)   5. Tachycardia-bradycardia syndrome (HCC)      PLAN:    In order of problems listed above:  OSA - The patient is tolerating PAP therapy well without any problems. The PAP download performed by his DME was personally reviewed and interpreted by me today and showed an AHI of 0.5 /hr on auto CPAP from 4-18 cm H2O with 23 % compliance in using more than 4 hours nightly.  The patient has been using and benefiting from PAP use and will continue to benefit from therapy.  -she uses her CPAP everynight but her compliance says she only used 19/30 days -her device is over 73 years old -I will order a new ResMed auto CPAP from 4 to 15cm H2O with heated humidity and mask of choice  -followup with me in 6 weeks after getting new device   HTN -BP is controlled on exam today -Continue Cardizem  CD 120 mg daily, HCTZ 25 mg daily, losartan 25 mg daily with as needed refills -will get a copy of BMET from PCP  PAF/SVT -s/p SVT ablation remotely -Remains in normal sinus rhythm today and denies any recent palpitations -Denies any bleeding problems on warfarin -Continue warfarin per pharmacy, Tambocor  100 mg twice daily and Cardizem  CD 120 mg daily with as needed refills -will get a copy of CBC from PCP  Tachy/Brady syndrome -s/p PPM -followed in device clinic  Heart Murmur -will check 2D echo  Atypical Chest pain -this is very atypical and only lasts a second and is sharp in her breast -nonexertional and she can work out in the yard with no problems -this  does not sound cardiac -she will let me know if it becomes  more prolonged or frequent -I will get an ETT to rule out ischemia -Informed Consent   Shared Decision Making/Informed Consent The risks [chest pain, shortness of breath, cardiac arrhythmias, dizziness, blood pressure fluctuations, myocardial infarction, stroke/transient ischemic attack, and life-threatening complications (estimated to be 1 in 10,000)], benefits (risk stratification, diagnosing coronary artery disease, treatment guidance) and alternatives of an exercise tolerance test were discussed in detail with [name] and she agrees to proceed.      Medication Adjustments/Labs and Tests Ordered: Current medicines are reviewed at length with the patient today.  Concerns regarding medicines are outlined above.  No orders of the defined types were placed in this encounter.  No orders of the defined types were placed in this encounter.   Signed, Wilbert Bihari, MD  03/11/2024 9:25 AM    [facility]

## 2024-03-11 NOTE — Addendum Note (Signed)
 Addended by: SHLOMO WILBERT SAUNDERS on: 03/11/2024 01:48 PM   Modules accepted: Orders

## 2024-03-11 NOTE — Patient Instructions (Signed)
 Medication Instructions:  Your physician recommends that you continue on your current medications as directed. Please refer to the Current Medication list given to you today.  *If you need a refill on your cardiac medications before your next appointment, please call your pharmacy*  Lab Work: Please request that your PCP fax us  a copy of your recent CBC/BMET. Our fax # is [phone].  If you have labs (blood work) drawn today and your tests are completely normal, you will receive your results only by: MyChart Message (if you have MyChart) OR A paper copy in the mail If you have any lab test that is abnormal or we need to change your treatment, we will call you to review the results.  Testing/Procedures: Your physician has requested that you have an echocardiogram. Echocardiography is a painless test that uses sound waves to create images of your heart. It provides your doctor with information about the size and shape of your heart and how well your heart's chambers and valves are working. This procedure takes approximately one hour. There are no restrictions for this procedure. Please do NOT wear cologne, perfume, aftershave, or lotions (deodorant is allowed). Please arrive 15 minutes prior to your appointment time.  Please note: We ask at that you not bring children with you during ultrasound (echo/ vascular) testing. Due to room size and safety concerns, children are not allowed in the ultrasound rooms during exams. Our front office staff cannot provide observation of children in our lobby area while testing is being conducted. An adult accompanying a patient to their appointment will only be allowed in the ultrasound room at the discretion of the ultrasound technician under special circumstances. We apologize for any inconvenience.   [facility] at [facility] [address] [place], [place] [address] Phone: [phone]  March 11, 2024      Courtney Dunlap DOB: [birth year] MRN: [id number] [address] [place] [address]   Dear Courtney Dunlap,   You are scheduled for an Exercise Stress Test on ______ at _______________  Please arrive 15 minutes prior to your appointment time for registration and insurance purposes.  The test will take approximately 45 minutes to complete.  How to prepare for your Exercise Stress Test: Do bring a list of your current medications with you.  If not listed below, you may take your medications as normal. Do wear comfortable clothes (no dresses or overalls) and walking shoes, tennis shoes preferred (no heels or open toed shoes are allowed) Do Not wear cologne, perfume, aftershave or lotions (deodorant is allowed). Please report to [address] for your test.  If these instructions are not followed, your test will have to be rescheduled.  If you have questions or concerns about your appointment, you can call the Stress Lab at [phone].  If you cannot keep your appointment, please provide 24 hours notification to the Stress Lab, to avoid a possible $50 charge to your account.  Follow-Up: At [facility], you and your health needs are our priority.  As part of our continuing mission to provide you with exceptional heart care, our providers are all part of one team.  This team includes your primary Cardiologist (physician) and Advanced Practice Providers or APPs (Physician Assistants and Nurse Practitioners) who all work together to provide you with the care you need, when you need it.  Your next appointment will be dependent on delivery of your new cpap equipment and it will be with:  Provider:   Wilbert Bihari, MD    We recommend signing up for the patient portal called MyChart.  Sign up information is provided on this After Visit Summary.  MyChart is used to connect with patients for Virtual Visits (Telemedicine).  Patients are able to view lab/test results, encounter notes,  upcoming appointments, etc.  Non-urgent messages can be sent to your provider as well.   To learn more about what you can do with MyChart, go to [email or web address].

## 2024-03-17 DIAGNOSIS — K921 Melena: Secondary | ICD-10-CM | POA: Diagnosis not present

## 2024-03-24 ENCOUNTER — Telehealth: Payer: Self-pay | Admitting: *Deleted

## 2024-03-24 DIAGNOSIS — I4819 Other persistent atrial fibrillation: Secondary | ICD-10-CM

## 2024-03-24 DIAGNOSIS — I10 Essential (primary) hypertension: Secondary | ICD-10-CM

## 2024-03-24 DIAGNOSIS — G4733 Obstructive sleep apnea (adult) (pediatric): Secondary | ICD-10-CM

## 2024-03-24 NOTE — Telephone Encounter (Signed)
-----   Message from Wilbert Bihari sent at 03/11/2024  9:29 AM EDT ----- order a new ResMed auto CPAP from 4 to 15cm H2O with heated humidity and mask of choice

## 2024-03-24 NOTE — Telephone Encounter (Signed)
 Per Dr Shlomo, Order placed to [facility] to order a new ResMed auto CPAP from 4 to 15cm H2O with heated humidity and mask of choice.

## 2024-03-26 ENCOUNTER — Other Ambulatory Visit: Payer: Self-pay | Admitting: Cardiology

## 2024-03-26 DIAGNOSIS — I480 Paroxysmal atrial fibrillation: Secondary | ICD-10-CM

## 2024-03-27 ENCOUNTER — Other Ambulatory Visit: Payer: Self-pay | Admitting: Cardiology

## 2024-03-27 DIAGNOSIS — I480 Paroxysmal atrial fibrillation: Secondary | ICD-10-CM

## 2024-03-31 DIAGNOSIS — Z7901 Long term (current) use of anticoagulants: Secondary | ICD-10-CM | POA: Diagnosis not present

## 2024-03-31 DIAGNOSIS — I480 Paroxysmal atrial fibrillation: Secondary | ICD-10-CM | POA: Diagnosis not present

## 2024-04-08 DIAGNOSIS — G4733 Obstructive sleep apnea (adult) (pediatric): Secondary | ICD-10-CM | POA: Diagnosis not present

## 2024-04-08 NOTE — Progress Notes (Signed)
 Remote pacemaker transmission.

## 2024-04-22 DIAGNOSIS — R638 Other symptoms and signs concerning food and fluid intake: Secondary | ICD-10-CM | POA: Diagnosis not present

## 2024-04-22 DIAGNOSIS — Z7901 Long term (current) use of anticoagulants: Secondary | ICD-10-CM | POA: Diagnosis not present

## 2024-04-22 DIAGNOSIS — I480 Paroxysmal atrial fibrillation: Secondary | ICD-10-CM | POA: Diagnosis not present

## 2024-04-22 DIAGNOSIS — R5383 Other fatigue: Secondary | ICD-10-CM | POA: Diagnosis not present

## 2024-04-22 DIAGNOSIS — Z1152 Encounter for screening for COVID-19: Secondary | ICD-10-CM | POA: Diagnosis not present

## 2024-04-22 DIAGNOSIS — J069 Acute upper respiratory infection, unspecified: Secondary | ICD-10-CM | POA: Diagnosis not present

## 2024-04-22 DIAGNOSIS — R058 Other specified cough: Secondary | ICD-10-CM | POA: Diagnosis not present

## 2024-04-27 ENCOUNTER — Ambulatory Visit: Payer: Medicare PPO | Attending: Cardiovascular Disease

## 2024-04-27 DIAGNOSIS — I480 Paroxysmal atrial fibrillation: Secondary | ICD-10-CM

## 2024-04-28 LAB — CUP PACEART REMOTE DEVICE CHECK
Battery Remaining Longevity: 100 mo
Battery Voltage: 2.99 V
Brady Statistic AP VP Percent: 0 %
Brady Statistic AP VS Percent: 80.48 %
Brady Statistic AS VP Percent: 0 %
Brady Statistic AS VS Percent: 19.52 %
Brady Statistic RA Percent Paced: 80.44 %
Brady Statistic RV Percent Paced: 0 %
Date Time Interrogation Session: 20250916010947
Implantable Lead Connection Status: 753985
Implantable Lead Connection Status: 753985
Implantable Lead Implant Date: 20190905
Implantable Lead Implant Date: 20190905
Implantable Lead Location: 753859
Implantable Lead Location: 753860
Implantable Lead Model: 3830
Implantable Lead Model: 5076
Implantable Pulse Generator Implant Date: 20190905
Lead Channel Impedance Value: 304 Ohm
Lead Channel Impedance Value: 361 Ohm
Lead Channel Impedance Value: 399 Ohm
Lead Channel Impedance Value: 437 Ohm
Lead Channel Pacing Threshold Amplitude: 1 V
Lead Channel Pacing Threshold Pulse Width: 0.4 ms
Lead Channel Sensing Intrinsic Amplitude: 1.625 mV
Lead Channel Sensing Intrinsic Amplitude: 1.625 mV
Lead Channel Sensing Intrinsic Amplitude: 6.375 mV
Lead Channel Sensing Intrinsic Amplitude: 6.375 mV
Lead Channel Setting Pacing Amplitude: 2 V
Lead Channel Setting Pacing Amplitude: 2.75 V
Lead Channel Setting Pacing Pulse Width: 0.4 ms
Lead Channel Setting Sensing Sensitivity: 0.9 mV
Zone Setting Status: 755011
Zone Setting Status: 755011

## 2024-04-29 ENCOUNTER — Encounter (HOSPITAL_COMMUNITY): Payer: Self-pay | Admitting: *Deleted

## 2024-04-29 DIAGNOSIS — Z7901 Long term (current) use of anticoagulants: Secondary | ICD-10-CM | POA: Diagnosis not present

## 2024-04-29 DIAGNOSIS — I480 Paroxysmal atrial fibrillation: Secondary | ICD-10-CM | POA: Diagnosis not present

## 2024-05-01 ENCOUNTER — Ambulatory Visit: Payer: Self-pay | Admitting: Cardiovascular Disease

## 2024-05-03 NOTE — Progress Notes (Signed)
 Remote PPM Transmission

## 2024-05-07 ENCOUNTER — Ambulatory Visit (HOSPITAL_COMMUNITY): Admission: RE | Admit: 2024-05-07 | Source: Ambulatory Visit

## 2024-05-07 ENCOUNTER — Ambulatory Visit (HOSPITAL_COMMUNITY)

## 2024-05-25 DIAGNOSIS — Z7901 Long term (current) use of anticoagulants: Secondary | ICD-10-CM | POA: Diagnosis not present

## 2024-05-25 DIAGNOSIS — Z23 Encounter for immunization: Secondary | ICD-10-CM | POA: Diagnosis not present

## 2024-05-25 DIAGNOSIS — I480 Paroxysmal atrial fibrillation: Secondary | ICD-10-CM | POA: Diagnosis not present

## 2024-05-26 DIAGNOSIS — M25511 Pain in right shoulder: Secondary | ICD-10-CM | POA: Diagnosis not present

## 2024-06-10 ENCOUNTER — Telehealth (HOSPITAL_COMMUNITY): Payer: Self-pay | Admitting: *Deleted

## 2024-06-10 NOTE — Telephone Encounter (Signed)
 ETT instructions left on pt's vm per dpr.

## 2024-06-14 ENCOUNTER — Ambulatory Visit: Admitting: Neurology

## 2024-06-14 ENCOUNTER — Encounter: Payer: Self-pay | Admitting: Neurology

## 2024-06-14 VITALS — BP 147/73 | HR 76 | Ht 62.0 in | Wt 175.0 lb

## 2024-06-14 DIAGNOSIS — G3184 Mild cognitive impairment, so stated: Secondary | ICD-10-CM

## 2024-06-14 DIAGNOSIS — F32A Depression, unspecified: Secondary | ICD-10-CM | POA: Diagnosis not present

## 2024-06-14 DIAGNOSIS — F4321 Adjustment disorder with depressed mood: Secondary | ICD-10-CM | POA: Diagnosis not present

## 2024-06-14 MED ORDER — CITALOPRAM HYDROBROMIDE 10 MG PO TABS: 10.0000 mg | ORAL_TABLET | Freq: Every day | ORAL | 3 refills | Status: AC

## 2024-06-14 NOTE — Patient Instructions (Signed)
 Continue current medications Start Celexa 10 mg daily Will send referral to psychology for depression and grief Will obtain dementia lab including B12, TSH, ATN profile at APOE4 genotype  Referral to formal neuropsychological testing Continue follow-up with primary care doctor Return in 1 year or sooner if worse  There are well-accepted and sensible ways to reduce risk for Alzheimers disease and other degenerative brain disorders .  Exercise Daily Walk A daily 20 minute walk should be part of your routine. Disease related apathy can be a significant roadblock to exercise and the only way to overcome this is to make it a daily routine and perhaps have a reward at the end (something your loved one loves to eat or drink perhaps) or a personal trainer coming to the home can also be very useful. Most importantly, the patient is much more likely to exercise if the caregiver / spouse does it with him/her. In general a structured, repetitive schedule is best.  General Health: Any diseases which effect your body will effect your brain such as a pneumonia, urinary infection, blood clot, heart attack or stroke. Keep contact with your primary care doctor for regular follow ups.  Sleep. A good nights sleep is healthy for the brain. Seven hours is recommended. If you have insomnia or poor sleep habits we can give you some instructions. If you have sleep apnea wear your mask.  Diet: Eating a heart healthy diet is also a good idea; fish and poultry instead of red meat, nuts (mostly non-peanuts), vegetables, fruits, olive oil or canola oil (instead of butter), minimal salt (use other spices to flavor foods), whole grain rice, bread, cereal and pasta and wine in moderation.Research is now showing that the MIND diet, which is a combination of The Mediterranean diet and the DASH diet, is beneficial for cognitive processing and longevity. Information about this diet can be found in The MIND Diet, a book by Annitta Feeling,  MS, RDN, and online at wildwildscience.es  Finances, Power of [address] and Advance Directives: You should consider putting legal safeguards in place with regard to financial and medical decision making. While the spouse always has power of attorney for medical and financial issues in the absence of any form, you should consider what you want in case the spouse / caregiver is no longer around or capable of making decisions.

## 2024-06-14 NOTE — Progress Notes (Signed)
 [facility]  PATIENT: Courtney Dunlap DOB: [birth year]  REQUESTING CLINICIAN: Nichole Senior, MD HISTORY FROM: Patient and both sister  REASON FOR VISIT: Memory Loss    HISTORICAL  CHIEF COMPLAINT:  Chief Complaint  Patient presents with   New Patient (Initial Visit)    Pt in room 13. Daughter lisa and michelle in room. Paper referral memory change. MOCA:22    HISTORY OF PRESENT ILLNESS:  Discussed the use of AI scribe software for clinical note transcription with the patient, who gave verbal consent to proceed.  Majestic C Schiraldi is a 73 year old female with atrial fibrillation, hypertension, heart disease, sleep apnea who presents with memory concerns. She is accompanied by her daughter, Rosaline and Olam.  She has experienced memory issues for many years, with a noted worsening after her sister's death two years ago. She forgets conversations shortly after they occur and requires reminders for appointments and daily tasks. Her husband and daughters have observed her repeating information and forgetting scheduled events. She uses lists to aid her memory and does not have issues with cooking, cleaning, or driving in familiar areas. However, she sometimes forgets family members' names, particularly her new grandson.  There is no frequent misplacement of items and no difficulty managing finances or medications. She uses a pillbox for her medications, which include Coumadin . She has a pacemaker for heart rate management. Her medical history includes breast cancer and sleep apnea, for which she uses a CPAP, atrial fibrillation for which she takes Coumadin . No history of stroke, seizures, or traumatic brain injury.  Her family history includes a brother diagnosed with dementia in his eighties. She has experienced significant family loss, including the death of her sister, grandson and nephew, contributing to her emotional distress. She feels anxious, particularly in the  car, and experiences frequent crying spells, often in private. She has not been formally diagnosed with depression or anxiety, but her daughter notes she has been resistant to taking medication for anxiety in the past.  She also reports some increase stress at home related to her husband increase irritability.  In terms of physical activity, she works in her yard but does not have a structured exercise routine. She experienced a fall last spring at her sister's house but did not sustain any injuries. She reports good sleep quality and denies any recent accidents while driving.    TBI:  No past history of TBI Stroke:   no past history of stroke Seizures:   no past history of seizures Sleep: Has sleep apnea, currently on CPAP Mood: Reports grief and depressive symptoms related to the death of her sister, grandson, and nephew.   Family history of Dementia: Brother in his 51  Functional status: independent in all ADLs and IADLs Patient lives with husband. Cooking: No issue Cleaning: No issues Shopping: No issues, sometimes will need a list Bathing: No issues Toileting: No issues Driving: lost while driving to unfamilair places  Bills: No issues Medications: No issues, will use a pillbox Ever left the stove on by accident?:  Denies Forget how to use items around the house?:  Denies Getting lost going to familiar places?:  Denies but got lost going to unfamiliar places Forgetting loved ones names?:  Denies Word finding difficulty?  Denies Sleep: Good   OTHER MEDICAL CONDITIONS: Atrial fibrillation, heart disease, obstructive sleep apnea, hypertension   REVIEW OF SYSTEMS: Full 14 system review of systems performed and negative with exception of: As noted in HPI  ALLERGIES:  Allergies  Allergen Reactions   Codeine Swelling    Facial/lips   Anastrozole      Other reaction(s): myalgias   Furosemide     Other reaction(s): rash on legs   Adhesive [Tape] Itching and Rash    MUST USE  PAPER TAPE-CAUSES BLISTERS AND BRUISES ALSO   Latex Itching and Rash    HOME MEDICATIONS: Outpatient Medications Prior to Visit  Medication Sig Dispense Refill   acetaminophen  (TYLENOL ) 500 MG tablet Take 500 mg by mouth every 6 (six) hours as needed for pain.     atorvastatin  (LIPITOR) 20 MG tablet Take 20 mg by mouth daily after breakfast.      cholecalciferol  (VITAMIN D ) 1000 units tablet Take 1,000 Units by mouth daily.     flecainide  (TAMBOCOR ) 100 MG tablet Take 1 tablet (100 mg total) by mouth 2 (two) times daily. 180 tablet 1   hydrochlorothiazide  (HYDRODIURIL ) 25 MG tablet Take 1 tablet (25 mg total) by mouth daily. Please call and schedule an appointment for further refills 2nd attempt 90 tablet 1   losartan (COZAAR) 25 MG tablet Take 25 mg by mouth daily.     Omega-3 Fatty Acids (FISH OIL) 1000 MG CAPS Take 1,000 mg by mouth daily.     vitamin B-12 (CYANOCOBALAMIN ) 1000 MCG tablet Take 1,000 mcg by mouth daily.     warfarin (COUMADIN ) 5 MG tablet Take 1.5 tablets (7.5 mg total) by mouth daily. Resume on 05/30/2016     diclofenac Sodium (VOLTAREN) 1 % GEL diclofenac 1 % topical gel  APPLY AA QID (Patient not taking: Reported on 06/14/2024)     diltiazem  (CARDIZEM  CD) 120 MG 24 hr capsule TAKE 1 CAPSULE(120 MG) BY MOUTH DAILY (Patient not taking: Reported on 06/14/2024) 90 capsule 1   triamcinolone  cream (KENALOG) 0.1 % as needed for rash. (Patient not taking: Reported on 06/14/2024)     No facility-administered medications prior to visit.    PAST MEDICAL HISTORY: Past Medical History:  Diagnosis Date   Allergic dermatitis    Anxiety    Arthritis    back, knees & ankles    Atrial flutter (HCC) 05/25/2016   Benign essential HTN 02/04/2014   Breast cancer (HCC)    Cancer (HCC)    skin- lower abdomen   Depression    Dyslipidemia 08/16/2011   Family history of breast cancer    Genetic testing 05/12/2015   Negative genetic testing on the Breast/Ovarian cancer panel.  The  Breast/Ovarian gene panel offered by [facility] includes sequencing and rearrangement analysis for the following 20 genes:  ATM, BARD1, BRCA1, BRCA2, BRIP1, CDH1, CHEK2, EPCAM, FANCC, MLH1, MSH2, MSH6, NBN, PALB2, PMS2, PTEN, RAD51C, RAD51D, TP53, and XRCC2.   The report date is May 11, 2015.    GERD (gastroesophageal reflux disease)    Hiatal hernia    with GERD   Hypercholesteremia    Hyperthyroidism    pt. reports that she thinks she has hypothyroidism, states [facility] took her off med. in the past 1-2 yrs.    Malignant neoplasm of upper-outer quadrant of right breast in female, estrogen receptor positive (HCC) 04/24/2015   Mild cognitive impairment with memory loss    Morbid obesity (HCC)    OSA (obstructive sleep apnea) 02/04/2014   Persistent atrial fibrillation (HCC)    Sleep apnea    severe with AHI 37/hr now on CPAP at 13cm H2O   SVT (supraventricular tachycardia)    likely due to atrial flutter.  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 Arnaldo   APPENDECTOMY  28 years ago   CATARACT EXTRACTION     right eye was June, left eye was July   DILATION AND CURETTAGE OF UTERUS  1996   Polyp resection    ELECTROPHYSIOLOGIC STUDY N/A 05/27/2016   Procedure: EPS/SVT Ablation;  Surgeon: Danelle LELON Waddell, MD;  Location: [facility] INVASIVE CV LAB;  Service: Cardiovascular;  Laterality: N/A;   HYSTEROSCOPY  11/2010   D&C   PACEMAKER IMPLANT N/A 04/16/2018   Medtronic Azure XT DR MRI SureScan PPM implanted by Dr Kelsie for afib with post termination pauses/ sick sinus syndrome   RADIOACTIVE SEED GUIDED PARTIAL MASTECTOMY WITH AXILLARY SENTINEL LYMPH NODE BIOPSY Right 05/18/2015   Procedure: RIGHT BREAST RADIOACTIVE SEED PARTIAL MASTECTOMY WITH RIGHT SENTINEL LYMPH NODE MAPPING;  Surgeon: Debby Shipper, MD;  Location: [facility] OR;   Service: General;  Laterality: Right;   TONSILLECTOMY     TUBAL LIGATION      FAMILY HISTORY: Family History  Problem Relation Age of Onset   Hypertension Mother    Brain cancer Mother    Hypertension Sister    Thyroid  disease Sister    Hypertension Sister    Hypertension Brother    Thyroid  disease Brother    Diabetes Maternal Grandmother    Heart attack Maternal Grandmother    Breast cancer Paternal Aunt        dx in her late 18s- early 63s   Melanoma Paternal Aunt    Breast cancer Paternal Aunt    Multiple myeloma Paternal Aunt    Other Daughter 48       ITP    SOCIAL HISTORY: Social History   Socioeconomic History   Marital status: Married    Spouse name: Not on file   Number of children: 2   Years of education: Not on file   Highest education level: Not on file  Occupational History    Employer: [employer]  Tobacco Use   Smoking status: Former    Current packs/day: 0.00    Types: Cigarettes    Start date: 05/15/1970    Quit date: 05/15/1974    Years since quitting: 50.1   Smokeless tobacco: Never   Tobacco comments:    quit 35-40 years ago  Vaping Use   Vaping status: Never Used  Substance and Sexual Activity   Alcohol  use: Yes    Alcohol /week: 3.0 standard drinks of alcohol     Types: 3 Standard drinks or equivalent per week   Drug use: No   Sexual activity: Not Currently    Birth control/protection: Post-menopausal  Other Topics Concern   Not on file  Social History Narrative   Not on file   Social Drivers of Health   Financial Resource Strain: Not on file  Food Insecurity: Not on file  Transportation Needs: Not on file  Physical Activity: Not on file  Stress: Not on file  Social Connections: Not on file  Intimate Partner Violence: Not on file    PHYSICAL EXAM  GENERAL EXAM/CONSTITUTIONAL: Vitals:  Vitals:   06/14/24 0922  BP: (!) 147/73  Pulse: 76  Weight: 175 lb (79.4 kg)  Height: 5' 2 (1.575 m)   Body mass index is  32.01 kg/m. Wt Readings from Last 3 Encounters:  06/14/24 175 lb (79.4 kg)  03/11/24 175 lb 6.4 oz (79.6 kg)  01/23/24 170 lb (77.1 kg)   Patient is in no distress; well developed, nourished and groomed; neck is supple  MUSCULOSKELETAL: Gait, strength, tone, movements noted in Neurologic exam below  NEUROLOGIC: MENTAL STATUS:      No data to display            06/14/2024    9:28 AM  Montreal Cognitive Assessment   Visuospatial/ Executive (0/5) 4  Naming (0/3) 3  Attention: Read list of digits (0/2) 2  Attention: Read list of letters (0/1) 1  Attention: Serial 7 subtraction starting at 100 (0/3) 3  Language: Repeat phrase (0/2) 1  Language : Fluency (0/1) 1  Abstraction (0/2) 2  Delayed Recall (0/5) 0  Orientation (0/6) 5  Total 22    awake, alert, oriented to person, place and time recent and remote memory intact normal attention and concentration language fluent, comprehension intact, naming intact fund of knowledge appropriate  CRANIAL NERVE:  2nd, 3rd, 4th, 6th- visual fields full to confrontation, extraocular muscles intact, no nystagmus 5th - facial sensation symmetric 7th - facial strength symmetric 8th - hearing intact 9th - palate elevates symmetrically, uvula midline 11th - shoulder shrug symmetric 12th - tongue protrusion midline  MOTOR:  normal bulk and tone, full strength in the BUE, BLE  SENSORY:  normal and symmetric to light touch  COORDINATION:  finger-nose-finger, fine finger movements normal  GAIT/STATION:  normal   DIAGNOSTIC DATA (LABS, IMAGING, TESTING) - I reviewed patient records, labs, notes, testing and imaging myself where available.  Lab Results  Component Value Date   WBC 6.3 03/10/2023   HGB 13.0 03/10/2023   HCT 39.0 03/10/2023   MCV 94 03/10/2023   PLT 244 03/10/2023      Component Value Date/Time   NA 143 03/10/2023 0830   NA 140 06/09/2017 1341   K 4.0 03/10/2023 0830   K 3.8 06/09/2017 1341   CL 100  03/10/2023 0830   CO2 25 03/10/2023 0830   CO2 28 06/09/2017 1341   GLUCOSE 87 03/10/2023 0830   GLUCOSE 101 (H) 06/21/2019 0952   GLUCOSE 94 06/09/2017 1341   BUN 9 03/10/2023 0830   BUN 13.9 06/09/2017 1341   CREATININE 0.62 03/10/2023 0830   CREATININE 0.78 06/21/2019 0952   CREATININE 0.7 06/09/2017 1341   CALCIUM  8.7 03/10/2023 0830   CALCIUM  9.7 06/09/2017 1341   PROT 7.8 06/21/2019 0952   PROT 7.6 06/09/2017 1341   ALBUMIN 4.0 06/21/2019 0952   ALBUMIN 3.8 06/09/2017 1341   AST 17 06/21/2019 0952   AST 27 06/09/2017 1341   ALT 19 06/21/2019 0952   ALT 34 06/09/2017 1341   ALKPHOS 166 (H) 06/21/2019 0952   ALKPHOS 157 (H) 06/09/2017 1341   BILITOT 0.6 06/21/2019 0952   BILITOT 0.69 06/09/2017 1341   GFRNONAA 86 07/22/2019 1104   GFRNONAA >60 06/21/2019 0952   GFRAA 100 07/22/2019 1104   GFRAA >60 06/21/2019 0952   No results found for: CHOL, HDL, LDLCALC, LDLDIRECT, TRIG, CHOLHDL No results found for: YHAJ8R No results found for: VITAMINB12 Lab Results  Component Value Date   TSH 1.153 08/24/2017    Head CT 12/24/2021 Negative CT of the head     ASSESSMENT AND PLAN  73 y.o. year old female with    Mild cognitive impairment MOCA score of 22, suggestive of mild cognitive impairment. Normal activity of daily living. Differential diagnosis includes depression, anxiety, and MCI due to Alzheimer's  disease. Family history of dementia. No significant impact on daily activities. Discussed potential causes including depression, anxiety, and Alzheimer's disease. Explained that mild cognitive impairment can be due to depression, anxiety, or early-stage dementia. Discussed the possibility of Alzheimer's disease and the availability of blood tests to check for biomarkers. Emphasized that dementia is a clinical diagnosis and that current diagnosis remains mild cognitive impairment as daily activities are intact. - Ordered blood tests for Alzheimer's  biomarkers - Referred to neuropsychiatrist for further evaluation - Continue monitoring cognitive function and daily activities  Depression and anxiety Likely contributing to mild cognitive impairment. Symptoms include anxiety and emotional distress due to recent family losses. Discussed the potential benefit of treating depression and anxiety to improve cognitive function. Explained that medication and therapy can help manage symptoms. Discussed the use of citalopram (Celexa) for depression and anxiety, emphasizing its tolerability in the elderly. Explained that side effects are possible but not guaranteed and advised against hypervigilance about them. Discussed the importance of therapy in conjunction with medication for optimal results. - Prescribed citalopram 10 mg daily for 30 days with 3 refills - Referred to therapist for counseling - Monitor response to citalopram and adjust treatment as needed     1. Mild cognitive impairment   2. Grief   3. Depression, unspecified depression type      Patient Instructions  Continue current medications Start Celexa 10 mg daily Will send referral to psychology for depression and grief Will obtain dementia lab including B12, TSH, ATN profile at APOE4 genotype  Referral to formal neuropsychological testing Continue follow-up with primary care doctor Return in 1 year or sooner if worse  There are well-accepted and sensible ways to reduce risk for Alzheimers disease and other degenerative brain disorders .  Exercise Daily Walk A daily 20 minute walk should be part of your routine. Disease related apathy can be a significant roadblock to exercise and the only way to overcome this is to make it a daily routine and perhaps have a reward at the end (something your loved one loves to eat or drink perhaps) or a personal trainer coming to the home can also be very useful. Most importantly, the patient is much more likely to exercise if the caregiver /  spouse does it with him/her. In general a structured, repetitive schedule is best.  General Health: Any diseases which effect your body will effect your brain such as a pneumonia, urinary infection, blood clot, heart attack or stroke. Keep contact with your primary care doctor for regular follow ups.  Sleep. A good nights sleep is healthy for the brain. Seven hours is recommended. If you have insomnia or poor sleep habits we can give you some instructions. If you have sleep apnea wear your mask.  Diet: Eating a heart healthy diet is also a good idea; fish and poultry instead of red meat, nuts (mostly non-peanuts), vegetables, fruits, olive oil or canola oil (instead of butter), minimal salt (use other spices to flavor foods), whole grain rice, bread, cereal and pasta and wine in moderation.Research is now showing that the MIND diet, which is a combination of The Mediterranean diet and the DASH diet, is beneficial for cognitive processing and longevity. Information about this diet can be found in The MIND Diet, a book by Annitta Feeling, MS, RDN, and online at wildwildscience.es  Finances, Power of [address] and Advance Directives: You should consider putting legal safeguards in place with regard to financial and medical decision making. While the spouse always  has power of attorney for medical and financial issues in the absence of any form, you should consider what you want in case the spouse / caregiver is no longer around or capable of making decisions.     Orders Placed This Encounter  Procedures   Vitamin B12   TSH   ATN PROFILE   APOE Alzheimer's Risk   Ambulatory referral to Psychology   Ambulatory referral to Neuropsychology    Meds ordered this encounter  Medications   citalopram (CELEXA) 10 MG tablet    Sig: Take 1 tablet (10 mg total) by mouth daily.    Dispense:  30 tablet    Refill:  3    Return in about 1 year (around 06/14/2025).  I have spent a  total of 65 minutes dedicated to this patient today, preparing to see patient, performing a medically appropriate examination and evaluation, ordering tests and/or medications and procedures, and counseling and educating the patient/family/caregiver; independently interpreting result and communicating results to the family/patient/caregiver; and documenting clinical information in the electronic medical record.   Pastor Falling, MD 06/14/2024, 11:04 AM  [facility] [address], [address] [place], [place] [address] [phone]

## 2024-06-16 ENCOUNTER — Telehealth: Payer: Self-pay | Admitting: Neurology

## 2024-06-16 NOTE — Telephone Encounter (Signed)
Referral for psychology fax to [facility]. Phone: [phone], Fax: [phone].

## 2024-06-17 ENCOUNTER — Ambulatory Visit (HOSPITAL_COMMUNITY): Admission: RE | Admit: 2024-06-17 | Source: Ambulatory Visit

## 2024-06-17 ENCOUNTER — Other Ambulatory Visit: Payer: Self-pay

## 2024-06-17 ENCOUNTER — Ambulatory Visit (HOSPITAL_BASED_OUTPATIENT_CLINIC_OR_DEPARTMENT_OTHER)
Admission: RE | Admit: 2024-06-17 | Discharge: 2024-06-17 | Disposition: A | Discharge status: Home / Self Care | Source: Ambulatory Visit | Attending: Cardiovascular Disease | Admitting: Cardiovascular Disease

## 2024-06-17 ENCOUNTER — Emergency Department (HOSPITAL_COMMUNITY)
Admission: EM | Admit: 2024-06-17 | Discharge: 2024-06-17 | Disposition: A | Discharge status: Home / Self Care | Attending: Emergency Medicine | Admitting: Emergency Medicine

## 2024-06-17 ENCOUNTER — Ambulatory Visit: Payer: Self-pay | Admitting: Cardiology

## 2024-06-17 ENCOUNTER — Encounter: Payer: Self-pay | Admitting: Cardiology

## 2024-06-17 ENCOUNTER — Ambulatory Visit (HOSPITAL_COMMUNITY)

## 2024-06-17 ENCOUNTER — Emergency Department (HOSPITAL_COMMUNITY)

## 2024-06-17 DIAGNOSIS — R011 Cardiac murmur, unspecified: Secondary | ICD-10-CM

## 2024-06-17 DIAGNOSIS — Z79899 Other long term (current) drug therapy: Secondary | ICD-10-CM | POA: Insufficient documentation

## 2024-06-17 DIAGNOSIS — R0789 Other chest pain: Secondary | ICD-10-CM | POA: Diagnosis present

## 2024-06-17 DIAGNOSIS — R457 State of emotional shock and stress, unspecified: Secondary | ICD-10-CM | POA: Diagnosis not present

## 2024-06-17 DIAGNOSIS — R079 Chest pain, unspecified: Secondary | ICD-10-CM | POA: Diagnosis not present

## 2024-06-17 DIAGNOSIS — Z91040 Latex allergy status: Secondary | ICD-10-CM | POA: Insufficient documentation

## 2024-06-17 DIAGNOSIS — Z950 Presence of cardiac pacemaker: Secondary | ICD-10-CM | POA: Insufficient documentation

## 2024-06-17 DIAGNOSIS — I340 Nonrheumatic mitral (valve) insufficiency: Secondary | ICD-10-CM | POA: Insufficient documentation

## 2024-06-17 DIAGNOSIS — I4510 Unspecified right bundle-branch block: Secondary | ICD-10-CM | POA: Diagnosis not present

## 2024-06-17 LAB — TROPONIN I (HIGH SENSITIVITY)
Troponin I (High Sensitivity): 4 ng/L (ref <18)
Troponin I (High Sensitivity): 5 ng/L (ref <18)

## 2024-06-17 LAB — CBC
HCT: 37.9 % (ref 36.0–46.0)
Hemoglobin: 12.6 g/dL (ref 12.0–15.0)
MCH: 31.7 pg (ref 26.0–34.0)
MCHC: 33.2 g/dL (ref 30.0–36.0)
MCV: 95.2 fL (ref 80.0–100.0)
Platelets: 218 K/uL (ref 150–400)
RBC: 3.98 MIL/uL (ref 3.87–5.11)
RDW: 13 % (ref 11.5–15.5)
WBC: 5.5 K/uL (ref 4.0–10.5)
nRBC: 0 % (ref 0.0–0.2)

## 2024-06-17 LAB — ECHOCARDIOGRAM COMPLETE
Area-P 1/2: 4.53 cm2
MV M vel: 6.18 m/s
MV Peak grad: 152.8 mmHg
MV VTI: 2.27 cm2
Radius: 0.9 cm
S' Lateral: 3.2 cm

## 2024-06-17 LAB — BASIC METABOLIC PANEL WITH GFR
Anion gap: 12 (ref 5–15)
BUN: 9 mg/dL (ref 8–23)
CO2: 24 mmol/L (ref 22–32)
Calcium: 9.2 mg/dL (ref 8.9–10.3)
Chloride: 99 mmol/L (ref 98–111)
Creatinine, Ser: 0.7 mg/dL (ref 0.44–1.00)
GFR, Estimated: >60 mL/min (ref >60)
Glucose, Bld: 105 mg/dL — ABNORMAL HIGH (ref 70–99)
Potassium: 3.6 mmol/L (ref 3.5–5.1)
Sodium: 135 mmol/L (ref 135–145)

## 2024-06-17 NOTE — Discharge Instructions (Addendum)
 Please follow-up with Dr. Shlomo

## 2024-06-17 NOTE — ED Triage Notes (Addendum)
 Pt bib [facility] from home with complaints of centralized chest pain that woke her up out of her sleep. Pain started in her left arm and radiated to chest.  Pt given 1 nitroglycerin  and 324mg  asa en route. Pain went from 10/10 to 4/10 and bp went from 220/140 to 170/96 after NTG.

## 2024-06-17 NOTE — ED Provider Notes (Signed)
 [facility] AT [facility] Provider Note   CSN: [id number] Arrival date & time: 06/17/24  9784     Patient presents with: Chest Pain   Courtney Dunlap is a 73 y.o. female.   Patient presents to the emergency department for evaluation of chest pain.  Patient was laying in bed, not quite asleep when she had onset of tingling in her left arm followed by some pain in the left upper chest.  She was noted to have high blood pressure, comes to the ED by ambulance.  She was given aspirin  and a sublingual nitroglycerin , reports improvement.       Prior to Admission medications   Medication Sig Start Date End Date Taking? Authorizing Provider  acetaminophen  (TYLENOL ) 500 MG tablet Take 500 mg by mouth every 6 (six) hours as needed for pain.    [provider]  atorvastatin  (LIPITOR) 20 MG tablet Take 20 mg by mouth daily after breakfast.  07/27/13   [provider]  cholecalciferol  (VITAMIN D ) 1000 units tablet Take 1,000 Units by mouth daily.    [provider]  citalopram (CELEXA) 10 MG tablet Take 1 tablet (10 mg total) by mouth daily. 06/14/24   Camara, Amadou, MD  diclofenac Sodium (VOLTAREN) 1 % GEL diclofenac 1 % topical gel  APPLY AA QID Patient not taking: Reported on 06/14/2024    [provider]  diltiazem  (CARDIZEM  CD) 120 MG 24 hr capsule TAKE 1 CAPSULE(120 MG) BY MOUTH DAILY Patient not taking: Reported on 06/14/2024 03/29/24   Shlomo Wilbert SAUNDERS, MD  flecainide  (TAMBOCOR ) 100 MG tablet Take 1 tablet (100 mg total) by mouth 2 (two) times daily. 03/26/24   Shlomo Wilbert SAUNDERS, MD  hydrochlorothiazide  (HYDRODIURIL ) 25 MG tablet Take 1 tablet (25 mg total) by mouth daily. Please call and schedule an appointment for further refills 2nd attempt 06/01/19   Cleotilde Ronal RAMAN, MD  losartan (COZAAR) 25 MG tablet Take 25 mg by mouth daily. 12/19/22   [provider]  Omega-3 Fatty Acids (FISH OIL) 1000 MG CAPS Take 1,000 mg by  mouth daily.    [provider]  triamcinolone  cream (KENALOG) 0.1 % as needed for rash. Patient not taking: Reported on 06/14/2024 10/01/21   [provider]  vitamin B-12 (CYANOCOBALAMIN ) 1000 MCG tablet Take 1,000 mcg by mouth daily.    [provider]  warfarin (COUMADIN ) 5 MG tablet Take 1.5 tablets (7.5 mg total) by mouth daily. Resume on 05/30/2016 05/28/16   Kathrine Jeoffrey POUR, NP    Allergies: Codeine, Anastrozole , Furosemide, Adhesive [tape], and Latex    Review of Systems  Updated Vital Signs BP (!) 153/78   Pulse 78   Temp 98.1 F (36.7 C) (Oral)   Resp (!) 22   LMP 11/21/2010 Comment: spotting-had hysteroscopy/d&c  SpO2 98%   Physical Exam Vitals and nursing note reviewed.  Constitutional:      General: She is not in acute distress.    Appearance: She is well-developed.  HENT:     Head: Normocephalic and atraumatic.     Mouth/Throat:     Mouth: Mucous membranes are moist.  Eyes:     General: Vision grossly intact. Gaze aligned appropriately.     Extraocular Movements: Extraocular movements intact.     Conjunctiva/sclera: Conjunctivae normal.  Cardiovascular:     Rate and Rhythm: Normal rate and regular rhythm.     Pulses: Normal pulses.     Heart sounds: Normal heart sounds, S1  normal and S2 normal. No murmur heard.    No friction rub. No gallop.  Pulmonary:     Effort: Pulmonary effort is normal. No respiratory distress.     Breath sounds: Normal breath sounds.  Abdominal:     General: Bowel sounds are normal.     Palpations: Abdomen is soft.     Tenderness: There is no abdominal tenderness. There is no guarding or rebound.     Hernia: No hernia is present.  Musculoskeletal:        General: No swelling.     Cervical back: Full passive range of motion without pain, normal range of motion and neck supple. No spinous process tenderness or muscular tenderness. Normal range of motion.     Right lower leg: No edema.     Left lower leg:  No edema.  Skin:    General: Skin is warm and dry.     Capillary Refill: Capillary refill takes less than 2 seconds.     Findings: No ecchymosis, erythema, rash or wound.  Neurological:     General: No focal deficit present.     Mental Status: She is alert and oriented to person, place, and time.     GCS: GCS eye subscore is 4. GCS verbal subscore is 5. GCS motor subscore is 6.     Cranial Nerves: Cranial nerves 2-12 are intact.     Sensory: Sensation is intact.     Motor: Motor function is intact.     Coordination: Coordination is intact.  Psychiatric:        Attention and Perception: Attention normal.        Mood and Affect: Mood normal.        Speech: Speech normal.        Behavior: Behavior normal.     (all labs ordered are listed, but only abnormal results are displayed) Labs Reviewed  BASIC METABOLIC PANEL WITH GFR - Abnormal; Notable for the following components:      Result Value   Glucose, Bld 105 (*)    All other components within normal limits  CBC  TROPONIN I (HIGH SENSITIVITY)  TROPONIN I (HIGH SENSITIVITY)    EKG: EKG Interpretation Date/Time:  Thursday June 17 2024 02:31:51 EST Ventricular Rate:  80 PR Interval:  162 QRS Duration:  166 QT Interval:  454 QTC Calculation: 523 R Axis:   4  Text Interpretation: Normal sinus rhythm Right bundle branch block Abnormal ECG When compared with ECG of 23-Jan-2024 12:18, No acute changes Confirmed by Haze Lonni PARAS [phone]) on 06/17/2024 4:22:41 AM  Radiology: [facility] Chest 2 View Result Date: 06/17/2024 CLINICAL DATA:  Chest pain. EXAM: CHEST - 2 VIEW COMPARISON:  April 17, 2018 FINDINGS: There is stable dual lead AICD positioning. The heart size and mediastinal contours are within normal limits. Moderate severity calcification of the aortic arch is noted. The lungs are hyperinflated. No acute infiltrate, pleural effusion or pneumothorax is identified. Multilevel degenerative changes are present throughout  the thoracic spine. IMPRESSION: No active cardiopulmonary disease. Electronically Signed   By: Suzen Dials M.D.   On: 06/17/2024 03:57     Procedures   Medications Ordered in the ED - No data to display                                  Medical Decision Making Amount and/or Complexity of Data Reviewed External Data Reviewed: ECG and notes.  Details: Saw Dr. Shlomo in July.  Noted to have atypical, likely noncardiac chest pain at that visit, scheduled for outpatient exercise stress test and echo.  Coincidentally, these are scheduled for later today. Labs: ordered. Decision-making details documented in ED Course. Radiology: ordered and independent interpretation performed. Decision-making details documented in ED Course. ECG/medicine tests: ordered and independent interpretation performed. Decision-making details documented in ED Course.   Differential Diagnosis considered includes, but not limited to: STEMI; NSTEMI; myocarditis; pericarditis; pulmonary embolism; aortic dissection; pneumothorax; pneumonia; gastritis; musculoskeletal pain  Patient presents to the emergency department for evaluation of chest pain.  Patient does not have any known history of coronary artery disease.  She does have a history of paroxysmal atrial fibrillation and has had a pacemaker placed secondary to pauses and near syncope when she converts back to sinus rhythm.  Pacer interrogation has been performed.  EKG unchanged from prior, no ischemia or ST elevations.  Troponin negative x 2.  Patient's blood pressure has remained normal here in the ED.  Once again, symptoms do seem atypical.  She does not think that she can do her exercise tolerance test later today but I have encouraged her to try to get the echo to get some more information and follow-up with Dr. Shlomo, her cardiologist.  She does not require hospitalization at this time.     Final diagnoses:  Chest pain, unspecified type    ED  Discharge Orders     None          Haze Lonni PARAS, MD 06/17/24 [phone]

## 2024-06-21 ENCOUNTER — Other Ambulatory Visit: Payer: Self-pay

## 2024-06-21 DIAGNOSIS — I340 Nonrheumatic mitral (valve) insufficiency: Secondary | ICD-10-CM

## 2024-06-23 DIAGNOSIS — Z7901 Long term (current) use of anticoagulants: Secondary | ICD-10-CM | POA: Diagnosis not present

## 2024-06-23 DIAGNOSIS — I480 Paroxysmal atrial fibrillation: Secondary | ICD-10-CM | POA: Diagnosis not present

## 2024-06-24 ENCOUNTER — Ambulatory Visit: Payer: Self-pay | Admitting: Neurology

## 2024-06-24 LAB — ATN PROFILE
A -- Beta-amyloid 42/40 Ratio: 0.111 (ref >0.102)
Beta-amyloid 40: 167.49 pg/mL
Beta-amyloid 42: 18.59 pg/mL
N -- NfL, Plasma: 2.58 pg/mL (ref 0.00–6.04)
T -- p-tau181: 1.55 pg/mL — ABNORMAL HIGH (ref 0.00–0.97)

## 2024-06-24 LAB — APOE ALZHEIMER'S DISEASE RISK

## 2024-06-24 LAB — VITAMIN B12: Vitamin B-12: 811 pg/mL (ref 232–1245)

## 2024-06-24 LAB — TSH: TSH: 0.808 u[IU]/mL (ref 0.450–4.500)

## 2024-07-01 NOTE — Telephone Encounter (Signed)
 Call to patient to discuss echo results, no answer. Lvm per DPR asking patient to call our office.

## 2024-07-01 NOTE — Telephone Encounter (Signed)
-----   Message from Wilbert Bihari sent at 06/17/2024  9:07 PM EST ----- Echo showed normal heart function EF 65-705 with mildly enlarged LV and RV, severely enlarged LA, mild to moderately leaky MV - repeat echo in 1 year for MR ----- Message ----- From: Interface, [employer] Sent: 06/17/2024   5:52 PM EST To: Wilbert JONELLE Bihari, MD

## 2024-07-07 NOTE — Telephone Encounter (Signed)
-----   Message from Wilbert Bihari sent at 06/17/2024  9:07 PM EST ----- Echo showed normal heart function EF 65-705 with mildly enlarged LV and RV, severely enlarged LA, mild to moderately leaky MV - repeat echo in 1 year for MR ----- Message ----- From: Interface, [employer] Sent: 06/17/2024   5:52 PM EST To: Wilbert JONELLE Bihari, MD

## 2024-07-07 NOTE — Telephone Encounter (Signed)
 Call to patient to discuss echo results, no answer. No updated DPR on file for our practice. Left VM asking recipient to call [place] at our office #. Third attempt, letter sent.

## 2024-07-27 ENCOUNTER — Ambulatory Visit: Payer: Medicare PPO

## 2024-07-27 DIAGNOSIS — I340 Nonrheumatic mitral (valve) insufficiency: Secondary | ICD-10-CM | POA: Diagnosis not present

## 2024-07-29 LAB — CUP PACEART REMOTE DEVICE CHECK
Battery Remaining Longevity: 100 mo
Battery Voltage: 2.99 V
Brady Statistic AP VP Percent: 0 %
Brady Statistic AP VS Percent: 79.6 %
Brady Statistic AS VP Percent: 0 %
Brady Statistic AS VS Percent: 20.4 %
Brady Statistic RA Percent Paced: 79.55 %
Brady Statistic RV Percent Paced: 0 %
Date Time Interrogation Session: 20251216004951
Implantable Lead Connection Status: 753985
Implantable Lead Connection Status: 753985
Implantable Lead Implant Date: 20190905
Implantable Lead Implant Date: 20190905
Implantable Lead Location: 753859
Implantable Lead Location: 753860
Implantable Lead Model: 3830
Implantable Lead Model: 5076
Implantable Pulse Generator Implant Date: 20190905
Lead Channel Impedance Value: 285 Ohm
Lead Channel Impedance Value: 323 Ohm
Lead Channel Impedance Value: 399 Ohm
Lead Channel Impedance Value: 418 Ohm
Lead Channel Pacing Threshold Amplitude: 0.875 V
Lead Channel Pacing Threshold Pulse Width: 0.4 ms
Lead Channel Sensing Intrinsic Amplitude: 1.875 mV
Lead Channel Sensing Intrinsic Amplitude: 1.875 mV
Lead Channel Sensing Intrinsic Amplitude: 5.5 mV
Lead Channel Sensing Intrinsic Amplitude: 5.5 mV
Lead Channel Setting Pacing Amplitude: 1.75 V
Lead Channel Setting Pacing Amplitude: 2.75 V
Lead Channel Setting Pacing Pulse Width: 0.4 ms
Lead Channel Setting Sensing Sensitivity: 0.9 mV
Zone Setting Status: 755011
Zone Setting Status: 755011

## 2024-07-30 NOTE — Progress Notes (Signed)
 Remote PPM Transmission

## 2024-08-11 ENCOUNTER — Ambulatory Visit: Payer: Self-pay | Admitting: Cardiovascular Disease

## 2024-08-18 ENCOUNTER — Encounter (HOSPITAL_BASED_OUTPATIENT_CLINIC_OR_DEPARTMENT_OTHER): Payer: Self-pay

## 2024-10-07 ENCOUNTER — Ambulatory Visit (HOSPITAL_BASED_OUTPATIENT_CLINIC_OR_DEPARTMENT_OTHER): Admitting: Obstetrics & Gynecology

## 2025-06-20 ENCOUNTER — Ambulatory Visit: Admitting: Neurology
